# Patient Record
Sex: Male | Born: 1950 | Race: White | Hispanic: No | Smoking: Former smoker
Health system: Southern US, Community
[De-identification: ages and names within clinical notes are randomized; demographics above are authoritative.]

## PROBLEM LIST (undated history)

## (undated) DIAGNOSIS — I10 Essential (primary) hypertension: Secondary | ICD-10-CM

## (undated) DIAGNOSIS — J189 Pneumonia, unspecified organism: Secondary | ICD-10-CM

## (undated) DIAGNOSIS — K7030 Alcoholic cirrhosis of liver without ascites: Secondary | ICD-10-CM

## (undated) DIAGNOSIS — K7290 Hepatic failure, unspecified without coma: Secondary | ICD-10-CM

## (undated) HISTORY — PX: LIVER TRANSPLANT: SHX410

## (undated) HISTORY — PX: UMBILICAL HERNIA REPAIR: SHX196

---

## 2002-01-15 ENCOUNTER — Encounter (INDEPENDENT_AMBULATORY_CARE_PROVIDER_SITE_OTHER): Payer: Self-pay | Admitting: Specialist

## 2002-01-15 ENCOUNTER — Ambulatory Visit (HOSPITAL_COMMUNITY): Admission: RE | Admit: 2002-01-15 | Discharge: 2002-01-15 | Payer: Self-pay | Admitting: Gastroenterology

## 2009-04-23 ENCOUNTER — Encounter (HOSPITAL_COMMUNITY): Admission: RE | Admit: 2009-04-23 | Discharge: 2009-07-22 | Payer: Self-pay | Admitting: Family Medicine

## 2010-12-11 NOTE — Op Note (Signed)
[facility]  Patient:    Logan Patrick, Logan Patrick Visit Number: [id number] MRN: [id number]          Service Type: END Location: ENDO Attending Physician:  Rich Brave Dictated by:   Florencia Reasons, M.D. Proc. Date: 01/15/02 Admit Date:  01/15/2002   CC:         Delorse Lek, M.D.   Operative Report  PROCEDURE:  Colonoscopy with biopsies.  INDICATION:  This is a 60 year old gentleman for colon cancer screening.  FINDINGS:  Rare right-sided diverticulum, diminutive rectosigmoid polyps.  PROCEDURE:  The nature, purpose, risks of the procedure have been discussed with the patient who provided written consent. Digital examination of the prostate was normal. The Olympus adult video colonoscope was easily advanced to the cecum as identified by visualization of the ileocecal valve and appendiceal orifice, and pullback was then performed. The quality of the prep was very good except for a little bit of stool film coating some of the proximal colon which is not felt to likely have obscured any significant lesions.  There were 3 diminutive (2-4 mm) sessile, hyperplastic-appearing polyps in the rectosigmoid, from roughly 5 cm from the external anal opening up to 20 cm from the external anal opening, each removed by several cold biopsies. No large polyps, cancer, colitis, or vascular malformations were observed. There was some minimal right-sided diverticulosis.  Retroflex from the rectum as well as reinspection of the rectosigmoid disclosed no additional findings. The patient tolerated the procedure well, and there were no apparent complications.  IMPRESSION:  Diminutive rectosigmoid polyps. Minimal right-sided diverticulosis.  PLAN:  Await pathology on polyps. Dictated by:   Florencia Reasons, M.D. Attending Physician:  Rich Brave DD:  01/15/02 TD:  01/16/02 Job: [id number]

## 2012-11-30 ENCOUNTER — Emergency Department (HOSPITAL_COMMUNITY): Payer: BC Managed Care – PPO

## 2012-11-30 ENCOUNTER — Encounter (HOSPITAL_COMMUNITY): Payer: Self-pay | Admitting: Emergency Medicine

## 2012-11-30 ENCOUNTER — Inpatient Hospital Stay (HOSPITAL_COMMUNITY)
Admission: EM | Admit: 2012-11-30 | Discharge: 2012-12-13 | DRG: 588 | Disposition: A | Discharge status: Skilled Nursing Facility | Payer: BC Managed Care – PPO | Attending: Internal Medicine | Admitting: Internal Medicine

## 2012-11-30 DIAGNOSIS — D696 Thrombocytopenia, unspecified: Secondary | ICD-10-CM | POA: Diagnosis present

## 2012-11-30 DIAGNOSIS — N179 Acute kidney failure, unspecified: Secondary | ICD-10-CM | POA: Diagnosis present

## 2012-11-30 DIAGNOSIS — J209 Acute bronchitis, unspecified: Principal | ICD-10-CM | POA: Diagnosis present

## 2012-11-30 DIAGNOSIS — R188 Other ascites: Secondary | ICD-10-CM | POA: Diagnosis present

## 2012-11-30 DIAGNOSIS — Z87891 Personal history of nicotine dependence: Secondary | ICD-10-CM

## 2012-11-30 DIAGNOSIS — N289 Disorder of kidney and ureter, unspecified: Secondary | ICD-10-CM | POA: Diagnosis not present

## 2012-11-30 DIAGNOSIS — IMO0002 Reserved for concepts with insufficient information to code with codable children: Secondary | ICD-10-CM | POA: Diagnosis present

## 2012-11-30 DIAGNOSIS — J40 Bronchitis, not specified as acute or chronic: Secondary | ICD-10-CM | POA: Diagnosis present

## 2012-11-30 DIAGNOSIS — K7290 Hepatic failure, unspecified without coma: Secondary | ICD-10-CM | POA: Diagnosis present

## 2012-11-30 DIAGNOSIS — K7460 Unspecified cirrhosis of liver: Secondary | ICD-10-CM

## 2012-11-30 DIAGNOSIS — I10 Essential (primary) hypertension: Secondary | ICD-10-CM | POA: Diagnosis present

## 2012-11-30 DIAGNOSIS — D684 Acquired coagulation factor deficiency: Secondary | ICD-10-CM | POA: Diagnosis present

## 2012-11-30 DIAGNOSIS — G9340 Encephalopathy, unspecified: Secondary | ICD-10-CM | POA: Diagnosis present

## 2012-11-30 DIAGNOSIS — F1020 Alcohol dependence, uncomplicated: Secondary | ICD-10-CM | POA: Diagnosis present

## 2012-11-30 DIAGNOSIS — Z79899 Other long term (current) drug therapy: Secondary | ICD-10-CM

## 2012-11-30 DIAGNOSIS — K769 Liver disease, unspecified: Secondary | ICD-10-CM

## 2012-11-30 DIAGNOSIS — R319 Hematuria, unspecified: Secondary | ICD-10-CM | POA: Diagnosis present

## 2012-11-30 DIAGNOSIS — K7030 Alcoholic cirrhosis of liver without ascites: Secondary | ICD-10-CM | POA: Diagnosis present

## 2012-11-30 DIAGNOSIS — R627 Adult failure to thrive: Secondary | ICD-10-CM | POA: Diagnosis present

## 2012-11-30 DIAGNOSIS — E876 Hypokalemia: Secondary | ICD-10-CM | POA: Diagnosis present

## 2012-11-30 DIAGNOSIS — R531 Weakness: Secondary | ICD-10-CM

## 2012-11-30 DIAGNOSIS — M79641 Pain in right hand: Secondary | ICD-10-CM

## 2012-11-30 DIAGNOSIS — D649 Anemia, unspecified: Secondary | ICD-10-CM | POA: Diagnosis present

## 2012-11-30 HISTORY — DX: Essential (primary) hypertension: I10

## 2012-11-30 HISTORY — DX: Hepatic failure, unspecified without coma: K72.90

## 2012-11-30 HISTORY — DX: Bronchitis, not specified as acute or chronic: J40

## 2012-11-30 HISTORY — DX: Unspecified cirrhosis of liver: K74.60

## 2012-11-30 HISTORY — DX: Pneumonia, unspecified organism: J18.9

## 2012-11-30 LAB — CBC WITH DIFFERENTIAL/PLATELET
Basophils Relative: 0 % (ref 0–1)
Eosinophils Absolute: 0.2 10*3/uL (ref 0.0–0.7)
Eosinophils Relative: 2 % (ref 0–5)
HCT: 27.6 % — ABNORMAL LOW (ref 39.0–52.0)
Hemoglobin: 9.4 g/dL — ABNORMAL LOW (ref 13.0–17.0)
Lymphocytes Relative: 19 % (ref 12–46)
MCHC: 34.1 g/dL (ref 30.0–36.0)
Monocytes Relative: 10 % (ref 3–12)
Neutro Abs: 5.6 10*3/uL (ref 1.7–7.7)
Neutrophils Relative %: 69 % (ref 43–77)
RBC: 2.72 MIL/uL — ABNORMAL LOW (ref 4.22–5.81)
WBC: 8.1 10*3/uL (ref 4.0–10.5)

## 2012-11-30 LAB — PROTIME-INR
INR: 1.56 — ABNORMAL HIGH (ref 0.00–1.49)
Prothrombin Time: 18.2 seconds — ABNORMAL HIGH (ref 11.6–15.2)

## 2012-11-30 LAB — URINALYSIS, ROUTINE W REFLEX MICROSCOPIC
Bilirubin Urine: NEGATIVE
Glucose, UA: NEGATIVE mg/dL
Specific Gravity, Urine: 1.013 (ref 1.005–1.030)
pH: 6 (ref 5.0–8.0)

## 2012-11-30 LAB — COMPREHENSIVE METABOLIC PANEL
ALT: 30 U/L (ref 0–53)
AST: 101 U/L — ABNORMAL HIGH (ref 0–37)
Albumin: 2.1 g/dL — ABNORMAL LOW (ref 3.5–5.2)
CO2: 25 mEq/L (ref 19–32)
Calcium: 9.6 mg/dL (ref 8.4–10.5)
GFR calc non Af Amer: 89 mL/min — ABNORMAL LOW (ref >90)
Sodium: 140 mEq/L (ref 135–145)
Total Protein: 6.7 g/dL (ref 6.0–8.3)

## 2012-11-30 LAB — APTT: aPTT: 35 seconds (ref 24–37)

## 2012-11-30 LAB — CG4 I-STAT (LACTIC ACID): Lactic Acid, Venous: 1.53 mmol/L (ref 0.5–2.2)

## 2012-11-30 LAB — POCT I-STAT TROPONIN I: Troponin i, poc: 0 ng/mL (ref 0.00–0.08)

## 2012-11-30 MED ORDER — LEVOFLOXACIN IN D5W 750 MG/150ML IV SOLN
750.0000 mg | INTRAVENOUS | Status: DC
  Administered 2012-12-01 – 2012-12-03 (×3): 750 mg via INTRAVENOUS
  Filled 2012-11-30 (×3): qty 150

## 2012-11-30 MED ORDER — POTASSIUM CHLORIDE CRYS ER 20 MEQ PO TBCR
20.0000 meq | EXTENDED_RELEASE_TABLET | Freq: Once | ORAL | Status: AC
  Administered 2012-11-30: 20 meq via ORAL
  Filled 2012-11-30: qty 1

## 2012-11-30 MED ORDER — ALBUTEROL SULFATE (5 MG/ML) 0.5% IN NEBU
5.0000 mg | INHALATION_SOLUTION | Freq: Once | RESPIRATORY_TRACT | Status: AC
  Administered 2012-11-30: 5 mg via RESPIRATORY_TRACT
  Filled 2012-11-30: qty 1

## 2012-11-30 MED ORDER — IPRATROPIUM BROMIDE 0.02 % IN SOLN
0.5000 mg | Freq: Once | RESPIRATORY_TRACT | Status: AC
  Administered 2012-11-30: 0.5 mg via RESPIRATORY_TRACT
  Filled 2012-11-30: qty 2.5

## 2012-11-30 NOTE — ED Notes (Signed)
Pt cannot void at this time.  Has been made aware of need for sample.

## 2012-11-30 NOTE — ED Notes (Signed)
Patient said, "my daughter decided she couldn't keep me."  The patient says he feels fine and denies any pain.  Patient does have severe bruising to his torso and it is documented in the physical diagram.  The patient does say he thinks it is from his liver failure.

## 2012-11-30 NOTE — ED Notes (Signed)
According to EMS, the patient was treated at [facility] for pneumonia and sent home.  The daughter advised EMS that he was weaker now and she wanted him to come here and be evaluated by a different facility.  The patient does have liver failure and has pitting edema to lower extremities.  Patient is jaundice and has bruising all over.  Patient denies SOB, but is coughing.  EMS auscultated lungs and did hear some congestion.

## 2012-11-30 NOTE — H&P (Signed)
[facility] History and Physical  Logan Patrick WUJ:[id number] DOB: 10-05-1950 DOA: 11/30/2012  Referring physician: Dr. Anitra Lauth. PCP: Default, Provider, MD   Chief Complaint: Was brought to the ER by patient's daughter as patient was found to be increasingly difficult to be managed in house.  HPI: Logan Patrick is a 62 y.o. male was brought in by daughter today because daughter found increasingly difficult to manage patient at home. Patient was recently admitted last month to [place] county and after discharge patient was brought by patient's daughter to her house. But since patient has been feeling weak and patient's daughter found it increasingly difficult to to manage him. Patient on exam was complaining of increasing productive cough over the last one week. Denies any fever chills nausea vomiting chest pain abdominal pain or diarrhea. Denies any blood in the stools. Has noticed some dark urine. 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 Patient states that since his last admission last month he has stopped taking alcohol. Patient will be admitted for further observation.  Review of Systems: As presented in the history of presenting illness, rest negative.  Past Medical History  Diagnosis Date  . Liver failure   . Hypertension   . Pneumonia    History reviewed. No pertinent past surgical history. Social History:  reports that he quit smoking about 36 years ago. He has never used smokeless tobacco. He reports that he does not drink alcohol or use illicit drugs. Lives at home. where does patient live-- Not sure. Can patient participate in ADLs?  No Known Allergies  Family History  Problem Relation Age of Onset  . Diabetes type II Mother       Prior to Admission medications   Medication Sig Start Date End Date Taking? Authorizing Provider  albuterol-ipratropium (COMBIVENT) 18-103 MCG/ACT inhaler Inhale 2 puffs into the lungs  every 6 (six) hours as needed for wheezing. For wheezing   Yes Historical Provider, MD  B Complex Vitamins (B COMPLEX-B12 PO) Take 1 tablet by mouth daily.   Yes Historical Provider, MD  clotrimazole (LOTRIMIN) 1 % cream Apply 1 application topically 2 (two) times daily. Apply to facial rash-avoid eyes   Yes Historical Provider, MD  Fluocinolone Acetonide 0.01 % OIL Place 5 drops into the left ear 2 (two) times daily.   Yes Historical Provider, MD  lactulose (CHRONULAC) 10 GM/15ML solution Take 30 g by mouth 3 (three) times daily.   Yes Historical Provider, MD  magnesium oxide (MAG-OX) 400 MG tablet Take 400 mg by mouth See admin instructions. Take 1 tab 2 times daily x5 days then 1 tab daily thereafter   Yes Historical Provider, MD  pantoprazole (PROTONIX) 40 MG tablet Take 40 mg by mouth 2 (two) times daily.   Yes Historical Provider, MD  potassium chloride (K-DUR) 10 MEQ tablet Take 10 mEq by mouth daily.   Yes Historical Provider, MD  rifaximin (XIFAXAN) 550 MG TABS Take 550 mg by mouth 2 (two) times daily.   Yes Historical Provider, MD   Physical Exam: Filed Vitals:   11/30/12 1925 11/30/12 2000 11/30/12 2008 11/30/12 2218  BP: 117/79 121/81  128/80  Pulse:  92    Temp: 98.4 F (36.9 C)     TempSrc: Oral     Resp: 17 21  16   SpO2: 97% 96% 100% 96%     General:  Well-developed and nourished.  Eyes: Mild icterus no pallor.  ENT: No discharge from ears eyes  nose or mouth.  Neck: No mass felt.  Cardiovascular: S1-S2 heard.  Respiratory: No rhonchi or crepitations.  Abdomen: Mildly distended. Soft. Bowel sounds present.  Skin: Ecchymotic areas on the chest.  Musculoskeletal: Bilateral lower extremity edema.  Psychiatric: Appears normal.  Neurologic: Alert awake oriented to time place and person. Moves all extremities.  Labs on Admission:  Basic Metabolic Panel:  Recent Labs Lab 11/30/12 1951  NA 140  K 3.3*  CL 107  CO2 25  GLUCOSE 114*  BUN 16  CREATININE  0.93  CALCIUM 9.6   Liver Function Tests:  Recent Labs Lab 11/30/12 1951  AST 101*  ALT 30  ALKPHOS 95  BILITOT 5.0*  PROT 6.7  ALBUMIN 2.1*   No results found for this basename: LIPASE, AMYLASE,  in the last 168 hours  Recent Labs Lab 11/30/12 1952  AMMONIA 40   CBC:  Recent Labs Lab 11/30/12 1951  WBC 8.1  NEUTROABS 5.6  HGB 9.4*  HCT 27.6*  MCV 101.5*  PLT 124*   Cardiac Enzymes: No results found for this basename: CKTOTAL, CKMB, CKMBINDEX, TROPONINI,  in the last 168 hours  BNP (last 3 results) No results found for this basename: PROBNP,  in the last 8760 hours CBG: No results found for this basename: GLUCAP,  in the last 168 hours  Radiological Exams on Admission: Dg Chest 2 View  11/30/2012  *RADIOLOGY REPORT*  Clinical Data:  62 year old male with cough, shortness of breath, jaundice.  CHEST - 2 VIEW  Comparison: 04/15/2009 and earlier.  Findings:  Lower lung volumes.  Mild streaky opacity at the lung bases most resembles atelectasis.  Cardiac size and mediastinal contours are within normal limits.  Visualized tracheal air column is within normal limits.  No pneumothorax, pulmonary edema or pleural effusion. No acute osseous abnormality identified.  IMPRESSION: Atelectasis, otherwise no acute cardiopulmonary abnormality.     Original Report Authenticated By: Erskine Speed, M.D.     Assessment/Plan Principal Problem:   Bronchitis Active Problems:   Cirrhosis of liver   Hematuria   Anemia   1. Bronchitis and hematuria - patient has been placed on empiric antibiotics. Check urine cultures. Patient may need further workup for his hematuria as outpatient. 2. Difficult home situation - social work consult has been requested. 3. Cirrhosis of liver with most likely secondary to alcoholism - patient does have bilateral lower extremity edema and jaundice all of which could be from cirrhosis. At this time I have requested Dopplers of the lower extremity to rule out  DVT. Patient may need Lasix and spironolactone. Closely follow metabolic panel. 4. Anemia - closely follow CBC. Except for mild hematuria patient does not complain of any obvious bleed.    Code Status: Full code.  Family Communication: None.  Disposition Plan: Admit to inpatient.    Johncharles Fusselman N. [facility] Pager [phone].  If 7PM-7AM, please contact night-coverage [email or web address] Password [facility] 11/30/2012, 11:39 PM

## 2012-11-30 NOTE — ED Provider Notes (Signed)
History     CSN: [id number]  Arrival date & time 11/30/12  1908   First MD Initiated Contact with Patient 11/30/12 1912      Chief Complaint  Patient presents with  . Pneumonia    (Consider location/radiation/quality/duration/timing/severity/associated sxs/prior treatment) HPI Comments: Patient states he is a chronic alcoholic last drink was about one month ago. He also notes that he has liver failure and states that he was recently hospitalized and another town in [place] and because he could not take care of himself was discharged to live with his daughter. He states his daughter cannot take care of him. Also he states he's developed a worsening cough and someone told him he had pneumonia.  Patient is a 62 y.o. male presenting with cough. The history is provided by the patient.  Cough Cough characteristics:  Non-productive Severity:  Moderate Onset quality:  Gradual Duration:  3 days Timing:  Constant Progression:  Worsening Chronicity:  New Smoker: no   Context comment:  States recently in the hospital somewhere else and d/ced home yesterday with daughter who possible took him to urgent care today and they ddx him with pna.  pt currently on no abx. Relieved by:  Nothing Worsened by:  Activity Ineffective treatments:  None tried Associated symptoms: shortness of breath   Associated symptoms: no chest pain, no chills, no fever and no wheezing   Associated symptoms comment:  Leg swelling   Past Medical History  Diagnosis Date  . Liver failure   . Hypertension   . Pneumonia     History reviewed. No pertinent past surgical history.  History reviewed. No pertinent family history.  History  Substance Use Topics  . Smoking status: Former Smoker    Quit date: 11/30/1976  . Smokeless tobacco: Never Used  . Alcohol Use: No      Review of Systems  Constitutional: Negative for fever and chills.  Respiratory: Positive for cough and shortness of breath. Negative  for wheezing.   Cardiovascular: Negative for chest pain.  All other systems reviewed and are negative.    Allergies  Review of patient's allergies indicates no known allergies.  Home Medications  No current outpatient prescriptions on file.  BP 117/79  Temp(Src) 98.4 F (36.9 C) (Oral)  Resp 17  SpO2 97%  Physical Exam  Nursing note and vitals reviewed. Constitutional: He is oriented to person, place, and time. He appears well-developed and well-nourished. No distress.  HENT:  Head: Normocephalic and atraumatic.  Mouth/Throat: Oropharynx is clear and moist.  Eyes: Conjunctivae and EOM are normal. Pupils are equal, round, and reactive to light. Scleral icterus is present.  Neck: Normal range of motion. Neck supple.  Cardiovascular: Normal rate, regular rhythm and intact distal pulses.   No murmur heard. Pulmonary/Chest: Effort normal. No respiratory distress. He has no decreased breath sounds. He has wheezes. He has rhonchi. He has no rales. He exhibits tenderness. He exhibits no crepitus.    A large ecchymosis in various stages covering the entire chest and upper abdomen  Abdominal: Soft. He exhibits no distension. There is no tenderness. There is no rebound and no guarding.  No significant ascites  Musculoskeletal: Normal range of motion. He exhibits edema. He exhibits no tenderness.  3+ pitting edema up to the midshin  Neurological: He is alert and oriented to person, place, and time.  Skin: Skin is warm and dry. No rash noted. No erythema.  Petechiae scattered over the lower and upper extremities. Jaundice of the  skin  Psychiatric: He has a normal mood and affect. His behavior is normal.    ED Course  Procedures (including critical care time)  Labs Reviewed  CBC WITH DIFFERENTIAL - Abnormal; Notable for the following:    RBC 2.72 (*)    Hemoglobin 9.4 (*)    HCT 27.6 (*)    MCV 101.5 (*)    MCH 34.6 (*)    RDW 21.5 (*)    Platelets 124 (*)    All other  components within normal limits  COMPREHENSIVE METABOLIC PANEL - Abnormal; Notable for the following:    Potassium 3.3 (*)    Glucose, Bld 114 (*)    Albumin 2.1 (*)    Total Bilirubin 5.0 (*)    GFR calc non Af Amer 89 (*)    All other components within normal limits  URINALYSIS, ROUTINE W REFLEX MICROSCOPIC - Abnormal; Notable for the following:    APPearance CLOUDY (*)    Hgb urine dipstick LARGE (*)    All other components within normal limits  PROTIME-INR - Abnormal; Notable for the following:    Prothrombin Time 18.2 (*)    INR 1.56 (*)    All other components within normal limits  APTT  AMMONIA  URINE MICROSCOPIC-ADD ON  CG4 I-STAT (LACTIC ACID)  POCT I-STAT TROPONIN I   Dg Chest 2 View  11/30/2012  *RADIOLOGY REPORT*  Clinical Data:  62 year old male with cough, shortness of breath, jaundice.  CHEST - 2 VIEW  Comparison: 04/15/2009 and earlier.  Findings:  Lower lung volumes.  Mild streaky opacity at the lung bases most resembles atelectasis.  Cardiac size and mediastinal contours are within normal limits.  Visualized tracheal air column is within normal limits.  No pneumothorax, pulmonary edema or pleural effusion. No acute osseous abnormality identified.  IMPRESSION: Atelectasis, otherwise no acute cardiopulmonary abnormality.     Original Report Authenticated By: Erskine Speed, M.D.      Date: 11/30/2012  Rate: 93  Rhythm: normal sinus rhythm  QRS Axis: normal  Intervals: normal  ST/T Wave abnormalities: normal  Conduction Disutrbances: none  Narrative Interpretation: unremarkable      No diagnosis found.    MDM   Patient is chronically ill appearing with a history of alcoholic liver failure who apparently was hospitalized somewhere else in [place] and was discharged to live with his daughter 12 days ago. He states he started to have shortness of breath and cough and was told he had pneumonia. He does not know he's on any antibiotics. However he states it  starts cannot take care of him and sent him here for further evaluation. Patient has evidence of chronic cirrhosis with jaundice, pitting edema, spider angiomata and lower limb edema. He is awake and alert and has no signs of asterixis. He has coarse breath sounds throughout but worse on the right.  Concern for new infection versus fluid overload from his underlying illnesses. CBC, CMP, UA, PT/INR, ammonia, troponin, chest x-ray pending. EKG within normal limits   9:49 PM No acute findings on lab tests today except for elevated bilirubin and hemoglobin of 9 which I feel is most likely anemia of chronic disease and not acute. X-ray without signs of pneumonia but more atelectasis. After albuterol and Atrovent patient's symptoms are improved. However patient states he cannot take care of himself at home and needs rehabilitation.     Gwyneth Sprout, MD 11/30/12 2150

## 2012-12-01 ENCOUNTER — Inpatient Hospital Stay (HOSPITAL_COMMUNITY): Payer: BC Managed Care – PPO

## 2012-12-01 DIAGNOSIS — R627 Adult failure to thrive: Secondary | ICD-10-CM

## 2012-12-01 DIAGNOSIS — R5381 Other malaise: Secondary | ICD-10-CM

## 2012-12-01 DIAGNOSIS — K7460 Unspecified cirrhosis of liver: Secondary | ICD-10-CM

## 2012-12-01 DIAGNOSIS — J40 Bronchitis, not specified as acute or chronic: Secondary | ICD-10-CM

## 2012-12-01 DIAGNOSIS — R609 Edema, unspecified: Secondary | ICD-10-CM

## 2012-12-01 LAB — CBC
HCT: 28.3 % — ABNORMAL LOW (ref 39.0–52.0)
MCV: 102.5 fL — ABNORMAL HIGH (ref 78.0–100.0)
Platelets: 120 10*3/uL — ABNORMAL LOW (ref 150–400)
RBC: 2.76 MIL/uL — ABNORMAL LOW (ref 4.22–5.81)
RDW: 21.1 % — ABNORMAL HIGH (ref 11.5–15.5)
WBC: 6.8 10*3/uL (ref 4.0–10.5)

## 2012-12-01 LAB — HEPATIC FUNCTION PANEL
Bilirubin, Direct: 2.4 mg/dL — ABNORMAL HIGH (ref 0.0–0.3)
Indirect Bilirubin: 3 mg/dL — ABNORMAL HIGH (ref 0.3–0.9)
Total Protein: 6.7 g/dL (ref 6.0–8.3)

## 2012-12-01 LAB — BASIC METABOLIC PANEL
CO2: 26 mEq/L (ref 19–32)
Chloride: 106 mEq/L (ref 96–112)
GFR calc Af Amer: >90 mL/min (ref >90)
Sodium: 141 mEq/L (ref 135–145)

## 2012-12-01 MED ORDER — CLOTRIMAZOLE 1 % EX CREA
1.0000 "application " | TOPICAL_CREAM | Freq: Two times a day (BID) | CUTANEOUS | Status: DC
  Administered 2012-12-01 – 2012-12-05 (×5): 1 via TOPICAL
  Filled 2012-12-01 (×2): qty 15

## 2012-12-01 MED ORDER — PANTOPRAZOLE SODIUM 40 MG PO TBEC
40.0000 mg | DELAYED_RELEASE_TABLET | Freq: Two times a day (BID) | ORAL | Status: DC
  Administered 2012-12-01 – 2012-12-13 (×26): 40 mg via ORAL
  Filled 2012-12-01 (×25): qty 1

## 2012-12-01 MED ORDER — ACETAMINOPHEN 325 MG PO TABS
650.0000 mg | ORAL_TABLET | Freq: Four times a day (QID) | ORAL | Status: DC | PRN
  Administered 2012-12-09: 650 mg via ORAL
  Filled 2012-12-01: qty 1
  Filled 2012-12-01 (×2): qty 2

## 2012-12-01 MED ORDER — MAGNESIUM OXIDE 400 (241.3 MG) MG PO TABS
400.0000 mg | ORAL_TABLET | Freq: Every day | ORAL | Status: DC
  Administered 2012-12-01 – 2012-12-13 (×13): 400 mg via ORAL
  Filled 2012-12-01 (×13): qty 1

## 2012-12-01 MED ORDER — ONDANSETRON HCL 4 MG PO TABS: 4.0000 mg | ORAL_TABLET | Freq: Four times a day (QID) | ORAL | Status: DC | PRN

## 2012-12-01 MED ORDER — ONDANSETRON HCL 4 MG/2ML IJ SOLN: 4.0000 mg | Freq: Four times a day (QID) | INTRAMUSCULAR | Status: DC | PRN

## 2012-12-01 MED ORDER — LACTULOSE 10 GM/15ML PO SOLN
30.0000 g | Freq: Three times a day (TID) | ORAL | Status: DC
  Administered 2012-12-01 – 2012-12-02 (×4): 30 g via ORAL
  Filled 2012-12-01 (×6): qty 45

## 2012-12-01 MED ORDER — IPRATROPIUM-ALBUTEROL 18-103 MCG/ACT IN AERO
2.0000 | INHALATION_SPRAY | Freq: Four times a day (QID) | RESPIRATORY_TRACT | Status: DC | PRN
  Administered 2012-12-02: 2 via RESPIRATORY_TRACT
  Filled 2012-12-01: qty 14.7

## 2012-12-01 MED ORDER — VITAMIN B-1 100 MG PO TABS
100.0000 mg | ORAL_TABLET | Freq: Every day | ORAL | Status: DC
  Administered 2012-12-01 – 2012-12-13 (×13): 100 mg via ORAL
  Filled 2012-12-01 (×13): qty 1

## 2012-12-01 MED ORDER — FLUOCINOLONE ACETONIDE 0.01 % OT OIL
5.0000 [drp] | TOPICAL_OIL | Freq: Two times a day (BID) | OTIC | Status: DC
  Administered 2012-12-01: 21:00:00 via OTIC

## 2012-12-01 MED ORDER — FUROSEMIDE 10 MG/ML IJ SOLN
40.0000 mg | Freq: Every day | INTRAMUSCULAR | Status: DC
  Administered 2012-12-01 – 2012-12-05 (×5): 40 mg via INTRAVENOUS
  Filled 2012-12-01 (×6): qty 4

## 2012-12-01 MED ORDER — SPIRONOLACTONE 100 MG PO TABS
100.0000 mg | ORAL_TABLET | Freq: Every day | ORAL | Status: DC
  Administered 2012-12-01 – 2012-12-07 (×7): 100 mg via ORAL
  Filled 2012-12-01 (×8): qty 1

## 2012-12-01 MED ORDER — POTASSIUM CHLORIDE ER 10 MEQ PO TBCR
10.0000 meq | EXTENDED_RELEASE_TABLET | Freq: Every day | ORAL | Status: DC
  Administered 2012-12-01: 10 meq via ORAL
  Filled 2012-12-01 (×2): qty 1

## 2012-12-01 MED ORDER — SODIUM CHLORIDE 0.9 % IJ SOLN
3.0000 mL | Freq: Two times a day (BID) | INTRAMUSCULAR | Status: DC
Start: 2012-12-01 — End: 2012-12-13
  Administered 2012-12-01 – 2012-12-05 (×8): 3 mL via INTRAVENOUS

## 2012-12-01 MED ORDER — ACETAMINOPHEN 650 MG RE SUPP: 650.0000 mg | Freq: Four times a day (QID) | RECTAL | Status: DC | PRN

## 2012-12-01 MED ORDER — RIFAXIMIN 550 MG PO TABS
550.0000 mg | ORAL_TABLET | Freq: Two times a day (BID) | ORAL | Status: DC
  Administered 2012-12-01 – 2012-12-13 (×26): 550 mg via ORAL
  Filled 2012-12-01 (×27): qty 1

## 2012-12-01 MED ORDER — OXYCODONE HCL 5 MG PO TABS
5.0000 mg | ORAL_TABLET | Freq: Once | ORAL | Status: AC
  Administered 2012-12-01: 5 mg via ORAL
  Filled 2012-12-01: qty 1

## 2012-12-01 MED ORDER — PHYTONADIONE 5 MG PO TABS
5.0000 mg | ORAL_TABLET | Freq: Once | ORAL | Status: AC
  Administered 2012-12-01: 5 mg via ORAL
  Filled 2012-12-01: qty 1

## 2012-12-01 NOTE — Progress Notes (Signed)
*  PRELIMINARY RESULTS* Vascular Ultrasound Lower extremity venous duplex has been completed.  Preliminary findings: Bilateral:  No evidence of DVT.  Bilateral baker's cyst noted.    Farrel Demark, RDMS, RVT  12/01/2012, 9:01 AM

## 2012-12-01 NOTE — Care Management Note (Signed)
   CARE MANAGEMENT NOTE 12/01/2012  Patient:  Logan Patrick, Logan Patrick   Account Number:  [id number]  Date Initiated:  12/01/2012  Documentation initiated by:  Reine Bristow  Subjective/Objective Assessment:   Order for possible placement.     Action/Plan:   Met with pt who is agreeable to short term rehab, await PT eval for possible rehab.   Anticipated DC Date:  12/03/2012   Anticipated DC Plan:  SKILLED NURSING FACILITY         Choice offered to / List presented to:             Status of service:  In process, will continue to follow Medicare Important Message given?   (If response is "NO", the following Medicare IM given date fields will be blank) Date Medicare IM given:   Date Additional Medicare IM given:    Discharge Disposition:    Per UR Regulation:    If discussed at Long Length of Stay Meetings, dates discussed:    Comments:

## 2012-12-01 NOTE — Evaluation (Signed)
Physical Therapy Evaluation Patient Details Name: KALIB BHAGAT MRN: [id number] DOB: [birth year] Today's Date: 12/01/2012 Time: 4098-1191 PT Time Calculation (min): 27 min  PT Assessment / Plan / Recommendation Clinical Impression  Patient is a 62 yo male admitted with bronchitis.  Patient presents with general weakness, decreased safety awareness impacting functional mobility.  Will benefit from acute PT to maximize independence prior to discharge.  Recommend SNF for continued therapy at discharge.    PT Assessment  Patient needs continued PT services    Follow Up Recommendations  SNF    Does the patient have the potential to tolerate intense rehabilitation      Barriers to Discharge Decreased caregiver support      Equipment Recommendations  None recommended by PT    Recommendations for Other Services     Frequency Min 3X/week    Precautions / Restrictions Precautions Precautions: Fall Restrictions Weight Bearing Restrictions: No   Pertinent Vitals/Pain       Mobility  Bed Mobility Bed Mobility: Supine to Sit;Sitting - Scoot to Edge of Bed;Sit to Supine Supine to Sit: 3: Mod assist;With rails;HOB flat Sitting - Scoot to Edge of Bed: 4: Min guard Sit to Supine: 4: Min guard;HOB flat Details for Bed Mobility Assistance: Verbal cues for technique.  Assist to raise trunk from bed. Transfers Transfers: Sit to Stand;Stand to Sit Sit to Stand: 4: Min assist;With upper extremity assist;From bed Stand to Sit: 4: Min guard;With upper extremity assist;To bed Details for Transfer Assistance: Verbal cues for hand placement and safety.  Assist for balance. Ambulation/Gait Ambulation/Gait Assistance: 4: Min guard Ambulation Distance (Feet): 200 Feet Assistive device: Rolling walker Ambulation/Gait Assistance Details: Verbal cues to stay close to RW.  Cues to move at safe speed and for safe use of RW. Gait Pattern: Step-through pattern;Trunk rotated posteriorly on left Gait  velocity: Fast paced - Verbal cues to slow to safe speed    Exercises     PT Diagnosis: Abnormality of gait;Difficulty walking;Generalized weakness;Altered mental status  PT Problem List: Decreased strength;Decreased balance;Decreased mobility;Decreased cognition;Decreased safety awareness;Decreased knowledge of use of DME;Decreased knowledge of precautions PT Treatment Interventions: DME instruction;Gait training;Functional mobility training;Patient/family education;Cognitive remediation   PT Goals Acute Rehab PT Goals PT Goal Formulation: With patient Time For Goal Achievement: 12/08/12 Potential to Achieve Goals: Good Pt will go Supine/Side to Sit: with supervision;with HOB 0 degrees PT Goal: Supine/Side to Sit - Progress: Goal set today Pt will go Sit to Stand: with supervision;with upper extremity assist PT Goal: Sit to Stand - Progress: Goal set today Pt will Ambulate: >150 feet;with supervision;with rolling walker PT Goal: Ambulate - Progress: Goal set today  Visit Information  Last PT Received On: 12/01/12 Assistance Needed: +1    Subjective Data  Subjective: "I don't work, so I don't know what day it is" Patient Stated Goal: To get stronger   Prior Functioning  Home Living Lives With: Alone Available Help at Discharge: Skilled Nursing Facility Home Adaptive Equipment: Walker - rolling Prior Function Level of Independence: Independent;Needs assistance Needs Assistance: Meal Prep;Light Housekeeping Meal Prep: Moderate Light Housekeeping: Moderate Able to Take Stairs?: Yes Communication Communication: No difficulties    Cognition  Cognition Arousal/Alertness: Awake/alert Behavior During Therapy: Restless Overall Cognitive Status: History of cognitive impairments - at baseline (Decreased safety awareness)    Extremity/Trunk Assessment Right Upper Extremity Assessment RUE ROM/Strength/Tone: [facility] for tasks assessed Left Upper Extremity Assessment LUE  ROM/Strength/Tone: [facility] for tasks assessed Right Lower Extremity Assessment RLE ROM/Strength/Tone: [facility] for tasks  assessed Left Lower Extremity Assessment LLE ROM/Strength/Tone: [facility] for tasks assessed Trunk Assessment Trunk Assessment: Other exceptions Trunk Exceptions: General weakness; Bruising noted   Balance    End of Session PT - End of Session Equipment Utilized During Treatment: Gait belt Activity Tolerance: Patient tolerated treatment well Patient left: in bed;with call bell/phone within reach;with bed alarm set Nurse Communication: Mobility status (Decreased safety awareness)  GP     Vena Austria 12/01/2012, 7:13 PM Durenda Hurt. Renaldo Fiddler, [facility] Acute Rehab Services Pager [phone]

## 2012-12-01 NOTE — Progress Notes (Signed)
[facility] PROGRESS NOTE  Logan Patrick JXB:[id number] DOB: [birth year] DOA: 11/30/2012 PCP: Default, Provider, MD  Assessment/Plan: Decompensated cirrhosis of liver Associated ascites, jaundice, mild thrombocytopenia and coagulopathy. Secondary to etoh use  salt restricted diet Check US liver Check hepatitis panel Started on lasix and aldactone. Vitamin k for coagulopathy.  rifaximin and lactulose ordered on admission.  Patient abstinent from etoh only for past 6 wks GI Dr Elnoria Howard consulted.  Bronchitis On levaquin since admission  Social issue  daughter unable to take care of him at home. SW and PT consult placed  Code Status: full Family Communication:none at bedside Disposition Plan:PT and SW consult   Consultants:  Dr Elnoria Howard consulted  Procedures:  none  Antibiotics:  levaqin ( day 1)  HPI/Subjective: C/O fatigue and cough  Objective: Filed Vitals:   11/30/12 2300 12/01/12 0104 12/01/12 0500 12/01/12 0953  BP: 131/91 134/70 129/90 115/74  Pulse: 89 85 98 106  Temp:  98 F (36.7 C) 97.5 F (36.4 C) 98.3 F (36.8 C)  TempSrc:  Oral Oral   Resp: 21 20 20 19   SpO2: 97% 99% 99% 99%    Intake/Output Summary (Last 24 hours) at 12/01/12 1214 Last data filed at 12/01/12 0954  Gross per 24 hour  Intake    360 ml  Output    250 ml  Net    110 ml   There were no vitals filed for this visit.  Exam:   General:  62 year old male appears fatigued  HEENT: no pallor, moist mucosa, icteric  Chest: clear b/l, no added sounds, areas of ecchymosis  Cardiovascular: NS1&S2, no murmurs  Abdomen: mild distention, non tender, no hepatomegaly, areas of acchymoses  Musculoskeletal: warm pitting edema b/l   CNS: AAOX3, no tremors   Data Reviewed: Basic Metabolic Panel:  Recent Labs Lab 11/30/12 1951 12/01/12 0640  NA 140 141  K 3.3* 3.2*  CL 107 106  CO2 25 26  GLUCOSE 114* 84  BUN 16 14  CREATININE 0.93 0.89  CALCIUM 9.6 9.5   Liver Function  Tests:  Recent Labs Lab 11/30/12 1951 12/01/12 0640  AST 101* 83*  ALT 30 30  ALKPHOS 95 72  BILITOT 5.0* 5.4*  PROT 6.7 6.7  ALBUMIN 2.1* 2.1*   No results found for this basename: LIPASE, AMYLASE,  in the last 168 hours  Recent Labs Lab 11/30/12 1952  AMMONIA 40   CBC:  Recent Labs Lab 11/30/12 1951 12/01/12 0640  WBC 8.1 6.8  NEUTROABS 5.6  --   HGB 9.4* 9.5*  HCT 27.6* 28.3*  MCV 101.5* 102.5*  PLT 124* 120*   Cardiac Enzymes: No results found for this basename: CKTOTAL, CKMB, CKMBINDEX, TROPONINI,  in the last 168 hours BNP (last 3 results) No results found for this basename: PROBNP,  in the last 8760 hours CBG: No results found for this basename: GLUCAP,  in the last 168 hours  No results found for this or any previous visit (from the past 240 hour(s)).   Studies: Dg Chest 2 View  11/30/2012  *RADIOLOGY REPORT*  Clinical Data:  62 year old male with cough, shortness of breath, jaundice.  CHEST - 2 VIEW  Comparison: 04/15/2009 and earlier.  Findings:  Lower lung volumes.  Mild streaky opacity at the lung bases most resembles atelectasis.  Cardiac size and mediastinal contours are within normal limits.  Visualized tracheal air column is within normal limits.  No pneumothorax, pulmonary edema or pleural effusion. No acute osseous abnormality identified.  IMPRESSION: Atelectasis, otherwise no acute cardiopulmonary abnormality.     Original Report Authenticated By: Erskine Speed, M.D.     Scheduled Meds: . clotrimazole  1 application Topical BID  . Fluocinolone Acetonide  5 drop Left Ear BID  . lactulose  30 g Oral TID  . levofloxacin (LEVAQUIN) IV  750 mg Intravenous Q24H  . magnesium oxide  400 mg Oral Daily  . pantoprazole  40 mg Oral BID  . phytonadione  5 mg Oral Once  . potassium chloride  10 mEq Oral Daily  . rifaximin  550 mg Oral BID  . sodium chloride  3 mL Intravenous Q12H  . thiamine  100 mg Oral Daily   Continuous Infusions:     Time spent:  25 minutes    Brittlyn Cloe  [facility] Pager [phone]. If 7PM-7AM, please contact night-coverage at [email or web address], password [facility] 12/01/2012, 12:14 PM  LOS: 1 day

## 2012-12-01 NOTE — Consult Note (Signed)
Reason for Consult: ETOH cirrhosis Referring Physician: [facility]  Danae Chen HPI: This is a 62 year old male admitted for worsening clinical symptoms.  He was previously hospitalized at an outside hospital and then discharged to his daughter's care, however, she found it increasingly difficult to meet his medical needs.  Upon admission he was noted to have a distended abdomen, thrombocytopenia, and jaundice.  There is a significant ETOH history.  He states that he quit ETOH 8 weeks ago on his own volition.  He has a 40+ year history of drinking 2 glasses of Vodka or Scotch every evening.  Currently he reports that he feels well, but it is his daughter who does not think he can take care of himself.  Past Medical History  Diagnosis Date  . Liver failure   . Hypertension   . Pneumonia     History reviewed. No pertinent past surgical history.  Family History  Problem Relation Age of Onset  . Diabetes type II Mother     Social History:  reports that he quit smoking about 36 years ago. He has never used smokeless tobacco. He reports that he does not drink alcohol or use illicit drugs.  Allergies: No Known Allergies  Medications:  Scheduled: . clotrimazole  1 application Topical BID  . Fluocinolone Acetonide  5 drop Left Ear BID  . furosemide  40 mg Intravenous Daily  . lactulose  30 g Oral TID  . levofloxacin (LEVAQUIN) IV  750 mg Intravenous Q24H  . magnesium oxide  400 mg Oral Daily  . pantoprazole  40 mg Oral BID  . potassium chloride  10 mEq Oral Daily  . rifaximin  550 mg Oral BID  . sodium chloride  3 mL Intravenous Q12H  . spironolactone  100 mg Oral Daily  . thiamine  100 mg Oral Daily   Continuous:   Results for orders placed during the hospital encounter of 11/30/12 (from the past 24 hour(s))  CBC WITH DIFFERENTIAL     Status: Abnormal   Collection Time    11/30/12  7:51 PM      Result Value Range   WBC 8.1  4.0 - 10.5 K/uL   RBC 2.72 (*) 4.22 - 5.81  MIL/uL   Hemoglobin 9.4 (*) 13.0 - 17.0 g/dL   HCT 16.1 (*) 09.6 - 04.5 %   MCV 101.5 (*) 78.0 - 100.0 fL   MCH 34.6 (*) 26.0 - 34.0 pg   MCHC 34.1  30.0 - 36.0 g/dL   RDW 40.9 (*) 81.1 - 91.4 %   Platelets 124 (*) 150 - 400 K/uL   Neutrophils Relative 69  43 - 77 %   Lymphocytes Relative 19  12 - 46 %   Monocytes Relative 10  3 - 12 %   Eosinophils Relative 2  0 - 5 %   Basophils Relative 0  0 - 1 %   Neutro Abs 5.6  1.7 - 7.7 K/uL   Lymphs Abs 1.5  0.7 - 4.0 K/uL   Monocytes Absolute 0.8  0.1 - 1.0 K/uL   Eosinophils Absolute 0.2  0.0 - 0.7 K/uL   Basophils Absolute 0.0  0.0 - 0.1 K/uL   RBC Morphology POLYCHROMASIA PRESENT     WBC Morphology ATYPICAL LYMPHOCYTES     Smear Review LARGE PLATELETS PRESENT    COMPREHENSIVE METABOLIC PANEL     Status: Abnormal   Collection Time    11/30/12  7:51 PM  Result Value Range   Sodium 140  135 - 145 mEq/L   Potassium 3.3 (*) 3.5 - 5.1 mEq/L   Chloride 107  96 - 112 mEq/L   CO2 25  19 - 32 mEq/L   Glucose, Bld 114 (*) 70 - 99 mg/dL   BUN 16  6 - 23 mg/dL   Creatinine, Ser 1.61  0.50 - 1.35 mg/dL   Calcium 9.6  8.4 - 09.6 mg/dL   Total Protein 6.7  6.0 - 8.3 g/dL   Albumin 2.1 (*) 3.5 - 5.2 g/dL   AST 045 (*) 0 - 37 U/L   ALT 30  0 - 53 U/L   Alkaline Phosphatase 95  39 - 117 U/L   Total Bilirubin 5.0 (*) 0.3 - 1.2 mg/dL   GFR calc non Af Amer 89 (*) >90 mL/min   GFR calc Af Amer >90  >90 mL/min  PROTIME-INR     Status: Abnormal   Collection Time    11/30/12  7:51 PM      Result Value Range   Prothrombin Time 18.2 (*) 11.6 - 15.2 seconds   INR 1.56 (*) 0.00 - 1.49  APTT     Status: None   Collection Time    11/30/12  7:51 PM      Result Value Range   aPTT 35  24 - 37 seconds  AMMONIA     Status: None   Collection Time    11/30/12  7:52 PM      Result Value Range   Ammonia 40  11 - 60 umol/L  POCT I-STAT TROPONIN I     Status: None   Collection Time    11/30/12  8:08 PM      Result Value Range   Troponin i, poc  0.00  0.00 - 0.08 ng/mL   Comment 3           CG4 I-STAT (LACTIC ACID)     Status: None   Collection Time    11/30/12  8:11 PM      Result Value Range   Lactic Acid, Venous 1.53  0.5 - 2.2 mmol/L  URINALYSIS, ROUTINE W REFLEX MICROSCOPIC     Status: Abnormal   Collection Time    11/30/12  8:41 PM      Result Value Range   Color, Urine YELLOW  YELLOW   APPearance CLOUDY (*) CLEAR   Specific Gravity, Urine 1.013  1.005 - 1.030   pH 6.0  5.0 - 8.0   Glucose, UA NEGATIVE  NEGATIVE mg/dL   Hgb urine dipstick LARGE (*) NEGATIVE   Bilirubin Urine NEGATIVE  NEGATIVE   Ketones, ur NEGATIVE  NEGATIVE mg/dL   Protein, ur NEGATIVE  NEGATIVE mg/dL   Urobilinogen, UA 1.0  0.0 - 1.0 mg/dL   Nitrite NEGATIVE  NEGATIVE   Leukocytes, UA NEGATIVE  NEGATIVE  URINE MICROSCOPIC-ADD ON     Status: None   Collection Time    11/30/12  8:41 PM      Result Value Range   WBC, UA 0-2  <3 WBC/hpf   RBC / HPF 21-50  <3 RBC/hpf   Bacteria, UA RARE  RARE  HEPATIC FUNCTION PANEL     Status: Abnormal   Collection Time    12/01/12  6:40 AM      Result Value Range   Total Protein 6.7  6.0 - 8.3 g/dL   Albumin 2.1 (*) 3.5 - 5.2 g/dL   AST 83 (*) 0 -  37 U/L   ALT 30  0 - 53 U/L   Alkaline Phosphatase 72  39 - 117 U/L   Total Bilirubin 5.4 (*) 0.3 - 1.2 mg/dL   Bilirubin, Direct 2.4 (*) 0.0 - 0.3 mg/dL   Indirect Bilirubin 3.0 (*) 0.3 - 0.9 mg/dL  BASIC METABOLIC PANEL     Status: Abnormal   Collection Time    12/01/12  6:40 AM      Result Value Range   Sodium 141  135 - 145 mEq/L   Potassium 3.2 (*) 3.5 - 5.1 mEq/L   Chloride 106  96 - 112 mEq/L   CO2 26  19 - 32 mEq/L   Glucose, Bld 84  70 - 99 mg/dL   BUN 14  6 - 23 mg/dL   Creatinine, Ser 1.61  0.50 - 1.35 mg/dL   Calcium 9.5  8.4 - 09.6 mg/dL   GFR calc non Af Amer >90  >90 mL/min   GFR calc Af Amer >90  >90 mL/min  CBC     Status: Abnormal   Collection Time    12/01/12  6:40 AM      Result Value Range   WBC 6.8  4.0 - 10.5 K/uL   RBC 2.76  (*) 4.22 - 5.81 MIL/uL   Hemoglobin 9.5 (*) 13.0 - 17.0 g/dL   HCT 04.5 (*) 40.9 - 81.1 %   MCV 102.5 (*) 78.0 - 100.0 fL   MCH 34.4 (*) 26.0 - 34.0 pg   MCHC 33.6  30.0 - 36.0 g/dL   RDW 91.4 (*) 78.2 - 95.6 %   Platelets 120 (*) 150 - 400 K/uL     Dg Chest 2 View  11/30/2012  *RADIOLOGY REPORT*  Clinical Data:  62 year old male with cough, shortness of breath, jaundice.  CHEST - 2 VIEW  Comparison: 04/15/2009 and earlier.  Findings:  Lower lung volumes.  Mild streaky opacity at the lung bases most resembles atelectasis.  Cardiac size and mediastinal contours are within normal limits.  Visualized tracheal air column is within normal limits.  No pneumothorax, pulmonary edema or pleural effusion. No acute osseous abnormality identified.  IMPRESSION: Atelectasis, otherwise no acute cardiopulmonary abnormality.     Original Report Authenticated By: Erskine Speed, M.D.     ROS:  As stated above in the HPI otherwise negative.  Blood pressure 128/75, pulse 100, temperature 98.6 F (37 C), temperature source Oral, resp. rate 20, SpO2 96.00%.    PE: Gen: NAD, Alert and Oriented HEENT:  [place]/AT, EOMI Neck: Supple, no LAD Lungs: CTA Bilaterally CV: RRR without M/G/R ABM: Soft, NTND, +BS Ext: No C/C/E  Assessment/Plan: 1) ETOH cirrhosis. 2) Thrombocytopenia. 3) ETOH abuse - quit 8 weeks ago.   The patient was hospitalized in [place] as he lives in [place].  From my evaluation he appears to be stable, but there is a degree of decompensation with his cirrhosis.  He initially presented to [facility] for easy bruisability.  I presume he had imaging performed there as was he was told he had cirrhosis.  I cannot find any of there prior records if there are any here at [facility].  Without his daughter being present I cannot discern the degree of his disability or his lack of ability to care for himself.    Plan: 1) Continue with Lasix and Spironolactone. 2) Okay to use Xifaxan for now.  I do not  know if he has any significant issues with hepatic encephalopathy. 3) Social  work evaluation to assess his needs. 4) The patient can follow up as an outpatient in a month if he is still in [place].  Otherwise, he needs to follow up with a GI in [place].  Deejay Koppelman D 12/01/2012, 1:59 PM

## 2012-12-01 NOTE — Progress Notes (Signed)
Utilization review completed.  

## 2012-12-02 DIAGNOSIS — E876 Hypokalemia: Secondary | ICD-10-CM

## 2012-12-02 LAB — COMPREHENSIVE METABOLIC PANEL
AST: 65 U/L — ABNORMAL HIGH (ref 0–37)
Albumin: 2.1 g/dL — ABNORMAL LOW (ref 3.5–5.2)
Alkaline Phosphatase: 59 U/L (ref 39–117)
BUN: 12 mg/dL (ref 6–23)
CO2: 26 mEq/L (ref 19–32)
Chloride: 102 mEq/L (ref 96–112)
Creatinine, Ser: 1.05 mg/dL (ref 0.50–1.35)
GFR calc non Af Amer: 75 mL/min — ABNORMAL LOW (ref >90)
Potassium: 3 mEq/L — ABNORMAL LOW (ref 3.5–5.1)
Total Bilirubin: 5.3 mg/dL — ABNORMAL HIGH (ref 0.3–1.2)

## 2012-12-02 LAB — CBC
HCT: 26.4 % — ABNORMAL LOW (ref 39.0–52.0)
MCV: 101.5 fL — ABNORMAL HIGH (ref 78.0–100.0)
Platelets: 104 10*3/uL — ABNORMAL LOW (ref 150–400)
RBC: 2.6 MIL/uL — ABNORMAL LOW (ref 4.22–5.81)
RDW: 20.6 % — ABNORMAL HIGH (ref 11.5–15.5)
WBC: 7.1 10*3/uL (ref 4.0–10.5)

## 2012-12-02 LAB — PROTIME-INR: INR: 1.68 — ABNORMAL HIGH (ref 0.00–1.49)

## 2012-12-02 MED ORDER — FLUOCINOLONE ACETONIDE 0.01 % OT OIL
5.0000 [drp] | TOPICAL_OIL | Freq: Two times a day (BID) | OTIC | Status: DC
  Administered 2012-12-02 (×2): via OTIC
  Administered 2012-12-03: 5 [drp] via OTIC
  Administered 2012-12-03: 21:00:00 via OTIC
  Administered 2012-12-04 – 2012-12-08 (×9): 5 [drp] via OTIC

## 2012-12-02 MED ORDER — PHYTONADIONE 5 MG PO TABS
10.0000 mg | ORAL_TABLET | Freq: Once | ORAL | Status: AC
  Administered 2012-12-02: 10 mg via ORAL
  Filled 2012-12-02: qty 2

## 2012-12-02 MED ORDER — OXYCODONE HCL 5 MG PO TABS
5.0000 mg | ORAL_TABLET | Freq: Three times a day (TID) | ORAL | Status: DC | PRN
Start: 2012-12-02 — End: 2012-12-07
  Administered 2012-12-02: 5 mg via ORAL
  Filled 2012-12-02 (×2): qty 1

## 2012-12-02 MED ORDER — POTASSIUM CHLORIDE 20 MEQ/15ML (10%) PO LIQD
5.0000 meq | Freq: Every day | ORAL | Status: DC
  Filled 2012-12-02: qty 7.5

## 2012-12-02 MED ORDER — POTASSIUM CHLORIDE CRYS ER 20 MEQ PO TBCR
40.0000 meq | EXTENDED_RELEASE_TABLET | Freq: Every day | ORAL | Status: DC
  Administered 2012-12-02 – 2012-12-07 (×6): 40 meq via ORAL
  Filled 2012-12-02 (×7): qty 2

## 2012-12-02 MED ORDER — POTASSIUM CHLORIDE ER 10 MEQ PO TBCR
5.0000 meq | EXTENDED_RELEASE_TABLET | Freq: Every day | ORAL | Status: DC
  Filled 2012-12-02: qty 1

## 2012-12-02 NOTE — Progress Notes (Signed)
[facility] PROGRESS NOTE  Logan Patrick:[id number] DOB: October 31, 1950 DOA: 11/30/2012 PCP: Default, Provider, MD   Brief narrative: 62 y/o male with etoh cirrhosis brought in by daughter after she felt she was unable to take care of him at home. Patient noted to have mild decompensated cirrhosis and acute bronchitis.  Assessment/Plan:  Decompensated cirrhosis of liver  Associated ascites, jaundice, mild thrombocytopenia and coagulopathy.  Secondary to etoh use  salt restricted diet  -US liver shows cirrhosis -Acute  hepatitis panel  pending Started on lasix and aldactone. Vitamin k for coagulopathy.  rifaximin and lactulose ordered on admission.  Patient abstinent from etoh only for past 2 wks only. No signs fo withdrawal or encephaloapthy GI Dr Elnoria Howard consulted. Appreciate recommendations.  Bronchitis  On levaquin since admission   Hypokalemia  replenish   Social issue  daughter unable to take care of him at home. SW and PT consult placed . Recommend SNF  Code Status: full  Family Communication:daughter  at bedside  Disposition Plan SNF Consultants:  Dr Elnoria Howard consulted Procedures:  none Antibiotics:  levaqin ( day 2)   HPI/Subjective: Feels better today. Cough improved  Objective: Filed Vitals:   12/01/12 1647 12/01/12 2100 12/02/12 0512 12/02/12 0810  BP: 128/92 143/94 112/84 113/90  Pulse: 94 78 83 87  Temp: 97.9 F (36.6 C) 97.9 F (36.6 C) 98.2 F (36.8 C) 98.4 F (36.9 C)  TempSrc:  Oral Oral Oral  Resp: 18 18 18 18   Height:      Weight:  96.208 kg (212 lb 1.6 oz)    SpO2: 100% 100% 98% 96%    Intake/Output Summary (Last 24 hours) at 12/02/12 0918 Last data filed at 12/02/12 0900  Gross per 24 hour  Intake   1080 ml  Output   1725 ml  Net   -645 ml   Filed Weights   12/01/12 1500 12/01/12 2100  Weight: 95.709 kg (211 lb) 96.208 kg (212 lb 1.6 oz)    Exam:  General: Middle aged male appears fatigued  HEENT: no pallor, moist mucosa,  icteric Chest: clear b/l, no added sounds, areas of ecchymosis Cardiovascular: NS1&S2, no murmurs  Abdomen: mild distention, non tender, no hepatomegaly, areas of acchymoses  Musculoskeletal: warm ,trace pitting edema b/l  CNS: AAOX3, no tremors   Data Reviewed: Basic Metabolic Panel:  Recent Labs Lab 11/30/12 1951 12/01/12 0640 12/02/12 0500  NA 140 141 139  K 3.3* 3.2* 3.0*  CL 107 106 102  CO2 25 26 26   GLUCOSE 114* 84 78  BUN 16 14 12   CREATININE 0.93 0.89 1.05  CALCIUM 9.6 9.5 8.9   Liver Function Tests:  Recent Labs Lab 11/30/12 1951 12/01/12 0640 12/02/12 0500  AST 101* 83* 65*  ALT 30 30 27   ALKPHOS 95 72 59  BILITOT 5.0* 5.4* 5.3*  PROT 6.7 6.7 6.1  ALBUMIN 2.1* 2.1* 2.1*   No results found for this basename: LIPASE, AMYLASE,  in the last 168 hours  Recent Labs Lab 11/30/12 1952  AMMONIA 40   CBC:  Recent Labs Lab 11/30/12 1951 12/01/12 0640 12/02/12 0500  WBC 8.1 6.8 7.1  NEUTROABS 5.6  --   --   HGB 9.4* 9.5* 9.0*  HCT 27.6* 28.3* 26.4*  MCV 101.5* 102.5* 101.5*  PLT 124* 120* 104*   Cardiac Enzymes: No results found for this basename: CKTOTAL, CKMB, CKMBINDEX, TROPONINI,  in the last 168 hours BNP (last 3 results) No results found for this basename: PROBNP,  in the last 8760 hours CBG: No results found for this basename: GLUCAP,  in the last 168 hours  Recent Results (from the past 240 hour(s))  URINE CULTURE     Status: None   Collection Time    11/30/12  8:41 PM      Result Value Range Status   Specimen Description URINE, RANDOM   Final   Special Requests NONE   Final   Culture  Setup Time 12/01/2012 01:24   Final   Colony Count 10,000 COLONIES/ML   Final   Culture GRAM NEGATIVE RODS   Final   Report Status PENDING   Incomplete     Studies: Dg Chest 2 View  11/30/2012  *RADIOLOGY REPORT*  Clinical Data:  62 year old male with cough, shortness of breath, jaundice.  CHEST - 2 VIEW  Comparison: 04/15/2009 and earlier.   Findings:  Lower lung volumes.  Mild streaky opacity at the lung bases most resembles atelectasis.  Cardiac size and mediastinal contours are within normal limits.  Visualized tracheal air column is within normal limits.  No pneumothorax, pulmonary edema or pleural effusion. No acute osseous abnormality identified.  IMPRESSION: Atelectasis, otherwise no acute cardiopulmonary abnormality.     Original Report Authenticated By: Erskine Speed, M.D.    US Abdomen Complete  12/01/2012  *RADIOLOGY REPORT*  Clinical Data:  Hepatic cirrhosis.  COMPLETE ABDOMINAL ULTRASOUND  Comparison:  None.  Findings:  Gallbladder:  Cholelithiasis is present.  Largest stone is 7 mm. There is no sonographic Murphy's sign.  No wall thickening or pericholecystic fluid.  Gallbladder wall measures 3 mm.  Biliary sludge is also present.  Common bile duct:  5 mm, normal.  Liver:  Findings compatible with hepatic cirrhosis.  No mass lesion.  The liver is hyperechoic when compared to the adjacent right kidney.  Capsular nodularity is present.  IVC:  Appears normal.  Pancreas:  No mass lesion.  Pancreatic duct not visualized  Spleen:  7.4 cm.Normal echotexture.  Right Kidney:  10.9 cm. Normal echotexture.  Normal central sinus echo complex.  No calculi or hydronephrosis.  Left Kidney:  11.9 cm. Normal echotexture.  Normal central sinus echo complex.  No calculi or hydronephrosis.  Abdominal aorta:  Proximal abdominal aorta measures 2.7 cm.  No aneurysm.  IMPRESSION:  1.  Hepatic cirrhosis.  No mass lesion. 2. Cholelithiasis and biliary sludge without findings of acute cholecystitis.   Original Report Authenticated By: Andreas Newport, M.D.     Scheduled Meds: . clotrimazole  1 application Topical BID  . Fluocinolone Acetonide  5 drop Left Ear BID  . furosemide  40 mg Intravenous Daily  . lactulose  30 g Oral TID  . levofloxacin (LEVAQUIN) IV  750 mg Intravenous Q24H  . magnesium oxide  400 mg Oral Daily  . pantoprazole  40 mg Oral BID  .  phytonadione  10 mg Oral Once  . potassium chloride  40 mEq Oral Daily  . rifaximin  550 mg Oral BID  . sodium chloride  3 mL Intravenous Q12H  . spironolactone  100 mg Oral Daily  . thiamine  100 mg Oral Daily   Continuous Infusions:     Time spent:25 minutes    Jonique Kulig  [facility] Pager [phone]. If 7PM-7AM, please contact night-coverage at [email or web address], password [facility] 12/02/2012, 9:18 AM  LOS: 2 days

## 2012-12-02 NOTE — Progress Notes (Signed)
Physical Therapy Treatment Patient Details Name: Logan Patrick MRN: [id number] DOB: 1950/10/02 Today's Date: 12/02/2012 Time: 4098-1191 PT Time Calculation (min): 15 min  PT Assessment / Plan / Recommendation Comments on Treatment Session  Improvements with gait distance/endurance.  Continues to require cues for safety with gait/RW use.    Follow Up Recommendations  SNF     Does the patient have the potential to tolerate intense rehabilitation     Barriers to Discharge        Equipment Recommendations  None recommended by PT    Recommendations for Other Services    Frequency Min 3X/week   Plan Discharge plan remains appropriate;Frequency remains appropriate    Precautions / Restrictions Precautions Precautions: Fall Restrictions Weight Bearing Restrictions: No   Pertinent Vitals/Pain     Mobility  Bed Mobility Bed Mobility: Supine to Sit;Sit to Supine Supine to Sit: 4: Min guard;With rails;HOB elevated Sit to Supine: 4: Min guard;With rail;HOB elevated Details for Bed Mobility Assistance: Verbal cues for technique. Transfers Transfers: Sit to Stand;Stand to Sit Sit to Stand: 4: Min assist;With upper extremity assist;From bed Stand to Sit: 4: Min guard;With upper extremity assist;To bed Details for Transfer Assistance: Verbal cues for safety.  Cues to stand for several seconds before beginning gait.  Cues for placement when sitting on bed. Ambulation/Gait Ambulation/Gait Assistance: 4: Min guard Ambulation Distance (Feet): 350 Feet Assistive device: Rolling walker Ambulation/Gait Assistance Details: Verbal cues for safe use of RW.  Continues to move RW too far ahead of himself.  Also cues during turns to keep feet inside RW. Gait Pattern: Step-through pattern;Trunk flexed (Hips flexed and knees extended - stiff gait) Gait velocity: Fast paced - Verbal cues to slow to safe speed    Exercises General Exercises - Lower Extremity Ankle Circles/Pumps: AROM;Both;10  reps;Supine   PT Diagnosis:    PT Problem List:   PT Treatment Interventions:     PT Goals Acute Rehab PT Goals Pt will go Supine/Side to Sit: with supervision;with HOB 0 degrees PT Goal: Supine/Side to Sit - Progress: Progressing toward goal Pt will go Sit to Stand: with supervision;with upper extremity assist PT Goal: Sit to Stand - Progress: Progressing toward goal Pt will Ambulate: >150 feet;with supervision;with rolling walker PT Goal: Ambulate - Progress: Progressing toward goal  Visit Information  Last PT Received On: 12/02/12 Assistance Needed: +1    Subjective Data  Subjective: "We can go more today"   Cognition  Cognition Arousal/Alertness: Awake/alert Behavior During Therapy: Restless Overall Cognitive Status: History of cognitive impairments - at baseline (Decreased safety awareness)    Balance     End of Session PT - End of Session Equipment Utilized During Treatment: Gait belt Activity Tolerance: Patient tolerated treatment well Patient left: in bed;with call bell/phone within reach;with bed alarm set Nurse Communication: Mobility status   GP     Vena Austria 12/02/2012, 10:24 AM Durenda Hurt. Renaldo Fiddler, [facility] Acute Rehab Services Pager [phone]

## 2012-12-03 LAB — URINE CULTURE: Colony Count: 10000

## 2012-12-03 LAB — COMPREHENSIVE METABOLIC PANEL
BUN: 16 mg/dL (ref 6–23)
CO2: 24 mEq/L (ref 19–32)
Calcium: 9.1 mg/dL (ref 8.4–10.5)
Chloride: 101 mEq/L (ref 96–112)
Creatinine, Ser: 1.24 mg/dL (ref 0.50–1.35)
GFR calc Af Amer: 71 mL/min — ABNORMAL LOW (ref >90)
GFR calc non Af Amer: 61 mL/min — ABNORMAL LOW (ref >90)
Total Bilirubin: 4.8 mg/dL — ABNORMAL HIGH (ref 0.3–1.2)

## 2012-12-03 LAB — PROTIME-INR
INR: 1.52 — ABNORMAL HIGH (ref 0.00–1.49)
Prothrombin Time: 17.9 seconds — ABNORMAL HIGH (ref 11.6–15.2)

## 2012-12-03 MED ORDER — LEVOFLOXACIN 750 MG PO TABS: 750.0000 mg | ORAL_TABLET | Freq: Every day | ORAL | Status: DC

## 2012-12-03 MED ORDER — LEVOFLOXACIN 750 MG PO TABS
750.0000 mg | ORAL_TABLET | ORAL | Status: DC
  Administered 2012-12-04 – 2012-12-06 (×3): 750 mg via ORAL
  Filled 2012-12-03 (×5): qty 1

## 2012-12-03 MED ORDER — LEVOFLOXACIN 750 MG PO TABS
750.0000 mg | ORAL_TABLET | ORAL | Status: DC
  Filled 2012-12-03: qty 1

## 2012-12-03 NOTE — Progress Notes (Addendum)
ANTIBIOTIC CONSULT NOTE - FOLLOW UP  Pharmacy Consult for Levaquin Indication: bronchitis  No Known Allergies  Patient Measurements: Height: 5\' 11"  (180.3 cm) Weight: 212 lb 1.5 oz (96.205 kg) IBW/kg (Calculated) : 75.3  Vital Signs: Temp: 98.1 F (36.7 C) (05/11 0435) Temp src: Oral (05/11 0435) BP: 107/65 mmHg (05/11 0435) Pulse Rate: 81 (05/11 0435) Intake/Output from previous day: 05/10 0701 - 05/11 0700 In: 840 [P.O.:840] Out: 1200 [Urine:1200] Intake/Output from this shift:    Labs:  Recent Labs  11/30/12 1951 12/01/12 0640 12/02/12 0500  WBC 8.1 6.8 7.1  HGB 9.4* 9.5* 9.0*  PLT 124* 120* 104*  CREATININE 0.93 0.89 1.05   Estimated Creatinine Clearance: 87.5 ml/min (by C-G formula based on Cr of 1.05). No results found for this basename: VANCOTROUGH, Leodis Binet, VANCORANDOM, GENTTROUGH, GENTPEAK, GENTRANDOM, TOBRATROUGH, TOBRAPEAK, TOBRARND, AMIKACINPEAK, AMIKACINTROU, AMIKACIN,  in the last 72 hours   Microbiology: Recent Results (from the past 720 hour(s))  URINE CULTURE     Status: None   Collection Time    11/30/12  8:41 PM      Result Value Range Status   Specimen Description URINE, RANDOM   Final   Special Requests NONE   Final   Culture  Setup Time 12/01/2012 01:24   Final   Colony Count 10,000 COLONIES/ML   Final   Culture GRAM NEGATIVE RODS   Final   Report Status PENDING   Incomplete    Anti-infectives   Start     Dose/Rate Route Frequency Ordered Stop   12/01/12 0101  rifaximin (XIFAXAN) tablet 550 mg     550 mg Oral 2 times daily 12/01/12 0101     11/30/12 2359  levofloxacin (LEVAQUIN) IVPB 750 mg     750 mg 100 mL/hr over 90 Minutes Intravenous Every 24 hours 11/30/12 2340        Assessment: 62 yo male with etoh cirrhosis presenting with a flare of his bronchitis. Renal function has remained good thus far with CrCl ~85 and without signs of overt infection (afeb normal WBC).  Plan:  - Continue Levaquin 750mg  once daily - Change  dosage route to PO due to good absorption and to help with fluid restriction  - Would consider a max of 7 days of treatment unless patient decompensates   Vania Rea. Darin Engels.D. Clinical Pharmacist Pager [phone] Phone [phone] 12/03/2012 7:55 AM

## 2012-12-03 NOTE — Progress Notes (Signed)
[facility] PROGRESS NOTE  MARCIAL PLESS ZOX:[id number] DOB: September 18, 1950 DOA: 11/30/2012 PCP: Default, Provider, MD  Brief narrative:  62 y/o male with etoh cirrhosis brought in by daughter after she felt she was unable to take care of him at home. Patient noted to have mild decompensated cirrhosis and acute bronchitis.   Assessment/Plan:  Decompensated cirrhosis of liver  Associated ascites, jaundice, mild thrombocytopenia and coagulopathy.  Secondary to etoh use  salt restricted diet  -US liver shows cirrhosis  -Acute hepatitis panel pending  Started on lasix and aldactone. Vitamin k for coagulopathy.  Continue rifaximin. D/c lactulose started on admission as patient developing diarrhea and no signs of  encephalopathy Patient abstinent from etoh only for past 2 wks only. No signs fo withdrawal or encephaloapthy  GI Dr Elnoria Howard consulted. Appreciate recommendations.   Bronchitis  On levaquin since admission   Hypokalemia  replenish   Social issue  daughter unable to take care of him at home. Seen by PT. Recommend SNF   Code Status: full  Family Communication:daughter at bedside  Disposition Plan SNF   Consultants:  Dr Elnoria Howard  Procedures:  none Antibiotics:  levaqin ( day 2) HPI/Subjective:  Feels better today. Cough improved   HPI/Subjective: Feels better today  Objective: Filed Vitals:   12/02/12 2016 12/03/12 0435 12/03/12 0800 12/03/12 1420  BP: 123/71 107/65 116/68 122/70  Pulse: 88 81 81 84  Temp: 99 F (37.2 C) 98.1 F (36.7 C) 98.5 F (36.9 C) 97.8 F (36.6 C)  TempSrc: Oral Oral Oral Oral  Resp: 18 18 18 18   Height:      Weight: 96.205 kg (212 lb 1.5 oz)     SpO2: 98% 98% 98% 98%    Intake/Output Summary (Last 24 hours) at 12/03/12 1635 Last data filed at 12/03/12 1528  Gross per 24 hour  Intake    600 ml  Output   1825 ml  Net  -1225 ml   Filed Weights   12/01/12 1500 12/01/12 2100 12/02/12 2016  Weight: 95.709 kg (211 lb) 96.208 kg (212  lb 1.6 oz) 96.205 kg (212 lb 1.5 oz)    Exam:  General: Middle aged male in NAD HEENT: no pallor, moist mucosa, icteric  Chest: clear b/l, no added sounds, areas of ecchymosis  Cardiovascular: NS1&S2, no murmurs  Abdomen: Non distended, non tender, no hepatomegaly, areas of ecchymoses  Musculoskeletal: warm ,trace pitting edema b/l ,improved CNS: AAOX3, no tremors   Data Reviewed: Basic Metabolic Panel:  Recent Labs Lab 11/30/12 1951 12/01/12 0640 12/02/12 0500 12/03/12 0620  NA 140 141 139 137  K 3.3* 3.2* 3.0* 4.1  CL 107 106 102 101  CO2 25 26 26 24   GLUCOSE 114* 84 78 81  BUN 16 14 12 16   CREATININE 0.93 0.89 1.05 1.24  CALCIUM 9.6 9.5 8.9 9.1   Liver Function Tests:  Recent Labs Lab 11/30/12 1951 12/01/12 0640 12/02/12 0500 12/03/12 0620  AST 101* 83* 65* 59*  ALT 30 30 27 23   ALKPHOS 95 72 59 57  BILITOT 5.0* 5.4* 5.3* 4.8*  PROT 6.7 6.7 6.1 6.0  ALBUMIN 2.1* 2.1* 2.1* 2.0*   No results found for this basename: LIPASE, AMYLASE,  in the last 168 hours  Recent Labs Lab 11/30/12 1952  AMMONIA 40   CBC:  Recent Labs Lab 11/30/12 1951 12/01/12 0640 12/02/12 0500  WBC 8.1 6.8 7.1  NEUTROABS 5.6  --   --   HGB 9.4* 9.5* 9.0*  HCT 27.6*  28.3* 26.4*  MCV 101.5* 102.5* 101.5*  PLT 124* 120* 104*   Cardiac Enzymes: No results found for this basename: CKTOTAL, CKMB, CKMBINDEX, TROPONINI,  in the last 168 hours BNP (last 3 results) No results found for this basename: PROBNP,  in the last 8760 hours CBG: No results found for this basename: GLUCAP,  in the last 168 hours  Recent Results (from the past 240 hour(s))  URINE CULTURE     Status: None   Collection Time    11/30/12  8:41 PM      Result Value Range Status   Specimen Description URINE, RANDOM   Final   Special Requests NONE   Final   Culture  Setup Time 12/01/2012 01:24   Final   Colony Count 10,000 COLONIES/ML   Final   Culture ESCHERICHIA COLI   Final   Report Status 12/03/2012  FINAL   Final   Organism ID, Bacteria ESCHERICHIA COLI   Final     Studies: No results found.  Scheduled Meds: . clotrimazole  1 application Topical BID  . Fluocinolone Acetonide  5 drop Left Ear BID  . furosemide  40 mg Intravenous Daily  . [START ON 12/04/2012] levofloxacin  750 mg Oral Q24H  . magnesium oxide  400 mg Oral Daily  . pantoprazole  40 mg Oral BID  . potassium chloride  40 mEq Oral Daily  . rifaximin  550 mg Oral BID  . sodium chloride  3 mL Intravenous Q12H  . spironolactone  100 mg Oral Daily  . thiamine  100 mg Oral Daily   Continuous Infusions:     Time spent: 25 minutes    Chaniece Barbato  [facility] Pager [phone] If 7PM-7AM, please contact night-coverage at [email or web address], password [facility] 12/03/2012, 4:35 PM  LOS: 3 days

## 2012-12-04 LAB — COMPREHENSIVE METABOLIC PANEL
AST: 56 U/L — ABNORMAL HIGH (ref 0–37)
Albumin: 2 g/dL — ABNORMAL LOW (ref 3.5–5.2)
Alkaline Phosphatase: 60 U/L (ref 39–117)
BUN: 18 mg/dL (ref 6–23)
Chloride: 101 mEq/L (ref 96–112)
Potassium: 3.5 mEq/L (ref 3.5–5.1)
Sodium: 136 mEq/L (ref 135–145)
Total Protein: 6.3 g/dL (ref 6.0–8.3)

## 2012-12-04 LAB — HEPATITIS PANEL, ACUTE
HCV Ab: NEGATIVE
Hep A IgM: NEGATIVE
Hep B C IgM: NEGATIVE

## 2012-12-04 LAB — CBC
MCHC: 34.3 g/dL (ref 30.0–36.0)
Platelets: 88 10*3/uL — ABNORMAL LOW (ref 150–400)
RDW: 19.7 % — ABNORMAL HIGH (ref 11.5–15.5)
WBC: 7.6 10*3/uL (ref 4.0–10.5)

## 2012-12-04 LAB — PROTIME-INR: Prothrombin Time: 17.9 seconds — ABNORMAL HIGH (ref 11.6–15.2)

## 2012-12-04 MED ORDER — PHYTONADIONE 5 MG PO TABS
5.0000 mg | ORAL_TABLET | Freq: Once | ORAL | Status: AC
  Administered 2012-12-04: 5 mg via ORAL
  Filled 2012-12-04: qty 1

## 2012-12-04 NOTE — Progress Notes (Signed)
[facility] PROGRESS NOTE  Logan Patrick MWU:[id number] DOB: 07-Dec-1950 DOA: 11/30/2012 PCP: Default, Provider, MD  Brief narrative:  62 y/o male with etoh cirrhosis brought in by daughter after she felt she was unable to take care of him at home. Patient noted to have mild decompensated cirrhosis and acute bronchitis.   Assessment/Plan:  Decompensated cirrhosis of liver  Associated ascites, jaundice, mild thrombocytopenia and coagulopathy.  Secondary to etoh use  salt restricted diet  -US liver shows cirrhosis  -Acute hepatitis panel negative Started on lasix and aldactone. Vitamin k for coagulopathy.  Continue rifaximin. D/c lactulose started on admission as patient developing diarrhea and no signs of encephalopathy  Patient abstinent from etoh only for past 2 wks only. No signs fo withdrawal or encephaloapthy  GI Dr Elnoria Howard consulted. Appreciate recommendations.   Bronchitis  On levaquin since admission , completes tomorrow  Hypokalemia  replenish   Social issue  daughter unable to take care of him at home. Seen by PT. Recommend SNF . SW looking for SNF  Code Status: full  Family Communication:daughter at bedside   Disposition Plan SNF when available  Consultants:  Dr Elnoria Howard  Procedures:  none Antibiotics:  levaqin ( day4)     HPI/Subjective: No overnight issues  Objective: Filed Vitals:   12/03/12 2048 12/04/12 0444 12/04/12 0930 12/04/12 1400  BP: 111/67 115/75 116/66 102/61  Pulse: 86 82 97 111  Temp: 98.9 F (37.2 C) 98.3 F (36.8 C) 97.7 F (36.5 C) 98 F (36.7 C)  TempSrc: Oral Oral Oral Oral  Resp: 18 17 18 18   Height:      Weight: 96.205 kg (212 lb 1.5 oz)     SpO2: 98% 94% 97% 95%    Intake/Output Summary (Last 24 hours) at 12/04/12 1642 Last data filed at 12/04/12 1500  Gross per 24 hour  Intake   1200 ml  Output   2027 ml  Net   -827 ml   Filed Weights   12/01/12 2100 12/02/12 2016 12/03/12 2048  Weight: 96.208 kg (212 lb 1.6 oz)  96.205 kg (212 lb 1.5 oz) 96.205 kg (212 lb 1.5 oz)    Exam:  General: Middle aged male in NAD  HEENT: no pallor, moist mucosa, icteric  Chest: clear b/l, no added sounds, areas of ecchymosis  Cardiovascular: NS1&S2, no murmurs  Abdomen: Non distended, non tender, no hepatomegaly, areas of ecchymoses  Musculoskeletal: warm ,trace pitting edema b/l ,improved  CNS: AAOX3, no tremors   Data Reviewed: Basic Metabolic Panel:  Recent Labs Lab 11/30/12 1951 12/01/12 0640 12/02/12 0500 12/03/12 0620 12/04/12 0610  NA 140 141 139 137 136  K 3.3* 3.2* 3.0* 4.1 3.5  CL 107 106 102 101 101  CO2 25 26 26 24 30   GLUCOSE 114* 84 78 81 87  BUN 16 14 12 16 18   CREATININE 0.93 0.89 1.05 1.24 1.29  CALCIUM 9.6 9.5 8.9 9.1 9.1   Liver Function Tests:  Recent Labs Lab 11/30/12 1951 12/01/12 0640 12/02/12 0500 12/03/12 0620 12/04/12 0610  AST 101* 83* 65* 59* 56*  ALT 30 30 27 23 20   ALKPHOS 95 72 59 57 60  BILITOT 5.0* 5.4* 5.3* 4.8* 4.8*  PROT 6.7 6.7 6.1 6.0 6.3  ALBUMIN 2.1* 2.1* 2.1* 2.0* 2.0*   No results found for this basename: LIPASE, AMYLASE,  in the last 168 hours  Recent Labs Lab 11/30/12 1952  AMMONIA 40   CBC:  Recent Labs Lab 11/30/12 1951 12/01/12 0640 12/02/12  0500 12/04/12 0610  WBC 8.1 6.8 7.1 7.6  NEUTROABS 5.6  --   --   --   HGB 9.4* 9.5* 9.0* 9.3*  HCT 27.6* 28.3* 26.4* 27.1*  MCV 101.5* 102.5* 101.5* 103.0*  PLT 124* 120* 104* 88*   Cardiac Enzymes: No results found for this basename: CKTOTAL, CKMB, CKMBINDEX, TROPONINI,  in the last 168 hours BNP (last 3 results) No results found for this basename: PROBNP,  in the last 8760 hours CBG: No results found for this basename: GLUCAP,  in the last 168 hours  Recent Results (from the past 240 hour(s))  URINE CULTURE     Status: None   Collection Time    11/30/12  8:41 PM      Result Value Range Status   Specimen Description URINE, RANDOM   Final   Special Requests NONE   Final   Culture   Setup Time 12/01/2012 01:24   Final   Colony Count 10,000 COLONIES/ML   Final   Culture ESCHERICHIA COLI   Final   Report Status 12/03/2012 FINAL   Final   Organism ID, Bacteria ESCHERICHIA COLI   Final     Studies: No results found.  Scheduled Meds: . clotrimazole  1 application Topical BID  . Fluocinolone Acetonide  5 drop Left Ear BID  . furosemide  40 mg Intravenous Daily  . levofloxacin  750 mg Oral Q24H  . magnesium oxide  400 mg Oral Daily  . pantoprazole  40 mg Oral BID  . potassium chloride  40 mEq Oral Daily  . rifaximin  550 mg Oral BID  . sodium chloride  3 mL Intravenous Q12H  . spironolactone  100 mg Oral Daily  . thiamine  100 mg Oral Daily   Continuous Infusions:     Time spent: 25 minutes    Logan Patrick  [facility] Pager [phone] If 7PM-7AM, please contact night-coverage at [email or web address], password [facility] 12/04/2012, 4:42 PM  LOS: 4 days

## 2012-12-04 NOTE — Clinical Social Work Note (Signed)
CSW rec'd consult regarding placement. Patient assessed and information sent to SNF's in [facility] (full assessment to follow). Daughter chose [facility] and they were contacted. Patient has a Oceanographer and SNF admissions director has made contact with them and submitted clinicals. CSW will be advised when decision on authorization rec'd from insurance company.  Genelle Bal, MSW, LCSW [phone]

## 2012-12-04 NOTE — Progress Notes (Signed)
Physical Therapy Treatment Patient Details Name: Logan Patrick MRN: [id number] DOB: [birth year] Today's Date: 12/04/2012 Time: 4098-1191 PT Time Calculation (min): 32 min  PT Assessment / Plan / Recommendation Comments on Treatment Session  Improvements with gait distance/endurance.  Continues to require cues for safety with gait/RW use.    Follow Up Recommendations  SNF     Does the patient have the potential to tolerate intense rehabilitation     Barriers to Discharge        Equipment Recommendations  None recommended by PT    Recommendations for Other Services    Frequency Min 3X/week   Plan Discharge plan remains appropriate;Frequency remains appropriate    Precautions / Restrictions Precautions Precautions: Fall   Pertinent Vitals/Pain no apparent distress     Mobility  Bed Mobility Bed Mobility: Supine to Sit Supine to Sit: 5: Supervision;With rails Sitting - Scoot to Edge of Bed: 5: Supervision;With rail Details for Bed Mobility Assistance: Verbal cues for technique. Transfers Transfers: Sit to Stand;Stand to Sit Sit to Stand: 4: Min guard;With upper extremity assist;From bed Stand to Sit: 4: Min guard;With upper extremity assist;To bed Details for Transfer Assistance: Verbal cues for safety.  Cues to stand for several seconds before beginning gait.  Cues for placement when sitting on bed.; Noted tends to brace backs of LEs against bed Ambulation/Gait Ambulation/Gait Assistance: 4: Min guard (with and without physical contact) Ambulation Distance (Feet): 300 Feet (greater than) Assistive device: Rolling walker Ambulation/Gait Assistance Details: Sized RW for optimal fit; mod cues for posture and RW proximity; Improving performance on level surface; More difficulty with incline Gait Pattern: Step-through pattern;Trunk flexed (hips flexed and knees extended -- stiff gait) Gait velocity: Fast paced - Verbal cues to slow to safe speed    Exercises     PT  Diagnosis:    PT Problem List:   PT Treatment Interventions:     PT Goals Acute Rehab PT Goals Time For Goal Achievement: 12/08/12 Potential to Achieve Goals: Good Pt will go Supine/Side to Sit: with supervision;with HOB 0 degrees PT Goal: Supine/Side to Sit - Progress: Partly met Pt will go Sit to Stand: with supervision;with upper extremity assist PT Goal: Sit to Stand - Progress: Partly met Pt will Ambulate: >150 feet;with supervision;with rolling walker PT Goal: Ambulate - Progress: Progressing toward goal  Visit Information  Last PT Received On: 12/04/12 Assistance Needed: +1    Subjective Data  Subjective: Eager to get walking   Cognition  Cognition Arousal/Alertness: Awake/alert Behavior During Therapy: WFL for tasks assessed/performed Overall Cognitive Status: History of cognitive impairments - at baseline (decr safety awareness)    Balance     End of Session PT - End of Session Equipment Utilized During Treatment: Gait belt Activity Tolerance: Patient tolerated treatment well Patient left: in bed;with nursing in room (Nurse tech taking vitals) Nurse Communication: Mobility status   GP     Olen Pel Dunbar, [place] [phone]  12/04/2012, 4:23 PM

## 2012-12-05 DIAGNOSIS — IMO0002 Reserved for concepts with insufficient information to code with codable children: Secondary | ICD-10-CM | POA: Diagnosis present

## 2012-12-05 MED ORDER — SPIRONOLACTONE 100 MG PO TABS: 100.0000 mg | ORAL_TABLET | Freq: Every day | ORAL | Status: DC

## 2012-12-05 MED ORDER — FUROSEMIDE 40 MG PO TABS: 40.0000 mg | ORAL_TABLET | Freq: Every day | ORAL | Status: DC

## 2012-12-05 NOTE — Discharge Summary (Signed)
Physician Discharge Summary  AURTHER HARLIN AVW:[id number] DOB: 11-13-1950 DOA: 11/30/2012  PCP: Default, Provider, MD  Admit date: 11/30/2012 Discharge date: 12/05/2012  Time spent: 40  minutes  Recommendations for Outpatient Follow-up:  1. SNF  Discharge Diagnoses:   Principal Problem:   Acute Bronchitis  Active Problems:   Failure to thrive   Cirrhosis of liver, mildly decompensated   Hematuria   Anemia   Hypokalemia   Discharge Condition: fair  Diet recommendation: regular  Filed Weights   12/02/12 2016 12/03/12 2048 12/04/12 2118  Weight: 96.205 kg (212 lb 1.5 oz) 96.205 kg (212 lb 1.5 oz) 96.205 kg (212 lb 1.5 oz)    History of present illness:  please refer to admission H&P for details, but in brief, 62 y/o male with etoh cirrhosis brought in by daughter after she felt she was unable to take care of him at home. Patient noted to have mild decompensated cirrhosis and acute bronchitis.    Hospital Course:   Decompensated cirrhosis of liver  Associated  jaundice, thrombocytopenia and coagulopathy.  Secondary to etoh use  salt restricted diet  -US liver shows cirrhosis  -Acute hepatitis panel negative  Started on lasix and aldactone. Vitamin k given  X 2 for coagulopathy.  Continue rifaximin. Discontinued lactulose  as patient developing diarrhea and no signs of encephalopathy  Patient abstinent from etoh only for past 2 wks only. No signs fo withdrawal or encephaloapthy  GI Dr Elnoria Howard consulted. Appreciate recommendations. Patient can follow up with hi in 4 weeks Encouraged to quit alcohol  Bronchitis  Completed 5 day course of Levaquin. Symptoms resolved.  Hypokalemia  replenished   Social issue  daughter unable to take care of him at home. Seen by PT. Recommend SNF   Code Status: full  Family Communication:none at bedside . Daughter involved in care Disposition Plan SNF    Consultants:  Dr Elnoria Howard    Procedures:  none Antibiotics:   levaquin   Discharge Exam: Filed Vitals:   12/05/12 0618 12/05/12 0919 12/05/12 1328 12/05/12 1628  BP: 108/64 123/72 113/69 111/73  Pulse: 99 99 105 83  Temp: 98.5 F (36.9 C) 98.6 F (37 C) 98.6 F (37 C) 97.8 F (36.6 C)  TempSrc: Oral     Resp: 18 17 20 18   Height:      Weight:      SpO2: 100% 100% 96% 96%    General: Middle aged male in NAD  HEENT: no pallor, moist mucosa, icteric  Chest: clear b/l, no added sounds, areas of ecchymosis  Cardiovascular: NS1&S2, no murmurs  Abdomen: Non distended, non tender, no hepatomegaly, areas of ecchymoses  Musculoskeletal: warm ,trace pitting edema b/l ,improved  CNS: AAOX3, no tremors   Discharge Instructions     Medication List    STOP taking these medications       lactulose 10 GM/15ML solution  Commonly known as:  CHRONULAC      TAKE these medications       albuterol-ipratropium 18-103 MCG/ACT inhaler  Commonly known as:  COMBIVENT  Inhale 2 puffs into the lungs every 6 (six) hours as needed for wheezing. For wheezing     B COMPLEX-B12 PO  Take 1 tablet by mouth daily.     clotrimazole 1 % cream  Commonly known as:  LOTRIMIN  Apply 1 application topically 2 (two) times daily. Apply to facial rash-avoid eyes     Fluocinolone Acetonide 0.01 % Oil  Place 5 drops into the left ear  2 (two) times daily.     furosemide 40 MG tablet  Commonly known as:  LASIX  Take 1 tablet (40 mg total) by mouth daily.     magnesium oxide 400 MG tablet  Commonly known as:  MAG-OX  Take 400 mg by mouth See admin instructions. Take 1 tab 2 times daily x5 days then 1 tab daily thereafter     pantoprazole 40 MG tablet  Commonly known as:  PROTONIX  Take 40 mg by mouth 2 (two) times daily.     potassium chloride 10 MEQ tablet  Commonly known as:  K-DUR  Take 10 mEq by mouth daily.     rifaximin 550 MG Tabs  Commonly known as:  XIFAXAN  Take 550 mg by mouth 2 (two) times daily.     spironolactone 100 MG tablet  Commonly  known as:  ALDACTONE  Take 1 tablet (100 mg total) by mouth daily.       No Known Allergies     Follow-up Information   Follow up with HUNG,PATRICK D, MD In 4 weeks.   Contact information:   [address] [place] [address] [phone]        The results of significant diagnostics from this hospitalization (including imaging, microbiology, ancillary and laboratory) are listed below for reference.    Significant Diagnostic Studies: Dg Chest 2 View  11/30/2012  *RADIOLOGY REPORT*  Clinical Data:  62 year old male with cough, shortness of breath, jaundice.  CHEST - 2 VIEW  Comparison: 04/15/2009 and earlier.  Findings:  Lower lung volumes.  Mild streaky opacity at the lung bases most resembles atelectasis.  Cardiac size and mediastinal contours are within normal limits.  Visualized tracheal air column is within normal limits.  No pneumothorax, pulmonary edema or pleural effusion. No acute osseous abnormality identified.  IMPRESSION: Atelectasis, otherwise no acute cardiopulmonary abnormality.     Original Report Authenticated By: Erskine Speed, M.D.    US Abdomen Complete  12/01/2012  *RADIOLOGY REPORT*  Clinical Data:  Hepatic cirrhosis.  COMPLETE ABDOMINAL ULTRASOUND  Comparison:  None.  Findings:  Gallbladder:  Cholelithiasis is present.  Largest stone is 7 mm. There is no sonographic Murphy's sign.  No wall thickening or pericholecystic fluid.  Gallbladder wall measures 3 mm.  Biliary sludge is also present.  Common bile duct:  5 mm, normal.  Liver:  Findings compatible with hepatic cirrhosis.  No mass lesion.  The liver is hyperechoic when compared to the adjacent right kidney.  Capsular nodularity is present.  IVC:  Appears normal.  Pancreas:  No mass lesion.  Pancreatic duct not visualized  Spleen:  7.4 cm.Normal echotexture.  Right Kidney:  10.9 cm. Normal echotexture.  Normal central sinus echo complex.  No calculi or hydronephrosis.  Left Kidney:  11.9 cm. Normal  echotexture.  Normal central sinus echo complex.  No calculi or hydronephrosis.  Abdominal aorta:  Proximal abdominal aorta measures 2.7 cm.  No aneurysm.  IMPRESSION:  1.  Hepatic cirrhosis.  No mass lesion. 2. Cholelithiasis and biliary sludge without findings of acute cholecystitis.   Original Report Authenticated By: Andreas Newport, M.D.     Microbiology: Recent Results (from the past 240 hour(s))  URINE CULTURE     Status: None   Collection Time    11/30/12  8:41 PM      Result Value Range Status   Specimen Description URINE, RANDOM   Final   Special Requests NONE   Final   Culture  Setup  Time 12/01/2012 01:24   Final   Colony Count 10,000 COLONIES/ML   Final   Culture ESCHERICHIA COLI   Final   Report Status 12/03/2012 FINAL   Final   Organism ID, Bacteria ESCHERICHIA COLI   Final     Labs: Basic Metabolic Panel:  Recent Labs Lab 11/30/12 1951 12/01/12 0640 12/02/12 0500 12/03/12 0620 12/04/12 0610  NA 140 141 139 137 136  K 3.3* 3.2* 3.0* 4.1 3.5  CL 107 106 102 101 101  CO2 25 26 26 24 30   GLUCOSE 114* 84 78 81 87  BUN 16 14 12 16 18   CREATININE 0.93 0.89 1.05 1.24 1.29  CALCIUM 9.6 9.5 8.9 9.1 9.1   Liver Function Tests:  Recent Labs Lab 11/30/12 1951 12/01/12 0640 12/02/12 0500 12/03/12 0620 12/04/12 0610  AST 101* 83* 65* 59* 56*  ALT 30 30 27 23 20   ALKPHOS 95 72 59 57 60  BILITOT 5.0* 5.4* 5.3* 4.8* 4.8*  PROT 6.7 6.7 6.1 6.0 6.3  ALBUMIN 2.1* 2.1* 2.1* 2.0* 2.0*   No results found for this basename: LIPASE, AMYLASE,  in the last 168 hours  Recent Labs Lab 11/30/12 1952  AMMONIA 40   CBC:  Recent Labs Lab 11/30/12 1951 12/01/12 0640 12/02/12 0500 12/04/12 0610  WBC 8.1 6.8 7.1 7.6  NEUTROABS 5.6  --   --   --   HGB 9.4* 9.5* 9.0* 9.3*  HCT 27.6* 28.3* 26.4* 27.1*  MCV 101.5* 102.5* 101.5* 103.0*  PLT 124* 120* 104* 88*   Cardiac Enzymes: No results found for this basename: CKTOTAL, CKMB, CKMBINDEX, TROPONINI,  in the last 168  hours BNP: BNP (last 3 results) No results found for this basename: PROBNP,  in the last 8760 hours CBG: No results found for this basename: GLUCAP,  in the last 168 hours     Signed:  Eddie North  [facility] 12/05/2012, 4:32 PM

## 2012-12-05 NOTE — Progress Notes (Signed)
Utilization review completed.  

## 2012-12-05 NOTE — Progress Notes (Signed)
Physical Therapy Treatment Patient Details Name: Logan Patrick MRN: [id number] DOB: [birth year] Today's Date: 12/05/2012 Time: 5284-1324 PT Time Calculation (min): 18 min  PT Assessment / Plan / Recommendation Comments on Treatment Session  Pt continiues to make improvements in functional mobility.      Follow Up Recommendations  SNF     Does the patient have the potential to tolerate intense rehabilitation     Barriers to Discharge        Equipment Recommendations  None recommended by PT    Recommendations for Other Services    Frequency Min 3X/week   Plan Discharge plan remains appropriate;Frequency remains appropriate    Precautions / Restrictions Precautions Precautions: Fall Restrictions Weight Bearing Restrictions: No   Pertinent Vitals/Pain No c/o pain.      Mobility  Bed Mobility Bed Mobility: Supine to Sit;Sit to Supine Supine to Sit: 6: Modified independent (Device/Increase time) Sitting - Scoot to Edge of Bed: 6: Modified independent (Device/Increase time) Sit to Supine: 6: Modified independent (Device/Increase time) Transfers Transfers: Sit to Stand;Stand to Sit Sit to Stand: 5: Supervision;From bed Stand to Sit: 5: Supervision;To bed Ambulation/Gait Ambulation/Gait Assistance: 4: Min guard Ambulation Distance (Feet): 300 Feet Assistive device: Rolling walker;None Ambulation/Gait Assistance Details: Pt able to ambulate with no AD.  Decreased step length and gait velocity with no AD.    Gait Pattern: Decreased step length - right;Decreased step length - left Wheelchair Mobility Wheelchair Mobility: No    Exercises     PT Diagnosis:    PT Problem List:   PT Treatment Interventions:     PT Goals Acute Rehab PT Goals PT Goal Formulation: With patient Time For Goal Achievement: 12/08/12 Potential to Achieve Goals: Good Pt will go Supine/Side to Sit: with supervision;with HOB 0 degrees PT Goal: Supine/Side to Sit - Progress: Progressing toward  goal Pt will go Sit to Stand: with supervision;with upper extremity assist PT Goal: Sit to Stand - Progress: Progressing toward goal Pt will Ambulate: >150 feet;with supervision;with rolling walker PT Goal: Ambulate - Progress: Progressing toward goal  Visit Information  Last PT Received On: 12/05/12 Assistance Needed: +1    Subjective Data      Cognition  Cognition Arousal/Alertness: Awake/alert Behavior During Therapy: WFL for tasks assessed/performed Overall Cognitive Status: History of cognitive impairments - at baseline    Balance     End of Session PT - End of Session Equipment Utilized During Treatment: Gait belt Activity Tolerance: Patient tolerated treatment well Patient left: in bed;with nursing in room Nurse Communication: Mobility status   GP     Logan Patrick 12/05/2012, 11:26 AM Logan Patrick Si DPT  pager [phone]    Cell [phone]

## 2012-12-06 MED ORDER — FUROSEMIDE 40 MG PO TABS
40.0000 mg | ORAL_TABLET | Freq: Every day | ORAL | Status: DC
  Administered 2012-12-06 – 2012-12-07 (×2): 40 mg via ORAL
  Filled 2012-12-06 (×3): qty 1

## 2012-12-06 NOTE — Progress Notes (Signed)
Patient seen and examined. Remains medically stable for discharge. At this point solely waiting for insurance approval for skilled nursing facility. Discharge diagnoses and medications remain the same as per discharge summary from 12/05/2012.  Peggye Pitt, MD [facility] Pager: [phone]

## 2012-12-07 NOTE — Progress Notes (Addendum)
Chart reviewed. Patient examined. Awaiting placement. Remains stable. D/C abx.  Completed course  Crista Curb, M.D.

## 2012-12-08 DIAGNOSIS — K769 Liver disease, unspecified: Secondary | ICD-10-CM

## 2012-12-08 DIAGNOSIS — N289 Disorder of kidney and ureter, unspecified: Secondary | ICD-10-CM

## 2012-12-08 HISTORY — DX: Disorder of kidney and ureter, unspecified: N28.9

## 2012-12-08 LAB — BASIC METABOLIC PANEL
BUN: 28 mg/dL — ABNORMAL HIGH (ref 6–23)
CO2: 25 mEq/L (ref 19–32)
Chloride: 98 mEq/L (ref 96–112)
GFR calc Af Amer: 47 mL/min — ABNORMAL LOW (ref >90)
Potassium: 3.9 mEq/L (ref 3.5–5.1)

## 2012-12-08 NOTE — Clinical Social Work Note (Signed)
Daughter selected [facility] for patient's short-term rehab and facility advised same date. Clinicals were sent to [facility] by admissions director 5/12 or 5/13. As of this writing a decision on authorization has not been made by insurance representative. Admissions director Tresa Endo Self has made numerous calls and had numerous conversations with insurance rep. and was told on Thursday and today that patient's information was being forwarded to the medical director. Attending MD advised.  Genelle Bal, MSW, LCSW [phone]

## 2012-12-08 NOTE — Progress Notes (Signed)
Physical Therapy Treatment Patient Details Name: Logan Patrick MRN: [id number] DOB: 01-02-1951 Today's Date: 12/08/2012 Time: 4098-1191 PT Time Calculation (min): 11 min  PT Assessment / Plan / Recommendation Comments on Treatment Session  patient continues to make progress with mobility.  No apparent balance deficits noted, however, patient with unusual gait pattern.  Will decrease frequency to 2x/week and encourage patient to continue ambulating with nursing.  PT will continue to focus on high level balance activities and normalizing  gait pattern.    Follow Up Recommendations  SNF (per pt/family request)     Does the patient have the potential to tolerate intense rehabilitation     Barriers to Discharge        Equipment Recommendations  None recommended by PT    Recommendations for Other Services    Frequency Min 2X/week   Plan Discharge plan remains appropriate;Frequency needs to be updated;Other (comment) (several goals met; will update goals.)    Precautions / Restrictions Restrictions Weight Bearing Restrictions: No   Pertinent Vitals/Pain No pain    Mobility  Transfers Transfers: Sit to Stand;Stand to Sit Sit to Stand: 5: Supervision;From bed Stand to Sit: 5: Supervision;To bed Ambulation/Gait Ambulation/Gait Assistance: 5: Supervision Ambulation Distance (Feet): 300 Feet Assistive device: None (RW for first 10') Ambulation/Gait Assistance Details: Tends to lean slightly forward at hip during gait.  No loss of balance noted even with mild perturbations.  Almosts appears to "waddle" when walking. Gait Pattern: Decreased step length - right;Decreased step length - left;Wide base of support Gait velocity: 32 ft/17 sec = 1.88 ft/se which put patient in Neighborhood Ambulator category and thus at low fall risk    Exercises     PT Diagnosis:    PT Problem List:   PT Treatment Interventions:     PT Goals Acute Rehab PT Goals PT Goal Formulation: With patient Time  For Goal Achievement: 12/22/12 Potential to Achieve Goals: Good Pt will go Supine/Side to Sit: with supervision;with HOB 0 degrees PT Goal: Supine/Side to Sit - Progress: Goal set today Pt will go Sit to Stand: with modified independence PT Goal: Sit to Stand - Progress: Goal set today Pt will Ambulate: >150 feet;with modified independence;with gait velocity >(comment) ft/second (>2.62 ft/sec) PT Goal: Ambulate - Progress: Goal set today Additional Goals Additional Goal #1: patient will pick items off ground with no loss of balance PT Goal: Additional Goal #1 - Progress: Goal set today  Visit Information  Last PT Received On: 12/08/12 Assistance Needed: +1    Subjective Data      Cognition  Cognition Arousal/Alertness: Awake/alert Behavior During Therapy: WFL for tasks assessed/performed Overall Cognitive Status: Within Functional Limits for tasks assessed    Balance     End of Session PT - End of Session Activity Tolerance: Patient tolerated treatment well Patient left: in bed;with bed alarm set;with family/visitor present Nurse Communication: Mobility status   GP     Olivia Canter, [place] [phone] 12/08/2012, 12:08 PM

## 2012-12-08 NOTE — Clinical Social Work Psychosocial (Signed)
Clinical Social Work Department BRIEF PSYCHOSOCIAL ASSESSMENT 12/08/2012  Patient:  Logan Patrick, Logan Patrick     Account Number:  [id number]     Admit date:  11/30/2012  Clinical Social Worker:  Delmer Islam  Date/Time:  12/08/2012 06:13 AM  Referred by:  Physician  Date Referred:  12/01/2012 Referred for  SNF Placement   Other Referral:   Interview type:  Other - See comment Other interview type:   CSW talked with patient and daughter Logan Patrick [phone])    PSYCHOSOCIAL DATA Living Status:  FAMILY Admitted from facility:   Level of care:   Primary support name:  Logan Patrick Primary support relationship to patient:  CHILD, ADULT Degree of support available:   Daughter very supportive    CURRENT CONCERNS Current Concerns  Post-Acute Placement   Other Concerns:    SOCIAL WORK ASSESSMENT / PLAN on 12/04/12 CSW talked with daughter and patient regarding discharge plans and MD's recommendatin of ST rehab. Both patient and daughter in agreement. Daughter had advised early in the admission that she could not care for patient.    Daughter given skilled facility list for [facility] and SNF search process explained. Daughter advised that she could be contacted by phone.   Assessment/plan status:  Psychosocial Support/Ongoing Assessment of Needs Other assessment/ plan:   Information/referral to community resources:   Daughter given SNF list for [facility]    PATIENT'S/FAMILY'S RESPONSE TO PLAN OF CARE: Patient listened as CSW and daughter talked about SNF placement. Both in agreement with SNF placement.

## 2012-12-08 NOTE — Clinical Social Work Note (Addendum)
Clinical Social Work Department CLINICAL SOCIAL WORK PLACEMENT NOTE 12/08/2012  Patient:  Logan Patrick, Logan Patrick  Account Number:  [id number] Admit date:  11/30/2012  Clinical Social Worker:  Genelle Bal, LCSW  Date/time:  12/08/2012 06:20 AM  Clinical Social Work is seeking post-discharge placement for this patient at the following level of care:   SKILLED NURSING   (*CSW will update this form in Epic as items are completed)   12/04/2012  Patient/family provided with [facility] Department of Clinical Social Work's list of facilities offering this level of care within the geographic area requested by the patient (or if unable, by the patient's family).  12/04/2012  Patient/family informed of their freedom to choose among providers that offer the needed level of care, that participate in Medicare, Medicaid or managed care program needed by the patient, have an available bed and are willing to accept the patient.    Patient/family informed of [facility]' ownership interest in [facility], as well as of the fact that they are under no obligation to receive care at this facility.  PASARR submitted to EDS on 11/21/12 PASARR number received from EDS on 11/21/12  FL2 transmitted to all facilities in geographic area requested by pt/family on  12/04/2012 FL2 transmitted to all facilities within larger geographic area on   Patient informed that his/her managed care company has contracts with or will negotiate with  certain facilities, including the following:     Patient/family informed of bed offers received:  12/04/2012 Patient chooses bed at [facility] Physician recommends and patient chooses bed at    Patient to be transferred to [facility] on 12/13/12   Patient to be transferred to facility by ambulance  The following physician request were entered in Epic:   Additional Comments:

## 2012-12-08 NOTE — Progress Notes (Signed)
Still not cleared by insurance for skilled nursing facility.  Subjective: Patient has no complaints. Filed Vitals:   12/08/12 0608 12/08/12 0800 12/08/12 1335 12/08/12 1734  BP: 107/82 123/72 124/80 122/71  Pulse: 81 82 91 84  Temp: 98.2 F (36.8 C) 97.9 F (36.6 C) 98.2 F (36.8 C) 97.9 F (36.6 C)  TempSrc: Oral Oral Oral Oral  Resp: 18 18 18 18   Height:      Weight:      SpO2: 99% 99% 98% 99%   Gen: Jaundiced. Comfortable Lungs CTA without WRR CV RRR without MGR Abd: S,NT,ND Ext no c/c/e  BMET    Component Value Date/Time   NA 133* 12/08/2012 0500   K 3.9 12/08/2012 0500   CL 98 12/08/2012 0500   CO2 25 12/08/2012 0500   GLUCOSE 94 12/08/2012 0500   BUN 28* 12/08/2012 0500   CREATININE 1.75* 12/08/2012 0500   CALCIUM 9.6 12/08/2012 0500   GFRNONAA 40* 12/08/2012 0500   GFRAA 47* 12/08/2012 0500   A/p Remains stable for transfer.  Hold lasix and spironolactone due to increased creatinine. Repeat labs in am  Crista Curb, M.D.

## 2012-12-09 LAB — CBC
HCT: 30.7 % — ABNORMAL LOW (ref 39.0–52.0)
Hemoglobin: 10.7 g/dL — ABNORMAL LOW (ref 13.0–17.0)
MCHC: 34.9 g/dL (ref 30.0–36.0)
MCV: 102.3 fL — ABNORMAL HIGH (ref 78.0–100.0)
WBC: 9 10*3/uL (ref 4.0–10.5)

## 2012-12-09 LAB — COMPREHENSIVE METABOLIC PANEL
Alkaline Phosphatase: 73 U/L (ref 39–117)
BUN: 30 mg/dL — ABNORMAL HIGH (ref 6–23)
Chloride: 98 mEq/L (ref 96–112)
GFR calc Af Amer: 53 mL/min — ABNORMAL LOW (ref >90)
Glucose, Bld: 97 mg/dL (ref 70–99)
Potassium: 4 mEq/L (ref 3.5–5.1)
Total Bilirubin: 4 mg/dL — ABNORMAL HIGH (ref 0.3–1.2)

## 2012-12-09 MED ORDER — OXYCODONE HCL 5 MG PO TABS
5.0000 mg | ORAL_TABLET | ORAL | Status: DC | PRN
  Administered 2012-12-09 – 2012-12-10 (×2): 5 mg via ORAL
  Filled 2012-12-09 (×2): qty 1

## 2012-12-09 NOTE — Progress Notes (Signed)
Still not cleared by insurance for skilled nursing facility.  Subjective: Patient has no complaints. Filed Vitals:   12/09/12 0518 12/09/12 0926 12/09/12 1427 12/09/12 1739  BP: 117/71 135/88 120/79 124/79  Pulse: 95 96 86 91  Temp: 98.6 F (37 C) 97.9 F (36.6 C) 97.7 F (36.5 C) 98 F (36.7 C)  TempSrc: Oral Oral Oral Oral  Resp: 18 17 18 17   Height:      Weight:      SpO2: 96% 100% 100% 100%   Gen: Jaundiced. Comfortable Lungs CTA without WRR CV RRR without MGR Abd: S,NT,ND Ext no c/c/e  BMET    Component Value Date/Time   NA 133* 12/09/2012 0600   K 4.0 12/09/2012 0600   CL 98 12/09/2012 0600   CO2 22 12/09/2012 0600   GLUCOSE 97 12/09/2012 0600   BUN 30* 12/09/2012 0600   CREATININE 1.57* 12/09/2012 0600   CALCIUM 9.7 12/09/2012 0600   GFRNONAA 46* 12/09/2012 0600   GFRAA 53* 12/09/2012 0600   A/p Remains stable for transfer.  Creatinine improved.  LFTs about the same  Crista Curb, M.D.

## 2012-12-10 DIAGNOSIS — K7290 Hepatic failure, unspecified without coma: Secondary | ICD-10-CM | POA: Diagnosis present

## 2012-12-10 DIAGNOSIS — K7682 Hepatic encephalopathy: Secondary | ICD-10-CM

## 2012-12-10 HISTORY — DX: Hepatic encephalopathy: K76.82

## 2012-12-10 MED ORDER — LACTULOSE 10 GM/15ML PO SOLN
30.0000 g | Freq: Two times a day (BID) | ORAL | Status: DC
  Administered 2012-12-10 – 2012-12-12 (×6): 30 g via ORAL
  Filled 2012-12-10 (×6): qty 45

## 2012-12-10 MED ORDER — SPIRONOLACTONE 25 MG PO TABS: 25.0000 mg | ORAL_TABLET | Freq: Every day | ORAL | Status: DC

## 2012-12-10 MED ORDER — LACTULOSE 10 GM/15ML PO SOLN: 30.0000 g | Freq: Two times a day (BID) | ORAL | Status: DC

## 2012-12-10 MED ORDER — FUROSEMIDE 40 MG PO TABS: 40.0000 mg | ORAL_TABLET | Freq: Every day | ORAL | Status: DC | PRN

## 2012-12-10 NOTE — Progress Notes (Addendum)
Still not cleared by insurance for skilled nursing facility.  Subjective: Wants to get out of her Filed Vitals:   12/09/12 1739 12/09/12 2100 12/10/12 0500 12/10/12 0923  BP: 124/79 117/76 111/81 103/63  Pulse: 91 97 97 73  Temp: 98 F (36.7 C) 98.4 F (36.9 C) 98.1 F (36.7 C) 98.4 F (36.9 C)  TempSrc: Oral   Oral  Resp: 17 16 18 17   Height:      Weight:   85.322 kg (188 lb 1.6 oz)   SpO2: 100% 99% 98% 99%   Gen: Jaundiced. Talking on the phone.  Seems more anxious and confused Lungs CTA without WRR CV RRR without MGR Abd: S,NT,ND Ext no c/c/e. Asterixis present  BMET    Component Value Date/Time   NA 133* 12/09/2012 0600   K 4.0 12/09/2012 0600   CL 98 12/09/2012 0600   CO2 22 12/09/2012 0600   GLUCOSE 97 12/09/2012 0600   BUN 30* 12/09/2012 0600   CREATININE 1.57* 12/09/2012 0600   CALCIUM 9.7 12/09/2012 0600   GFRNONAA 46* 12/09/2012 0600   GFRAA 53* 12/09/2012 0600   A/p Seems more encephalopathic today. Will add lactulose to Xifaxan. Check CBC, CMET, ammonia level in the morning. Still awaiting placement, a week after placement initiated. will also check B12 and folate levels. The best option remains skilled nursing facility.  Crista Curb, M.D.

## 2012-12-11 ENCOUNTER — Encounter (HOSPITAL_COMMUNITY): Payer: Self-pay | Admitting: Radiology

## 2012-12-11 ENCOUNTER — Inpatient Hospital Stay (HOSPITAL_COMMUNITY): Payer: BC Managed Care – PPO

## 2012-12-11 LAB — COMPREHENSIVE METABOLIC PANEL
ALT: 19 U/L (ref 0–53)
AST: 56 U/L — ABNORMAL HIGH (ref 0–37)
Alkaline Phosphatase: 102 U/L (ref 39–117)
CO2: 19 mEq/L (ref 19–32)
Chloride: 97 mEq/L (ref 96–112)
GFR calc Af Amer: 59 mL/min — ABNORMAL LOW (ref >90)
GFR calc non Af Amer: 51 mL/min — ABNORMAL LOW (ref >90)
Glucose, Bld: 106 mg/dL — ABNORMAL HIGH (ref 70–99)
Sodium: 131 mEq/L — ABNORMAL LOW (ref 135–145)
Total Bilirubin: 3.8 mg/dL — ABNORMAL HIGH (ref 0.3–1.2)

## 2012-12-11 MED ORDER — TRAMADOL HCL 50 MG PO TABS
25.0000 mg | ORAL_TABLET | Freq: Four times a day (QID) | ORAL | Status: DC | PRN
  Administered 2012-12-11 – 2012-12-12 (×2): 25 mg via ORAL
  Filled 2012-12-11 (×2): qty 1

## 2012-12-11 NOTE — Progress Notes (Signed)
Utilization review completed.  

## 2012-12-11 NOTE — Progress Notes (Signed)
Overnight, patient fell.  No injuries noted  Subjective: Wants to get out of here.  Chronic shoulder pain  Filed Vitals:   12/11/12 1300 12/11/12 1500 12/11/12 1700 12/11/12 2109  BP: 116/70 120/68 117/66 121/73  Pulse: 92 96 90 88  Temp: 98.4 F (36.9 C) 98 F (36.7 C) 98.6 F (37 C) 98.5 F (36.9 C)  TempSrc: Oral Oral Oral Oral  Resp: 18 20 18 18   Height:      Weight:    86.3 kg (190 lb 4.1 oz)  SpO2: 98% 98% 98% 98%   Gen: Jaundiced. Comfortable HEENT: normocephalic atraumatic Lungs CTA without WRR CV RRR without MGR Abd: S,NT,ND Ext no c/c/e. Asterixis present  BMET    Component Value Date/Time   NA 131* 12/11/2012 0535   K 3.9 12/11/2012 0535   CL 97 12/11/2012 0535   CO2 19 12/11/2012 0535   GLUCOSE 106* 12/11/2012 0535   BUN 31* 12/11/2012 0535   CREATININE 1.45* 12/11/2012 0535   CALCIUM 9.3 12/11/2012 0535   GFRNONAA 51* 12/11/2012 0535   GFRAA 59* 12/11/2012 0535   A/p SNF v. Home  Crista Curb, M.D.  Patient has been denied?  I got word at 6 pm.  We have been waiting for a decision from [facility] since 5/13.  Discharge in am.  SW to clarify disposition.

## 2012-12-11 NOTE — Progress Notes (Signed)
Patient found on floor face down with upper body in shower. Patient stated he knew he was not supposed to get up without calling and stated he was going to urinate but was unable to before he fell.  It was noted that urine was found in the toilet and on floor.  Patient stated he blacked out. He complains of pain on right side of head "like a thump", pain < 1 on scale 1-10. As well as pain in left shoulder which is not new. Denies back or leg/hip pain. Patient returned to bed via [facility] lift.  Palpitations for tenderness to skull, spine, and hips negative.  Vitals: T 98.4, BP 106/52, HR 98, O2 99% on ra, R 20. Neuro checks WNL. Lenny Pastel, NP notified. CT head ordered. Will continue to monitor patient. Steele Berg RN

## 2012-12-12 ENCOUNTER — Inpatient Hospital Stay (HOSPITAL_COMMUNITY): Payer: BC Managed Care – PPO

## 2012-12-12 DIAGNOSIS — M79609 Pain in unspecified limb: Secondary | ICD-10-CM

## 2012-12-12 DIAGNOSIS — M79641 Pain in right hand: Secondary | ICD-10-CM | POA: Diagnosis not present

## 2012-12-12 MED ORDER — OXYCODONE-ACETAMINOPHEN 5-325 MG PO TABS
1.0000 | ORAL_TABLET | Freq: Once | ORAL | Status: AC
  Administered 2012-12-12: 1 via ORAL
  Filled 2012-12-12: qty 1

## 2012-12-12 MED ORDER — LACTULOSE 10 GM/15ML PO SOLN
30.0000 g | Freq: Every day | ORAL | Status: DC
  Administered 2012-12-13: 30 g via ORAL
  Filled 2012-12-12: qty 45

## 2012-12-12 NOTE — Progress Notes (Signed)
[facility] PROGRESS NOTE  Logan Patrick:[id number] DOB: [birth year] DOA: 11/30/2012 PCP: Default, Provider, MD  Assessment/Plan:  Decompensated cirrhosis of liver  Associated ascites, jaundice, mild thrombocytopenia and coagulopathy.  Secondary to etoh use  salt restricted diet  -US liver shows cirrhosis  -Acute hepatitis panel negative Lasix and aldactone stopped due to AKI Continue rifaximin and lactulo Patient abstinent from etoh only for past 3 wks only. No signs fo withdrawal or encephaloapthy   Bronchitis  Treated  Hypokalemia  resolved  Fall and right hand tenderness:  Check xray  Dispo: after I made multiple calls yesterday and today, SNF denial reversed.  Await placement.  D/c Summary done last week. Will need addendum    Code Status: full  Family Communication:daughter at bedside   Disposition Plan SNF when available  Consultants:  Dr Elnoria Howard  Procedures:  none Antibiotics:  completed  HPI/Subjective: Denies hand pain, but PT noted right wrist and hand tenderness and difficulty ambulating with walker  Objective: Filed Vitals:   12/11/12 2109 12/12/12 0500 12/12/12 0900 12/12/12 1335  BP: 121/73 110/80 112/69 120/75  Pulse: 88 94 89 92  Temp: 98.5 F (36.9 C) 98.5 F (36.9 C) 98.7 F (37.1 C) 98.2 F (36.8 C)  TempSrc: Oral Oral Oral Oral  Resp: 18 18 18 18   Height:      Weight: 86.3 kg (190 lb 4.1 oz)     SpO2: 98% 97% 98% 98%    Intake/Output Summary (Last 24 hours) at 12/12/12 1549 Last data filed at 12/12/12 0531  Gross per 24 hour  Intake    600 ml  Output      0 ml  Net    600 ml   Filed Weights   12/10/12 0500 12/10/12 2130 12/11/12 2109  Weight: 85.322 kg (188 lb 1.6 oz) 86.3 kg (190 lb 4.1 oz) 86.3 kg (190 lb 4.1 oz)    Exam:  General: comfortable. Slightly confused  Chest: CTA without WRR Cardiovascular: RRR without MGR Abdomen: Non distended, non tender, no hepatomegaly, areas of ecchymoses  Musculoskeletal: no  edema.  Right wrist tender and decreased rom Mild asterixis  Data Reviewed: Basic Metabolic Panel:  Recent Labs Lab 12/08/12 0500 12/09/12 0600 12/11/12 0535  NA 133* 133* 131*  K 3.9 4.0 3.9  CL 98 98 97  CO2 25 22 19   GLUCOSE 94 97 106*  BUN 28* 30* 31*  CREATININE 1.75* 1.57* 1.45*  CALCIUM 9.6 9.7 9.3   Liver Function Tests:  Recent Labs Lab 12/09/12 0600 12/11/12 0535  AST 61* 56*  ALT 19 19  ALKPHOS 73 102  BILITOT 4.0* 3.8*  PROT 7.4 7.5  ALBUMIN 2.4* 2.4*   No results found for this basename: LIPASE, AMYLASE,  in the last 168 hours  Recent Labs Lab 12/11/12 0723  AMMONIA 32   CBC:  Recent Labs Lab 12/09/12 0600  WBC 9.0  HGB 10.7*  HCT 30.7*  MCV 102.3*  PLT 94*   Cardiac Enzymes: No results found for this basename: CKTOTAL, CKMB, CKMBINDEX, TROPONINI,  in the last 168 hours BNP (last 3 results) No results found for this basename: PROBNP,  in the last 8760 hours CBG: No results found for this basename: GLUCAP,  in the last 168 hours  No results found for this or any previous visit (from the past 240 hour(s)).   Studies: Dg Elbow Complete Right  12/11/2012   *RADIOLOGY REPORT*  Clinical Data: Recent fall with pain  RIGHT ELBOW - COMPLETE  3+ VIEW  Comparison: None.  Findings: There is elevation of the anterior fat pad consistent with a mild joint effusion.  No evidence of acute fracture is noted.  Posterior fat pad is within normal limits.  Mild degenerative changes are seen.  IMPRESSION: Changes consistent with the joint effusion although no definitive fracture is noted.   Original Report Authenticated By: Alcide Clever, M.D.   Ct Head Wo Contrast  12/11/2012   *RADIOLOGY REPORT*  Clinical Data: Larey Seat.  Hit head.  CT HEAD WITHOUT CONTRAST  Technique:  Contiguous axial images were obtained from the base of the skull through the vertex without contrast.  Comparison: None  Findings: There is advanced cerebral atrophy with ventriculomegaly and  periventricular white matter disease.  No extra-axial fluid collections are identified.  No CT findings for acute hemispheric infarction and/or intracranial hemorrhage.  The brainstem and cerebellum grossly normal.  A giant cisterna magna is noted.  No acute bony findings.  The paranasal sinuses and mastoid air cells are clear.  IMPRESSION:  1.  Age advanced cerebral atrophy, ventriculomegaly and periventricular white matter disease. 2.  No acute intracranial findings or mass lesions.   Original Report Authenticated By: Rudie Meyer, M.D.    Scheduled Meds: . lactulose  30 g Oral BID  . magnesium oxide  400 mg Oral Daily  . pantoprazole  40 mg Oral BID  . rifaximin  550 mg Oral BID  . sodium chloride  3 mL Intravenous Q12H  . thiamine  100 mg Oral Daily   Continuous Infusions:   Time spent: 15 minutes 45 minutes coordinating care with Weston Brass  [facility] Pager [phone] If 7PM-7AM, please contact night-coverage at [email or web address], password [facility] 12/12/2012, 3:49 PM  LOS: 12 days

## 2012-12-12 NOTE — Progress Notes (Signed)
Physical Therapy Treatment Patient Details Name: Logan Patrick MRN: [id number] DOB: 1951/01/11 Today's Date: 12/12/2012 Time: 8469-6295 PT Time Calculation (min): 41 min  PT Assessment / Plan / Recommendation Comments on Treatment Session  Pt presents with significant balance deficits and wiil benefit from continued PT.  Pt present as a significant falls risk and live alone. Pt had significant difficulty negotiating stair and has 2 flights of stairs at home.  Short term SNF would be most appropriate setting for continued rehab.  Pt had a fall episode in the hospital yesterday injuring his right wrist.      Follow Up Recommendations  SNF;Supervision/Assistance - 24 hour     Does the patient have the potential to tolerate intense rehabilitation   yes.   Barriers to Discharge  Lives alone and is a significant falls risk. 2 flights of stairs at home.       Equipment Recommendations  Other (comment) (Pt may benefit from use of single point cane.  )    Recommendations for Other Services    Frequency Min 3X/week   Plan Discharge plan remains appropriate;Frequency needs to be updated    Precautions / Restrictions Precautions Precautions: Fall Precaution Comments: Pt fell in hospital yesterday. Washing face and walking at same time lost my balance.  Pt c/o Right wrist pain.   Restrictions Weight Bearing Restrictions: No   Pertinent Vitals/Pain Pt c/o pain in right wrist and demonstrates difficulty lifting R UE (at the shoulder) but denies pain in shoulder. MMT of R shoulder within normal limits, R hand grip significantly weaker than left.       Mobility  Bed Mobility Bed Mobility: Rolling Right;Rolling Left;Supine to Sit;Sit to Supine Rolling Right: 7: Independent Rolling Left: 7: Independent Supine to Sit: 7: Independent Sitting - Scoot to Edge of Bed: 7: Independent Sit to Supine: 7: Independent Details for Bed Mobility Assistance: No assistance or cueing required.  Pt required  extra effort to rolll to side and transition to sitting without rails.   Transfers Transfers: Sit to Stand;Stand to Sit Sit to Stand: 5: Supervision;From bed;With upper extremity assist Stand to Sit: 7: Independent;To bed;Without upper extremity assist Details for Transfer Assistance: Supervison secondary to pt being a little unsteady initially in standing.   Ambulation/Gait Ambulation/Gait Assistance: 4: Min guard Ambulation Distance (Feet): 200 Feet Assistive device: None Ambulation/Gait Assistance Details: Pt continues to have difficulty avoiding obstacles on his right side.  Pt gait was slow initially.  When cued to increase speed gait was considerable less stable (increased lateral sway and forward lean).   Gait Pattern: Decreased stride length;Trunk rotated posteriorly on left;Wide base of support (Bilateral Hip [place] rotation.  ) Stairs: Yes Stairs Assistance: 4: Min assist Stairs Assistance Details (indicate cue type and reason): VCs for controlled descent to step, pt drops uncontrolled from step to step.  Pt required manual facilitation to avoid falling forward when ascending steps. Pt stated that Right wrist pain prohibited him from using Right rail, but pt did not have the safety awareness to use the Left side rail until instructed to by therapist.   Stair Management Technique: One rail Right;One rail Left;Forwards;Alternating pattern Number of Stairs: 12 Wheelchair Mobility Wheelchair Mobility: No    Exercises     PT Diagnosis:   Abnormal gait, decreased safety awareness and generalized weakness.   PT Problem List:  Pain in right wrist, impaired balance. PT Treatment Interventions:   Neuromuscular re-education, balance training, therapeutic exercise, gait training, therapeutic activity.  PT Goals Acute Rehab PT Goals PT Goal Formulation: With patient Time For Goal Achievement: 12/22/12 Potential to Achieve Goals: Good Pt will go Supine/Side to Sit: Independently;with  HOB 0 degrees PT Goal: Supine/Side to Sit - Progress: Met Pt will go Sit to Stand: Independently;with upper extremity assist;without upper extremity assist PT Goal: Sit to Stand - Progress: Progressing toward goal Pt will Ambulate: >150 feet;Independently;with least restrictive assistive device PT Goal: Ambulate - Progress: Progressing toward goal Additional Goals Additional Goal #1: patient will pick items off ground with no loss of balance PT Goal: Additional Goal #1 - Progress: Met Additional Goal #2: Pt will score within safe range on standardized balance test ( BERG, Dynamic gait).   PT Goal: Additional Goal #2 - Progress: Goal set today  Visit Information  Last PT Received On: 12/12/12 Assistance Needed: +1    Subjective Data  Subjective: I fell yesterday.   Patient Stated Goal: To get stronger   Cognition  Cognition Arousal/Alertness: Awake/alert Behavior During Therapy: WFL for tasks assessed/performed Overall Cognitive Status: History of cognitive impairments - at baseline    Balance  Balance Balance Assessed: Yes Static Standing Balance Static Standing - Balance Support: No upper extremity supported Static Standing - Level of Assistance: 5: Stand by assistance Static Standing - Comment/# of Minutes: Pt stood 30 seconds with marked increase in A-P sway but able to maintain position without assistance.   Single Leg Stance - Right Leg: 10 Single Leg Stance - Left Leg: 10 Tandem Stance - Right Leg: 15 (15 seconds with eyes open.  <10 seconds with eyes closed.  ) Tandem Stance - Left Leg: 15 Rhomberg - Eyes Opened: 15 [place]berg - Eyes Closed: 10 (<10 seconds) Dynamic Standing Balance Dynamic Standing - Balance Support: No upper extremity supported Dynamic Standing - Level of Assistance: 5: Stand by assistance Dynamic Standing - Balance Activities: Forward lean/weight shifting;Lateral lean/weight shifting;Reaching for objects;Reaching across midline Dynamic Standing -  Comments: No LOB noted.    Standardized Balance Assessment Standardized Balance Assessment: Berg Balance Test;Timed Up and Go Test Berg Balance Test Sit to Stand: Able to stand without using hands and stabilize independently Standing Unsupported: Able to stand 2 minutes with supervision Sitting with Back Unsupported but Feet Supported on Floor or Stool: Able to sit safely and securely 2 minutes Stand to Sit: Sits safely with minimal use of hands Transfers: Able to transfer safely, minor use of hands Standing Unsupported with Eyes Closed: Able to stand 3 seconds Standing Ubsupported with Feet Together: Able to place feet together independently but unable to hold for 30 seconds From Standing, Reach Forward with Outstretched Arm: Can reach forward >12 cm safely (5") From Standing Position, Pick up Object from Floor: Able to pick up shoe safely and easily From Standing Position, Turn to Look Behind Over each Shoulder: Looks behind one side only/other side shows less weight shift Turn 360 Degrees: Able to turn 360 degrees safely in 4 seconds or less Standing Unsupported, Alternately Place Feet on Step/Stool: Able to complete 4 steps without aid or supervision Standing Unsupported, One Foot in Front: Loses balance while stepping or standing Standing on One Leg: Able to lift leg independently and hold equal to or more than 3 seconds Total Score: 41 Dynamic Gait Index Level Surface: Mild Impairment Change in Gait Speed: Mild Impairment Gait with Horizontal Head Turns: Moderate Impairment Gait with Vertical Head Turns: Moderate Impairment Gait and Pivot Turn: Mild Impairment Step Over Obstacle: Moderate Impairment Step Around Obstacles: Moderate Impairment  Steps: Severe Impairment Total Score: 10 High Level Balance High Level Balance Activites: Direction changes;Sudden stops High Level Balance Comments: Pt had loss of balance forward with sudden stop  required excessive effort and 2 extra steps  to stop.   End of Session PT - End of Session Equipment Utilized During Treatment: Gait belt Activity Tolerance: Patient tolerated treatment well Patient left: in bed;with bed alarm set;with family/visitor present Nurse Communication: Mobility status   GP     Jeriel Vivanco 12/12/2012, 9:45 AM Theron Arista L. Chilton Si DPT   pager [phone]    Cell [phone]

## 2012-12-12 NOTE — Clinical Social Work Note (Addendum)
Dr. Lendell Caprice made contact today with medical director of [employer] regarding patient and denial decision. CSW later advised by MD that decision reversed and patient has been approved for short-term SNF stay. Call made to [facility] in admissions with [facility] and advised of decision reversal. Tresa Endo informed and advised CSW that she will contact [employer] on tomorrow - 5/21 to negotiate daily rate.    Call made to patient's wife to inform her (she will let daughter know) and patient also advised.  Genelle Bal, MSW, LCSW [phone]

## 2012-12-12 NOTE — Progress Notes (Signed)
12/12/2012 11:21 AM  Phone call made to [employer] around 1022 this am, using the contact info and referral number provided to [place] by the patient's ex wife.  I was on hold for approximately forty minutes.  I hung up and tried again, and remained on hold for an additional 25 minutes with NO ANSWER either call.  This information was relayed to Dr. Lendell Caprice.  DC disposition per Dr. Lendell Caprice at this point as I cannot get ahold of anyone via telephone to answer questions regarding what information they may need or how to file an appeals to a denial.  Will follow. Eunice Blase

## 2012-12-13 MED ORDER — TRAMADOL HCL 50 MG PO TABS: 25.0000 mg | ORAL_TABLET | Freq: Four times a day (QID) | ORAL | Status: DC | PRN

## 2012-12-13 NOTE — Progress Notes (Signed)
Report was call to [facility] facility to [facility].Pt. waiting to be transporter.

## 2012-12-13 NOTE — Discharge Summary (Signed)
Physician Discharge Summary  Logan Patrick ZOX:[id number] DOB: [birth year] DOA: 11/30/2012   Admit date: 11/30/2012 Discharge date: 12/13/2012  Time spent: 40  minutes  Recommendations for Outpatient Follow-up:  1. 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 Patient noted to have mild decompensated cirrhosis and acute bronchitis.    Hospital Course:   Decompensated cirrhosis of liver  - Associated  jaundice, thrombocytopenia and coagulopathy.  - Secondary to etoh use.  Patient was placed on a salt restricted diet.  -US liver shows cirrhosis.   -Acute hepatitis panel negative  Started on lasix and aldactone. Vitamin k given  X 2 for coagulopathy.  Patient was placed  on rifaximin.  Patient abstinent from etoh only for past 2 wks only. No signs fo withdrawal or encephaloapthy  GI Dr Elnoria Howard consulted. Appreciate recommendations. Patient can follow up with him  in 4 weeks Encouraged to quit alcohol  Bronchitis  Completed 5 day course of Levaquin. Symptoms resolved.  Hypokalemia  replenished   Social issue  daughter unable to take care of him at home. Seen by PT. Recommend SNF     Consultants:  Dr Elnoria Howard    Procedures:  none Antibiotics:  levaquin   Discharge Exam: Filed Vitals:   12/12/12 1737 12/12/12 2103 12/13/12 0635 12/13/12 0924  BP: 126/72 120/72 105/65 105/62  Pulse:  88 92 95 89  Temp: 98.3 F (36.8 C) 98.4 F (36.9 C) 98.5 F (36.9 C) 97.8 F (36.6 C)  TempSrc: Oral Oral Oral Oral  Resp: 18 18 18 18   Height:      Weight:  86.3 kg (190 lb 4.1 oz)    SpO2: 99% 97% 96% 96%    General: Middle aged male in NAD  HEENT: no pallor, moist mucosa, icteric  Chest: clear b/l, no added sounds, areas of ecchymosis  Cardiovascular: NS1&S2, no murmurs  Abdomen: Non distended, non tender, no hepatomegaly, areas of ecchymoses  Musculoskeletal: warm ,trace pitting edema b/l ,improved  CNS: AAOX3, no tremors   Discharge Instructions      Discharge Orders   Future Orders Complete By Expires     Diet - low sodium heart healthy  As directed     Diet - low sodium heart healthy  As directed     Diet - low sodium heart healthy  As directed     Discharge instructions  As directed     Comments:      No alcohol. Avoid tylenol products.    Driving Restrictions  As directed     Comments:      No driving until cleared by your doctor    Increase activity slowly  As directed     Increase activity slowly  As directed     Walker   As directed  Medication List    STOP taking these medications       potassium chloride 10 MEQ tablet  Commonly known as:  K-DUR      TAKE these medications       albuterol-ipratropium 18-103 MCG/ACT inhaler  Commonly known as:  COMBIVENT  Inhale 2 puffs into the lungs every 6 (six) hours as needed for wheezing. For wheezing     B COMPLEX-B12 PO  Take 1 tablet by mouth daily.     clotrimazole 1 % cream  Commonly known as:  LOTRIMIN  Apply 1 application topically 2 (two) times daily. Apply to facial rash-avoid eyes     Fluocinolone Acetonide 0.01 % Oil  Place 5 drops into the left ear 2 (two) times daily.     furosemide 40 MG tablet  Commonly known as:  LASIX  Take 1 tablet (40 mg total) by mouth daily as needed (swelling).     lactulose 10 GM/15ML solution  Commonly known as:  CHRONULAC  Take 45 mLs (30 g  total) by mouth 2 (two) times daily.     magnesium oxide 400 MG tablet  Commonly known as:  MAG-OX  Take 400 mg by mouth See admin instructions. Take 1 tab 2 times daily x5 days then 1 tab daily thereafter     pantoprazole 40 MG tablet  Commonly known as:  PROTONIX  Take 40 mg by mouth 2 (two) times daily.     rifaximin 550 MG Tabs  Commonly known as:  XIFAXAN  Take 550 mg by mouth 2 (two) times daily.     spironolactone 25 MG tablet  Commonly known as:  ALDACTONE  Take 1 tablet (25 mg total) by mouth daily.     traMADol 50 MG tablet  Commonly known as:  ULTRAM  Take 0.5 tablets (25 mg total) by mouth every 6 (six) hours as needed.       No Known Allergies Follow-up Information   Follow up with SNF MD.      Schedule an appointment as soon as possible for a visit with Theda Belfast, MD.   Contact information:   [address] [place] [address] [phone]        The results of significant diagnostics from this hospitalization (including imaging, microbiology, ancillary and laboratory) are listed below for reference.    Significant Diagnostic Studies: Dg Chest 2 View  11/30/2012  *RADIOLOGY REPORT*  Clinical Data:  62 year old male with cough, shortness of breath, jaundice.  CHEST - 2 VIEW  Comparison: 04/15/2009 and earlier.  Findings:  Lower lung volumes.  Mild streaky opacity at the lung bases most resembles atelectasis.  Cardiac size and mediastinal contours are within normal limits.  Visualized tracheal air column is within normal limits.  No pneumothorax, pulmonary edema or pleural effusion. No acute osseous abnormality identified.  IMPRESSION: Atelectasis, otherwise no acute cardiopulmonary abnormality.     Original Report Authenticated By: Erskine Speed, M.D.    US Abdomen Complete  12/01/2012  *RADIOLOGY REPORT*  Clinical Data:  Hepatic cirrhosis.  COMPLETE ABDOMINAL ULTRASOUND  Comparison:  None.  Findings:  Gallbladder:  Cholelithiasis is present.   Largest stone is 7 mm. There is no sonographic Murphy's sign.  No wall thickening or pericholecystic fluid.  Gallbladder wall measures 3 mm.  Biliary sludge is also present.  Common bile duct:  5 mm, normal.  Liver:  Findings compatible with hepatic cirrhosis.  No mass lesion.  The liver is hyperechoic when compared to the  adjacent right kidney.  Capsular nodularity is present.  IVC:  Appears normal.  Pancreas:  No mass lesion.  Pancreatic duct not visualized  Spleen:  7.4 cm.Normal echotexture.  Right Kidney:  10.9 cm. Normal echotexture.  Normal central sinus echo complex.  No calculi or hydronephrosis.  Left Kidney:  11.9 cm. Normal echotexture.  Normal central sinus echo complex.  No calculi or hydronephrosis.  Abdominal aorta:  Proximal abdominal aorta measures 2.7 cm.  No aneurysm.  IMPRESSION:  1.  Hepatic cirrhosis.  No mass lesion. 2. Cholelithiasis and biliary sludge without findings of acute cholecystitis.   Original Report Authenticated By: Andreas Newport, M.D.     Microbiology: No results found for this or any previous visit (from the past 240 hour(s)).   Labs: Basic Metabolic Panel:  Recent Labs Lab 12/08/12 0500 12/09/12 0600 12/11/12 0535  NA 133* 133* 131*  K 3.9 4.0 3.9  CL 98 98 97  CO2 25 22 19   GLUCOSE 94 97 106*  BUN 28* 30* 31*  CREATININE 1.75* 1.57* 1.45*  CALCIUM 9.6 9.7 9.3   Liver Function Tests:  Recent Labs Lab 12/09/12 0600 12/11/12 0535  AST 61* 56*  ALT 19 19  ALKPHOS 73 102  BILITOT 4.0* 3.8*  PROT 7.4 7.5  ALBUMIN 2.4* 2.4*   No results found for this basename: LIPASE, AMYLASE,  in the last 168 hours  Recent Labs Lab 12/11/12 0723  AMMONIA 32   CBC:  Recent Labs Lab 12/09/12 0600  WBC 9.0  HGB 10.7*  HCT 30.7*  MCV 102.3*  PLT 94*   Cardiac Enzymes: No results found for this basename: CKTOTAL, CKMB, CKMBINDEX, TROPONINI,  in the last 168 hours BNP: BNP (last 3 results) No results found for this basename: PROBNP,  in the  last 8760 hours CBG: No results found for this basename: GLUCAP,  in the last 168 hours     Signed:  Elizar Alpern  [facility] 12/13/2012, 9:57 AM

## 2014-09-02 ENCOUNTER — Other Ambulatory Visit: Payer: Self-pay | Admitting: Gastroenterology

## 2014-09-02 DIAGNOSIS — K7030 Alcoholic cirrhosis of liver without ascites: Secondary | ICD-10-CM

## 2014-09-11 ENCOUNTER — Ambulatory Visit
Admission: RE | Admit: 2014-09-11 | Discharge: 2014-09-11 | Disposition: A | Discharge status: Home / Self Care | Payer: BLUE CROSS/BLUE SHIELD | Source: Ambulatory Visit | Attending: Gastroenterology | Admitting: Gastroenterology

## 2014-09-11 ENCOUNTER — Encounter (INDEPENDENT_AMBULATORY_CARE_PROVIDER_SITE_OTHER): Payer: Self-pay

## 2014-09-11 DIAGNOSIS — K7030 Alcoholic cirrhosis of liver without ascites: Secondary | ICD-10-CM

## 2015-09-03 ENCOUNTER — Other Ambulatory Visit: Payer: Self-pay | Admitting: Gastroenterology

## 2015-09-03 DIAGNOSIS — K7030 Alcoholic cirrhosis of liver without ascites: Secondary | ICD-10-CM

## 2015-09-23 ENCOUNTER — Ambulatory Visit
Admission: RE | Admit: 2015-09-23 | Discharge: 2015-09-23 | Disposition: A | Discharge status: Home / Self Care | Payer: BLUE CROSS/BLUE SHIELD | Source: Ambulatory Visit | Attending: Gastroenterology | Admitting: Gastroenterology

## 2015-09-23 DIAGNOSIS — K7030 Alcoholic cirrhosis of liver without ascites: Secondary | ICD-10-CM

## 2015-12-11 DIAGNOSIS — I10 Essential (primary) hypertension: Secondary | ICD-10-CM

## 2015-12-11 DIAGNOSIS — E669 Obesity, unspecified: Secondary | ICD-10-CM | POA: Insufficient documentation

## 2015-12-11 HISTORY — DX: Essential (primary) hypertension: I10

## 2016-03-03 ENCOUNTER — Other Ambulatory Visit: Payer: Self-pay | Admitting: Gastroenterology

## 2016-03-03 DIAGNOSIS — K7030 Alcoholic cirrhosis of liver without ascites: Secondary | ICD-10-CM

## 2016-03-15 ENCOUNTER — Ambulatory Visit
Admission: RE | Admit: 2016-03-15 | Discharge: 2016-03-15 | Disposition: A | Discharge status: Home / Self Care | Payer: BLUE CROSS/BLUE SHIELD | Source: Ambulatory Visit | Attending: Gastroenterology | Admitting: Gastroenterology

## 2016-03-15 DIAGNOSIS — K7030 Alcoholic cirrhosis of liver without ascites: Secondary | ICD-10-CM

## 2016-09-07 DIAGNOSIS — K7030 Alcoholic cirrhosis of liver without ascites: Secondary | ICD-10-CM | POA: Diagnosis not present

## 2016-09-07 DIAGNOSIS — E6609 Other obesity due to excess calories: Secondary | ICD-10-CM | POA: Diagnosis not present

## 2016-09-14 DIAGNOSIS — K7030 Alcoholic cirrhosis of liver without ascites: Secondary | ICD-10-CM | POA: Diagnosis not present

## 2017-03-07 ENCOUNTER — Other Ambulatory Visit: Payer: Self-pay | Admitting: Gastroenterology

## 2017-03-07 DIAGNOSIS — K7030 Alcoholic cirrhosis of liver without ascites: Secondary | ICD-10-CM | POA: Diagnosis not present

## 2017-03-07 DIAGNOSIS — E6609 Other obesity due to excess calories: Secondary | ICD-10-CM | POA: Diagnosis not present

## 2017-03-14 ENCOUNTER — Ambulatory Visit
Admission: RE | Admit: 2017-03-14 | Discharge: 2017-03-14 | Disposition: A | Discharge status: Home / Self Care | Payer: BLUE CROSS/BLUE SHIELD | Source: Ambulatory Visit | Attending: Gastroenterology | Admitting: Gastroenterology

## 2017-03-14 ENCOUNTER — Other Ambulatory Visit: Payer: Self-pay | Admitting: Gastroenterology

## 2017-03-14 DIAGNOSIS — K7030 Alcoholic cirrhosis of liver without ascites: Secondary | ICD-10-CM

## 2017-03-14 DIAGNOSIS — K8020 Calculus of gallbladder without cholecystitis without obstruction: Secondary | ICD-10-CM | POA: Diagnosis not present

## 2017-09-12 DIAGNOSIS — R05 Cough: Secondary | ICD-10-CM | POA: Diagnosis not present

## 2017-09-12 DIAGNOSIS — K7030 Alcoholic cirrhosis of liver without ascites: Secondary | ICD-10-CM | POA: Diagnosis not present

## 2017-09-12 DIAGNOSIS — E6609 Other obesity due to excess calories: Secondary | ICD-10-CM | POA: Diagnosis not present

## 2017-09-14 ENCOUNTER — Other Ambulatory Visit: Payer: Self-pay | Admitting: Gastroenterology

## 2017-09-14 DIAGNOSIS — K7030 Alcoholic cirrhosis of liver without ascites: Secondary | ICD-10-CM

## 2017-09-20 DIAGNOSIS — K7030 Alcoholic cirrhosis of liver without ascites: Secondary | ICD-10-CM | POA: Diagnosis not present

## 2017-09-30 ENCOUNTER — Ambulatory Visit
Admission: RE | Admit: 2017-09-30 | Discharge: 2017-09-30 | Disposition: A | Discharge status: Home / Self Care | Payer: Self-pay | Source: Ambulatory Visit | Attending: Gastroenterology | Admitting: Gastroenterology

## 2017-09-30 DIAGNOSIS — K7460 Unspecified cirrhosis of liver: Secondary | ICD-10-CM | POA: Diagnosis not present

## 2017-09-30 DIAGNOSIS — K7030 Alcoholic cirrhosis of liver without ascites: Secondary | ICD-10-CM

## 2017-10-10 DIAGNOSIS — R05 Cough: Secondary | ICD-10-CM | POA: Diagnosis not present

## 2017-10-10 DIAGNOSIS — K7030 Alcoholic cirrhosis of liver without ascites: Secondary | ICD-10-CM | POA: Diagnosis not present

## 2017-10-10 DIAGNOSIS — J22 Unspecified acute lower respiratory infection: Secondary | ICD-10-CM | POA: Diagnosis not present

## 2017-10-11 ENCOUNTER — Other Ambulatory Visit: Payer: Self-pay | Admitting: Family Medicine

## 2017-10-11 ENCOUNTER — Ambulatory Visit
Admission: RE | Admit: 2017-10-11 | Discharge: 2017-10-11 | Disposition: A | Discharge status: Home / Self Care | Payer: Self-pay | Source: Ambulatory Visit | Attending: Family Medicine | Admitting: Family Medicine

## 2017-10-11 DIAGNOSIS — J069 Acute upper respiratory infection, unspecified: Secondary | ICD-10-CM

## 2017-10-11 DIAGNOSIS — R05 Cough: Secondary | ICD-10-CM | POA: Diagnosis not present

## 2017-10-24 DIAGNOSIS — Z0000 Encounter for general adult medical examination without abnormal findings: Secondary | ICD-10-CM | POA: Diagnosis not present

## 2017-11-08 DIAGNOSIS — K7030 Alcoholic cirrhosis of liver without ascites: Secondary | ICD-10-CM | POA: Diagnosis not present

## 2017-11-21 ENCOUNTER — Emergency Department (HOSPITAL_BASED_OUTPATIENT_CLINIC_OR_DEPARTMENT_OTHER): Payer: PPO

## 2017-11-21 ENCOUNTER — Inpatient Hospital Stay (HOSPITAL_BASED_OUTPATIENT_CLINIC_OR_DEPARTMENT_OTHER)
Admission: EM | Admit: 2017-11-21 | Discharge: 2017-12-05 | DRG: 604 | Disposition: A | Discharge status: Other Acute Inpatient Hospital | Payer: PPO | Attending: Family Medicine | Admitting: Family Medicine

## 2017-11-21 ENCOUNTER — Encounter (HOSPITAL_BASED_OUTPATIENT_CLINIC_OR_DEPARTMENT_OTHER): Payer: Self-pay | Admitting: *Deleted

## 2017-11-21 ENCOUNTER — Other Ambulatory Visit: Payer: Self-pay

## 2017-11-21 DIAGNOSIS — R0989 Other specified symptoms and signs involving the circulatory and respiratory systems: Secondary | ICD-10-CM | POA: Diagnosis not present

## 2017-11-21 DIAGNOSIS — G4700 Insomnia, unspecified: Secondary | ICD-10-CM | POA: Diagnosis not present

## 2017-11-21 DIAGNOSIS — R42 Dizziness and giddiness: Secondary | ICD-10-CM | POA: Diagnosis present

## 2017-11-21 DIAGNOSIS — R7989 Other specified abnormal findings of blood chemistry: Secondary | ICD-10-CM | POA: Diagnosis present

## 2017-11-21 DIAGNOSIS — K767 Hepatorenal syndrome: Secondary | ICD-10-CM | POA: Diagnosis not present

## 2017-11-21 DIAGNOSIS — I472 Ventricular tachycardia: Secondary | ICD-10-CM | POA: Diagnosis not present

## 2017-11-21 DIAGNOSIS — K7460 Unspecified cirrhosis of liver: Secondary | ICD-10-CM | POA: Diagnosis not present

## 2017-11-21 DIAGNOSIS — L8990 Pressure ulcer of unspecified site, unspecified stage: Secondary | ICD-10-CM | POA: Diagnosis present

## 2017-11-21 DIAGNOSIS — D6959 Other secondary thrombocytopenia: Secondary | ICD-10-CM | POA: Diagnosis not present

## 2017-11-21 DIAGNOSIS — W010XXA Fall on same level from slipping, tripping and stumbling without subsequent striking against object, initial encounter: Secondary | ICD-10-CM | POA: Diagnosis present

## 2017-11-21 DIAGNOSIS — D689 Coagulation defect, unspecified: Secondary | ICD-10-CM | POA: Diagnosis not present

## 2017-11-21 DIAGNOSIS — E43 Unspecified severe protein-calorie malnutrition: Secondary | ICD-10-CM | POA: Diagnosis present

## 2017-11-21 DIAGNOSIS — J9690 Respiratory failure, unspecified, unspecified whether with hypoxia or hypercapnia: Secondary | ICD-10-CM | POA: Diagnosis not present

## 2017-11-21 DIAGNOSIS — K7031 Alcoholic cirrhosis of liver with ascites: Secondary | ICD-10-CM | POA: Diagnosis present

## 2017-11-21 DIAGNOSIS — E46 Unspecified protein-calorie malnutrition: Secondary | ICD-10-CM | POA: Diagnosis not present

## 2017-11-21 DIAGNOSIS — R578 Other shock: Secondary | ICD-10-CM | POA: Diagnosis not present

## 2017-11-21 DIAGNOSIS — Z6838 Body mass index (BMI) 38.0-38.9, adult: Secondary | ICD-10-CM

## 2017-11-21 DIAGNOSIS — E876 Hypokalemia: Secondary | ICD-10-CM | POA: Diagnosis not present

## 2017-11-21 DIAGNOSIS — Z87891 Personal history of nicotine dependence: Secondary | ICD-10-CM | POA: Diagnosis not present

## 2017-11-21 DIAGNOSIS — F1010 Alcohol abuse, uncomplicated: Secondary | ICD-10-CM | POA: Diagnosis not present

## 2017-11-21 DIAGNOSIS — R34 Anuria and oliguria: Secondary | ICD-10-CM | POA: Diagnosis not present

## 2017-11-21 DIAGNOSIS — K219 Gastro-esophageal reflux disease without esophagitis: Secondary | ICD-10-CM | POA: Diagnosis not present

## 2017-11-21 DIAGNOSIS — R32 Unspecified urinary incontinence: Secondary | ICD-10-CM | POA: Diagnosis present

## 2017-11-21 DIAGNOSIS — R748 Abnormal levels of other serum enzymes: Secondary | ICD-10-CM

## 2017-11-21 DIAGNOSIS — Y990 Civilian activity done for income or pay: Secondary | ICD-10-CM

## 2017-11-21 DIAGNOSIS — S3991XA Unspecified injury of abdomen, initial encounter: Secondary | ICD-10-CM | POA: Diagnosis not present

## 2017-11-21 DIAGNOSIS — E1165 Type 2 diabetes mellitus with hyperglycemia: Secondary | ICD-10-CM | POA: Diagnosis not present

## 2017-11-21 DIAGNOSIS — I248 Other forms of acute ischemic heart disease: Secondary | ICD-10-CM | POA: Diagnosis not present

## 2017-11-21 DIAGNOSIS — I361 Nonrheumatic tricuspid (valve) insufficiency: Secondary | ICD-10-CM | POA: Diagnosis not present

## 2017-11-21 DIAGNOSIS — I8500 Esophageal varices without bleeding: Secondary | ICD-10-CM | POA: Diagnosis not present

## 2017-11-21 DIAGNOSIS — K7030 Alcoholic cirrhosis of liver without ascites: Secondary | ICD-10-CM | POA: Diagnosis present

## 2017-11-21 DIAGNOSIS — I871 Compression of vein: Secondary | ICD-10-CM | POA: Diagnosis not present

## 2017-11-21 DIAGNOSIS — D62 Acute posthemorrhagic anemia: Secondary | ICD-10-CM | POA: Diagnosis present

## 2017-11-21 DIAGNOSIS — K5900 Constipation, unspecified: Secondary | ICD-10-CM | POA: Diagnosis not present

## 2017-11-21 DIAGNOSIS — E669 Obesity, unspecified: Secondary | ICD-10-CM | POA: Diagnosis not present

## 2017-11-21 DIAGNOSIS — K559 Vascular disorder of intestine, unspecified: Secondary | ICD-10-CM | POA: Diagnosis not present

## 2017-11-21 DIAGNOSIS — R571 Hypovolemic shock: Secondary | ICD-10-CM | POA: Diagnosis not present

## 2017-11-21 DIAGNOSIS — I951 Orthostatic hypotension: Secondary | ICD-10-CM | POA: Diagnosis present

## 2017-11-21 DIAGNOSIS — E8770 Fluid overload, unspecified: Secondary | ICD-10-CM | POA: Diagnosis not present

## 2017-11-21 DIAGNOSIS — D696 Thrombocytopenia, unspecified: Secondary | ICD-10-CM | POA: Diagnosis present

## 2017-11-21 DIAGNOSIS — N390 Urinary tract infection, site not specified: Secondary | ICD-10-CM | POA: Diagnosis present

## 2017-11-21 DIAGNOSIS — R4020 Unspecified coma: Secondary | ICD-10-CM | POA: Diagnosis not present

## 2017-11-21 DIAGNOSIS — R4182 Altered mental status, unspecified: Secondary | ICD-10-CM | POA: Diagnosis not present

## 2017-11-21 DIAGNOSIS — D649 Anemia, unspecified: Secondary | ICD-10-CM | POA: Diagnosis not present

## 2017-11-21 DIAGNOSIS — F05 Delirium due to known physiological condition: Secondary | ICD-10-CM | POA: Diagnosis not present

## 2017-11-21 DIAGNOSIS — I9589 Other hypotension: Secondary | ICD-10-CM

## 2017-11-21 DIAGNOSIS — S7002XA Contusion of left hip, initial encounter: Secondary | ICD-10-CM | POA: Diagnosis not present

## 2017-11-21 DIAGNOSIS — K8020 Calculus of gallbladder without cholecystitis without obstruction: Secondary | ICD-10-CM | POA: Diagnosis present

## 2017-11-21 DIAGNOSIS — S7012XA Contusion of left thigh, initial encounter: Principal | ICD-10-CM | POA: Diagnosis present

## 2017-11-21 DIAGNOSIS — R569 Unspecified convulsions: Secondary | ICD-10-CM | POA: Diagnosis present

## 2017-11-21 DIAGNOSIS — E871 Hypo-osmolality and hyponatremia: Secondary | ICD-10-CM | POA: Diagnosis not present

## 2017-11-21 DIAGNOSIS — R000 Tachycardia, unspecified: Secondary | ICD-10-CM | POA: Diagnosis present

## 2017-11-21 DIAGNOSIS — K7040 Alcoholic hepatic failure without coma: Secondary | ICD-10-CM | POA: Diagnosis not present

## 2017-11-21 DIAGNOSIS — I4581 Long QT syndrome: Secondary | ICD-10-CM | POA: Diagnosis not present

## 2017-11-21 DIAGNOSIS — Z79899 Other long term (current) drug therapy: Secondary | ICD-10-CM

## 2017-11-21 DIAGNOSIS — Z6841 Body Mass Index (BMI) 40.0 and over, adult: Secondary | ICD-10-CM | POA: Diagnosis not present

## 2017-11-21 DIAGNOSIS — R778 Other specified abnormalities of plasma proteins: Secondary | ICD-10-CM | POA: Diagnosis present

## 2017-11-21 DIAGNOSIS — N179 Acute kidney failure, unspecified: Secondary | ICD-10-CM | POA: Diagnosis not present

## 2017-11-21 DIAGNOSIS — R17 Unspecified jaundice: Secondary | ICD-10-CM | POA: Diagnosis not present

## 2017-11-21 DIAGNOSIS — S79912A Unspecified injury of left hip, initial encounter: Secondary | ICD-10-CM | POA: Diagnosis not present

## 2017-11-21 DIAGNOSIS — M25552 Pain in left hip: Secondary | ICD-10-CM

## 2017-11-21 DIAGNOSIS — E861 Hypovolemia: Secondary | ICD-10-CM

## 2017-11-21 DIAGNOSIS — R0602 Shortness of breath: Secondary | ICD-10-CM | POA: Diagnosis not present

## 2017-11-21 DIAGNOSIS — N170 Acute kidney failure with tubular necrosis: Secondary | ICD-10-CM | POA: Diagnosis not present

## 2017-11-21 DIAGNOSIS — E872 Acidosis: Secondary | ICD-10-CM | POA: Diagnosis not present

## 2017-11-21 DIAGNOSIS — E1152 Type 2 diabetes mellitus with diabetic peripheral angiopathy with gangrene: Secondary | ICD-10-CM | POA: Diagnosis not present

## 2017-11-21 DIAGNOSIS — K766 Portal hypertension: Secondary | ICD-10-CM | POA: Diagnosis not present

## 2017-11-21 DIAGNOSIS — I10 Essential (primary) hypertension: Secondary | ICD-10-CM | POA: Diagnosis present

## 2017-11-21 DIAGNOSIS — R55 Syncope and collapse: Secondary | ICD-10-CM | POA: Diagnosis not present

## 2017-11-21 DIAGNOSIS — K7290 Hepatic failure, unspecified without coma: Secondary | ICD-10-CM | POA: Diagnosis not present

## 2017-11-21 DIAGNOSIS — I959 Hypotension, unspecified: Secondary | ICD-10-CM | POA: Diagnosis not present

## 2017-11-21 DIAGNOSIS — E1122 Type 2 diabetes mellitus with diabetic chronic kidney disease: Secondary | ICD-10-CM | POA: Diagnosis not present

## 2017-11-21 DIAGNOSIS — N289 Disorder of kidney and ureter, unspecified: Secondary | ICD-10-CM | POA: Diagnosis not present

## 2017-11-21 DIAGNOSIS — Z8619 Personal history of other infectious and parasitic diseases: Secondary | ICD-10-CM | POA: Diagnosis not present

## 2017-11-21 DIAGNOSIS — N186 End stage renal disease: Secondary | ICD-10-CM | POA: Diagnosis not present

## 2017-11-21 DIAGNOSIS — K7210 Chronic hepatic failure without coma: Secondary | ICD-10-CM | POA: Diagnosis not present

## 2017-11-21 DIAGNOSIS — M7989 Other specified soft tissue disorders: Secondary | ICD-10-CM | POA: Diagnosis not present

## 2017-11-21 DIAGNOSIS — I120 Hypertensive chronic kidney disease with stage 5 chronic kidney disease or end stage renal disease: Secondary | ICD-10-CM | POA: Diagnosis not present

## 2017-11-21 DIAGNOSIS — Z794 Long term (current) use of insulin: Secondary | ICD-10-CM | POA: Diagnosis not present

## 2017-11-21 HISTORY — DX: Hypotension, unspecified: I95.9

## 2017-11-21 HISTORY — DX: Thrombocytopenia, unspecified: D69.6

## 2017-11-21 HISTORY — DX: Alcoholic cirrhosis of liver without ascites: K70.30

## 2017-11-21 LAB — CBC WITH DIFFERENTIAL/PLATELET
BASOS PCT: 0 %
Basophils Absolute: 0.1 10*3/uL (ref 0.0–0.1)
EOS ABS: 0.1 10*3/uL (ref 0.0–0.7)
Eosinophils Relative: 1 %
HCT: 30.3 % — ABNORMAL LOW (ref 39.0–52.0)
Hemoglobin: 10.3 g/dL — ABNORMAL LOW (ref 13.0–17.0)
Lymphocytes Relative: 14 %
Lymphs Abs: 1.6 10*3/uL (ref 0.7–4.0)
MCH: 35.9 pg — ABNORMAL HIGH (ref 26.0–34.0)
MCHC: 34 g/dL (ref 30.0–36.0)
MCV: 105.6 fL — ABNORMAL HIGH (ref 78.0–100.0)
MONO ABS: 0.9 10*3/uL (ref 0.1–1.0)
MONOS PCT: 8 %
Neutro Abs: 8.8 10*3/uL — ABNORMAL HIGH (ref 1.7–7.7)
Neutrophils Relative %: 77 %
Platelets: 132 10*3/uL — ABNORMAL LOW (ref 150–400)
RBC: 2.87 MIL/uL — ABNORMAL LOW (ref 4.22–5.81)
RDW: 17.4 % — AB (ref 11.5–15.5)
WBC: 11.5 10*3/uL — ABNORMAL HIGH (ref 4.0–10.5)

## 2017-11-21 LAB — RAPID URINE DRUG SCREEN, HOSP PERFORMED
Amphetamines: NOT DETECTED
Barbiturates: NOT DETECTED
Benzodiazepines: NOT DETECTED
Cocaine: NOT DETECTED
OPIATES: POSITIVE — AB
TETRAHYDROCANNABINOL: NOT DETECTED

## 2017-11-21 LAB — URINALYSIS, ROUTINE W REFLEX MICROSCOPIC
Glucose, UA: 100 mg/dL — AB
KETONES UR: 15 mg/dL — AB
Leukocytes, UA: NEGATIVE
NITRITE: POSITIVE — AB
PH: 5.5 (ref 5.0–8.0)
Protein, ur: 30 mg/dL — AB
Specific Gravity, Urine: 1.025 (ref 1.005–1.030)

## 2017-11-21 LAB — COMPREHENSIVE METABOLIC PANEL
ALBUMIN: 2.2 g/dL — AB (ref 3.5–5.0)
ALT: 35 U/L (ref 17–63)
ANION GAP: 11 (ref 5–15)
AST: 81 U/L — ABNORMAL HIGH (ref 15–41)
Alkaline Phosphatase: 143 U/L — ABNORMAL HIGH (ref 38–126)
BILIRUBIN TOTAL: 10.8 mg/dL — AB (ref 0.3–1.2)
BUN: 10 mg/dL (ref 6–20)
CO2: 18 mmol/L — AB (ref 22–32)
Calcium: 8.4 mg/dL — ABNORMAL LOW (ref 8.9–10.3)
Chloride: 107 mmol/L (ref 101–111)
Creatinine, Ser: 0.99 mg/dL (ref 0.61–1.24)
GFR calc Af Amer: >60 mL/min (ref >60)
GFR calc non Af Amer: >60 mL/min (ref >60)
GLUCOSE: 150 mg/dL — AB (ref 65–99)
POTASSIUM: 3.2 mmol/L — AB (ref 3.5–5.1)
SODIUM: 136 mmol/L (ref 135–145)
TOTAL PROTEIN: 5.8 g/dL — AB (ref 6.5–8.1)

## 2017-11-21 LAB — URINALYSIS, MICROSCOPIC (REFLEX)

## 2017-11-21 LAB — PROTIME-INR
INR: 2.75
PROTHROMBIN TIME: 28.9 s — AB (ref 11.4–15.2)

## 2017-11-21 LAB — I-STAT CG4 LACTIC ACID, ED: LACTIC ACID, VENOUS: 3.23 mmol/L — AB (ref 0.5–1.9)

## 2017-11-21 LAB — TROPONIN I: Troponin I: 0.03 ng/mL (ref <0.03)

## 2017-11-21 LAB — LIPASE, BLOOD: LIPASE: 31 U/L (ref 11–51)

## 2017-11-21 LAB — AMMONIA: Ammonia: 75 umol/L — ABNORMAL HIGH (ref 9–35)

## 2017-11-21 LAB — CBG MONITORING, ED: Glucose-Capillary: 138 mg/dL — ABNORMAL HIGH (ref 65–99)

## 2017-11-21 MED ORDER — SODIUM CHLORIDE 0.9 % IV BOLUS
1000.0000 mL | Freq: Once | INTRAVENOUS | Status: AC
  Administered 2017-11-21: 1000 mL via INTRAVENOUS

## 2017-11-21 MED ORDER — SODIUM CHLORIDE 0.9% FLUSH: 3.0000 mL | INTRAVENOUS | Status: DC | PRN

## 2017-11-21 MED ORDER — PRO-STAT SUGAR FREE PO LIQD
30.0000 mL | Freq: Two times a day (BID) | ORAL | Status: DC
  Administered 2017-11-21 – 2017-12-05 (×24): 30 mL via ORAL
  Filled 2017-11-21 (×24): qty 30

## 2017-11-21 MED ORDER — POTASSIUM CHLORIDE CRYS ER 20 MEQ PO TBCR
20.0000 meq | EXTENDED_RELEASE_TABLET | Freq: Once | ORAL | Status: AC
  Administered 2017-11-21: 20 meq via ORAL
  Filled 2017-11-21: qty 1

## 2017-11-21 MED ORDER — RIFAXIMIN 550 MG PO TABS
550.0000 mg | ORAL_TABLET | Freq: Two times a day (BID) | ORAL | Status: DC
  Administered 2017-11-21 – 2017-12-05 (×28): 550 mg via ORAL
  Filled 2017-11-21 (×29): qty 1

## 2017-11-21 MED ORDER — MORPHINE SULFATE (PF) 4 MG/ML IV SOLN
4.0000 mg | Freq: Once | INTRAVENOUS | Status: AC
Start: 2017-11-21 — End: 2017-11-21
  Administered 2017-11-21: 4 mg via INTRAVENOUS
  Filled 2017-11-21: qty 1

## 2017-11-21 MED ORDER — LACTULOSE 10 GM/15ML PO SOLN
30.0000 g | Freq: Every day | ORAL | Status: DC
  Administered 2017-11-21 – 2017-11-24 (×3): 30 g via ORAL
  Filled 2017-11-21 (×3): qty 45

## 2017-11-21 MED ORDER — ONDANSETRON HCL 4 MG/2ML IJ SOLN
4.0000 mg | Freq: Once | INTRAMUSCULAR | Status: AC
  Administered 2017-11-21: 4 mg via INTRAVENOUS
  Filled 2017-11-21: qty 2

## 2017-11-21 MED ORDER — SODIUM CHLORIDE 0.9% FLUSH
3.0000 mL | Freq: Two times a day (BID) | INTRAVENOUS | Status: DC
  Administered 2017-11-21 – 2017-11-22 (×2): 3 mL via INTRAVENOUS

## 2017-11-21 MED ORDER — SODIUM CHLORIDE 0.9 % IV SOLN: 250.0000 mL | INTRAVENOUS | Status: DC | PRN

## 2017-11-21 MED ORDER — IOPAMIDOL (ISOVUE-300) INJECTION 61%
100.0000 mL | Freq: Once | INTRAVENOUS | Status: AC | PRN
  Administered 2017-11-21: 100 mL via INTRAVENOUS

## 2017-11-21 MED ORDER — SODIUM CHLORIDE 0.9 % IV SOLN
2.0000 g | Freq: Once | INTRAVENOUS | Status: AC
  Administered 2017-11-21: 2 g via INTRAVENOUS
  Filled 2017-11-21: qty 20

## 2017-11-21 MED ORDER — PANTOPRAZOLE SODIUM 40 MG PO TBEC
40.0000 mg | DELAYED_RELEASE_TABLET | Freq: Two times a day (BID) | ORAL | Status: DC
  Administered 2017-11-21 – 2017-12-05 (×28): 40 mg via ORAL
  Filled 2017-11-21 (×30): qty 1

## 2017-11-21 MED ORDER — IOPAMIDOL (ISOVUE-300) INJECTION 61%: 15.0000 mL | Freq: Once | INTRAVENOUS | Status: DC | PRN

## 2017-11-21 MED ORDER — IPRATROPIUM-ALBUTEROL 0.5-2.5 (3) MG/3ML IN SOLN: 3.0000 mL | Freq: Four times a day (QID) | RESPIRATORY_TRACT | Status: DC | PRN

## 2017-11-21 NOTE — ED Notes (Signed)
ED Provider at bedside. 

## 2017-11-21 NOTE — ED Notes (Signed)
Attempt to call report to [place] at [facility], nurse not available , [facility] number left for call back

## 2017-11-21 NOTE — ED Triage Notes (Addendum)
Pt c/o fall x 1 day ago with left hip and thigh pain , pt states severe brusing  and swelling

## 2017-11-21 NOTE — ED Notes (Signed)
Family at bedside. 

## 2017-11-21 NOTE — Progress Notes (Signed)
68 yo male with cirrhosis s/p fall at home, and then in ER waiting room felt poorly had near syncope, and hypotension, and received fluid in Ed.  2L   Trop 0.03 EKG ST at 100, nl axis no st- t changes c/w ischemia.   ED, will monitor bp , if remains hypotensive they will call PCCM to possibly admit this patient otherwise stepdown

## 2017-11-21 NOTE — ED Notes (Signed)
Called to ED [place] lobby to check on pt feeling like he is going to pass out-pt seated in w/c-taken into ED triage-states he feels he is going to pass out-pale-jaundice sclera-denies hx of syncope and DM-while talking with pt he became unresponsive and had seizure activity-pt taken to tx room 3

## 2017-11-21 NOTE — ED Notes (Signed)
Returned from CT.

## 2017-11-21 NOTE — ED Provider Notes (Signed)
[facility] EMERGENCY DEPARTMENT Provider Note   CSN: [id number] Arrival date & time: 11/21/17  1416     History   Chief Complaint Chief Complaint  Patient presents with  . Fall    HPI Logan Patrick is a 67 y.o. male.  He presented to triage complaint of fall yesterday.  He was at work and tripped over something and fell to the ground and laid there for about 20 minutes requiring some assistance to get back up.  States he was able to ambulate well and drove home and did not have much pain.  Today he was back at work and felt the leg was starting to hurt him more so he went back home but he noticed how swollen and purple it was.  He had taken some Motrin.  He was brought in by his ex-wife and was able to ambulate into the department.  He had fairly good vitals out of triage and appeared in no distress.  I was called immediately into room 3 when the patient was brought in from triage unresponsive.  Apparently he was waiting in the waiting room and his ex-wife alerted staff that he was not feeling well.  Patient was completely unresponsive and rigid.  There was no obvious seizure activity.  The history is provided by the patient.  Fall  This is a new problem. The current episode started yesterday. The problem has been gradually worsening. Pertinent negatives include no chest pain, no abdominal pain, no headaches and no shortness of breath. The symptoms are aggravated by walking. The symptoms are relieved by rest and NSAIDs. He has tried rest for the symptoms. The treatment provided mild relief.    Past Medical History:  Diagnosis Date  . Hypertension   . Liver failure ([place])   . Pneumonia     Patient Active Problem List   Diagnosis Date Noted  . Right hand pain 12/12/2012  . Encephalopathy, hepatic ([place]) 12/10/2012  . Acute renal insufficiency 12/08/2012  . Failure to thrive 12/05/2012  . Hypokalemia 12/02/2012  . Bronchitis 11/30/2012  . Cirrhosis of liver ([place])  11/30/2012  . Hematuria 11/30/2012  . Anemia 11/30/2012    History reviewed. No pertinent surgical history.      Home Medications    Prior to Admission medications   Medication Sig Start Date End Date Taking? Authorizing Provider  ibuprofen (ADVIL,MOTRIN) 200 MG tablet Take 200 mg by mouth every 6 (six) hours as needed.   Yes [provider]  albuterol-ipratropium (COMBIVENT) 18-103 MCG/ACT inhaler Inhale 2 puffs into the lungs every 6 (six) hours as needed for wheezing. For wheezing    [provider]  B Complex Vitamins (B COMPLEX-B12 PO) Take 1 tablet by mouth daily.    [provider]  clotrimazole (LOTRIMIN) 1 % cream Apply 1 application topically 2 (two) times daily. Apply to facial rash-avoid eyes    [provider]  Fluocinolone Acetonide 0.01 % OIL Place 5 drops into the left ear 2 (two) times daily.    [provider]  magnesium oxide (MAG-OX) 400 MG tablet Take 400 mg by mouth See admin instructions. Take 1 tab 2 times daily x5 days then 1 tab daily thereafter    [provider]  pantoprazole (PROTONIX) 40 MG tablet Take 40 mg by mouth 2 (two) times daily.    [provider]  rifaximin (XIFAXAN) 550 MG TABS Take 550 mg by mouth 2 (two) times daily.    [provider]  Family History Family History  Problem Relation Age of Onset  . Diabetes type II Mother     Social History Social History   Tobacco Use  . Smoking status: Former Smoker    Last attempt to quit: 11/30/1976    Years since quitting: 41.0  . Smokeless tobacco: Never Used  Substance Use Topics  . Alcohol use: No  . Drug use: No     Allergies   Patient has no known allergies.   Review of Systems Review of Systems  Constitutional: Negative for chills and fever.  HENT: Negative for rhinorrhea and sore throat.   Eyes: Negative for pain and visual disturbance.  Respiratory: Negative for cough and shortness of breath.     Cardiovascular: Negative for chest pain.  Gastrointestinal: Positive for nausea and vomiting. Negative for abdominal pain, blood in stool and constipation.  Genitourinary: Negative for dysuria and frequency.  Musculoskeletal: Negative for back pain and neck pain.  Skin: Positive for color change. Negative for rash.  Neurological: Negative for seizures, syncope and headaches.     Physical Exam Updated Vital Signs BP 99/76   Pulse (!) 105   Temp 97.6 F (36.4 C) (Oral)   Resp 18   Ht 5\' 8"  (1.727 m)   Wt 120.2 kg (265 lb)   SpO2 100%   BMI 40.29 kg/m   Physical Exam  Constitutional: He is oriented to person, place, and time. He appears well-developed and well-nourished.  HENT:  Head: Normocephalic and atraumatic.  Right Ear: External ear normal.  Left Ear: External ear normal.  Nose: Nose normal.  Mouth/Throat: Oropharynx is clear and moist.  Eyes: Pupils are equal, round, and reactive to light. EOM are normal. Scleral icterus is present.  Neck: Normal range of motion. Neck supple. No tracheal deviation present.  Cardiovascular: Normal rate, regular rhythm, normal heart sounds and intact distal pulses.  Pulmonary/Chest: Effort normal and breath sounds normal. No respiratory distress. He has no rales.  Abdominal: Soft. He exhibits no mass. There is no tenderness. There is no rebound.  Musculoskeletal: Normal range of motion. He exhibits edema and tenderness.  Market ecchymosis and swelling of left upper thigh.  Area is fairly tense.  Neurological: He is alert and oriented to person, place, and time. He exhibits normal muscle tone.  Skin: Skin is warm. Capillary refill takes less than 2 seconds. He is diaphoretic.  jaundiced     ED Treatments / Results  Labs (all labs ordered are listed, but only abnormal results are displayed) Labs Reviewed  CBG MONITORING, ED - Abnormal; Notable for the following components:      Result Value   Glucose-Capillary 138 (*)    All other  components within normal limits  CBC WITH DIFFERENTIAL/PLATELET  COMPREHENSIVE METABOLIC PANEL  TROPONIN I  LIPASE, BLOOD  PROTIME-INR  AMMONIA  RAPID URINE DRUG SCREEN, HOSP PERFORMED  URINALYSIS, ROUTINE W REFLEX MICROSCOPIC    EKG EKG Interpretation  Date/Time:  Monday November 21 2017 15:29:46 EDT Ventricular Rate:  99 PR Interval:    QRS Duration: 83 QT Interval:  396 QTC Calculation: 509 R Axis:   71 Text Interpretation:  Sinus rhythm Prolonged QT interval new QTc prolongation from prior Confirmed by Aletta Edouard [phone]) on 11/21/2017 6:44:51 PM   Radiology Dg Chest 1 View  Result Date: 11/21/2017 CLINICAL DATA:  Near syncope with jaundice EXAM: CHEST  1 VIEW COMPARISON:  10/11/2017 FINDINGS: Possible trace left effusion. No focal consolidation. Stable cardiomediastinal silhouette with mild vascular congestion.  Mild diffuse interstitial opacity compared to prior. No pneumothorax. IMPRESSION: 1. Probable tiny left effusion 2. Minimal vascular congestion. Mild diffuse increased interstitial opacity compared to prior, possible mild edema. Electronically Signed   By: Donavan Foil M.D.   On: 11/21/2017 18:38   Mr Brain Wo Contrast  Result Date: 11/22/2017 CLINICAL DATA:  Altered level of consciousness. Liver failure cirrhosis EXAM: MRI HEAD WITHOUT CONTRAST TECHNIQUE: Multiplanar, multiecho pulse sequences of the brain and surrounding structures were obtained without intravenous contrast. COMPARISON:  CT head 11/21/2017 FINDINGS: Brain: Moderate atrophy. Ventricular enlargement consistent with atrophy. Negative for acute infarct. Minimal chronic changes in the white matter. Negative for hemorrhage, mass, or fluid collection Vascular: Normal arterial flow voids Skull and upper cervical spine: Negative Sinuses/Orbits: Negative Other: None IMPRESSION: Moderate atrophy.  No acute intracranial abnormality. Electronically Signed   By: Franchot Gallo M.D.   On: 11/22/2017 10:39   Ct  Abdomen Pelvis W Contrast  Result Date: 11/21/2017 CLINICAL DATA:  Near syncope, jaundice, fall. History of liver failure. EXAM: CT ABDOMEN AND PELVIS WITH CONTRAST TECHNIQUE: Multidetector CT imaging of the abdomen and pelvis was performed using the standard protocol following bolus administration of intravenous contrast. CONTRAST:  130mL ISOVUE-300 IOPAMIDOL (ISOVUE-300) INJECTION 61% COMPARISON:  Right upper quadrant ultrasound dated 09/30/2017 FINDINGS: Lower chest: Trace left pleural effusion. Hepatobiliary: Cirrhosis.  No focal hepatic lesion is seen. Cholelithiasis, without associated inflammatory changes. No intrahepatic or extrahepatic ductal dilatation. Pancreas: Mild fluid/ascites along the pancreatic tail (series 2/image 30), nonspecific. No pancreatic ductal dilatation or atrophy. Spleen: Spleen is normal in size. Adrenals/Urinary Tract: Adrenal glands are within normal limits. Kidneys are within normal limits. No hydronephrosis. Bladder is underdistended but unremarkable. Stomach/Bowel: Stomach is within normal limits. No evidence of bowel obstruction. Normal appendix (series 2/image 45). Mild sigmoid diverticulosis, without evidence of diverticulitis. Vascular/Lymphatic: No evidence of abdominal aortic aneurysm. Atherosclerotic calcifications of the abdominal aorta and branch vessels. Dominant splenorenal shunt.  Diminutive portal vein, patent. No suspicious abdominopelvic lymphadenopathy. Reproductive: Prostate is unremarkable. Other: Small volume abdominopelvic ascites, predominantly perihepatic and perisplenic. Fat/fluid in the left inguinal canal. Musculoskeletal: Visualized osseous structures are within normal limits. IMPRESSION: Mild fluid/ascites along the pancreatic tail, nonspecific. Mild acute pancreatitis is not excluded. However, this may also be related to the patient's abdominopelvic ascites. Cirrhosis. Portal vein is diminutive but patent. Dominant splenorenal shunt. Small volume  abdominopelvic ascites. Cholelithiasis, without associated inflammatory changes. Trace left pleural effusion. Electronically Signed   By: Julian Hy M.D.   On: 11/21/2017 18:40    Procedures .Critical Care Performed by: Hayden Rasmussen, MD Authorized by: Hayden Rasmussen, MD   Critical care provider statement:    Critical care time (minutes):  45   Critical care was necessary to treat or prevent imminent or life-threatening deterioration of the following conditions:  Circulatory failure, CNS failure or compromise, metabolic crisis, shock and trauma   Critical care was time spent personally by me on the following activities:  Development of treatment plan with patient or surrogate, discussions with consultants, evaluation of patient's response to treatment, examination of patient, obtaining history from patient or surrogate, ordering and performing treatments and interventions, ordering and review of laboratory studies, ordering and review of radiographic studies, pulse oximetry, re-evaluation of patient's condition and review of old charts   I assumed direction of critical care for this patient from another provider in my specialty: no     (including critical care time)  Medications Ordered in ED Medications  sodium chloride  0.9 % bolus 1,000 mL (1,000 mLs Intravenous New Bag/Given 11/21/17 1534)  ondansetron (ZOFRAN) injection 4 mg (4 mg Intravenous Given 11/21/17 1535)     Initial Impression / Assessment and Plan / ED Course  I have reviewed the triage vital signs and the nursing notes.  Pertinent labs & imaging results that were available during my care of the patient were reviewed by me and considered in my medical decision making (see chart for details).  Clinical Course as of Nov 24 1703  Mon Nov 21, 2017  1542 Patient was lifted up in the bed placed on a nonrebreather and IV access was established.  Over the course of about 60 seconds he became more responsive and was  able to answer simple questions.  His initial blood sugar was 138.  His blood pressure was 99 and his heart rate was in the 90s.  His sats on nonrebreather were 100%.  He was able to transition to nasal cannula and given some nausea medicine as he was retching.  Currently he is able to answer questions now and states nothing like this is ever happened before.  He is currently not complaining of pain anywhere.   [MB]  38 Ddx - syncope, seizure, sepsis, metabolic derangement.   BP improved to 100s after fluids.    [MB]  1611 Prior hemoglobins and they are generally in the 9-10 range.   [MB]  1884 Patient was incontinent of stool and urine but did not show any signs of tongue biting.  His labs are back with   [MB]  1706 Reevaluated patient.  He remains awake alert and in good spirits.  He does have elevated INR and his ammonia is slightly elevated but his mental status is intact.  His troponins equivocal at 0.03.  States his baseline blood pressures are 120s and currently he is in the 90s and 100s here.  I feel the patient should be admitted for further work-up of this syncope/possible seizure and his hypotension in the setting of worsening renal failure.   [MB]  1728 Discussed  with Dr. Bjorn Pippin medicine hospitalist at [facility].  He asked if we could draw blood cultures and get a urinalysis chest x-ray and CT abdomen pelvis before he accepts the patient as he is trying to figure out whether he would be appropriate for floor stepdown.  These tests have been ordered.   [MB]  1660 Dr. Eddie Dibbles recommending ceftriaxone after cultures drawn.   [MB]  2010 Discussed with Dr. Maudie Mercury hospitalist.  He is gone up with the patient in for a stepdown but is still concerned with this hypotension that keeps recurring.  I ordered him another fluid bolus but if he is not responding to that he may need to go to the ICU.   [MB]  2105 Patient's blood pressure back over 100 after the third liter.  He remains quite awake and  alert and making jokes.  Transport team is here and they are comfortable with proceeding with transport.   [MB]    Clinical Course User Index [MB] Hayden Rasmussen, MD    Final Clinical Impressions(s) / ED Diagnoses   Final diagnoses:  Respiratory failure ([place])  Hematoma of left hip, initial encounter  Hypotension due to hypovolemia  Syncope, unspecified syncope type  Hepatic cirrhosis, unspecified hepatic cirrhosis type, unspecified whether ascites present [facility])    ED Discharge Orders    None       Hayden Rasmussen, MD 11/23/17 [phone]

## 2017-11-21 NOTE — ED Notes (Signed)
Report given to [facility]

## 2017-11-21 NOTE — H&P (Signed)
TRH H&P   Patient Demographics:    Logan Patrick, is a 67 y.o. male  MRN: [id number]   DOB - January 04, 1951  Admit Date - 11/21/2017  Outpatient Primary MD for the patient is Logan Sacramento, MD  Referring MD/NP/PA:   Aletta Patrick  Outpatient Specialists:    Logan Patrick  Patient coming from:   work  Chief Complaint  Patient presents with  . Fall      HPI:    Logan Patrick  is a 67 y.o. male, w Etoh cirrhosis , hypertension who presents with c/o bending over at work and feeling light headed like he was going to have syncope and lost his  Balance.    Pt was brought into ED by ex- wife  In ED, pt apparently had episode of syncope, ? Seizure while in triage. He was incontinent of urine but denies biting tongue and there was not witnessed tonic clonic seizure.    Pt was noted to be hypotensive and given 2L in ED with improvement.    CT brain IMPRESSION: 1.  No acute intracranial abnormality.  CT abd/ pelvis IMPRESSION: Mild fluid/ascites along the pancreatic tail, nonspecific. Mild acute pancreatitis is not excluded. However, this may also be related to the patient's abdominopelvic ascites.  Cirrhosis. Portal vein is diminutive but patent. Dominant splenorenal shunt. Small volume abdominopelvic ascites.  Cholelithiasis, without associated inflammatory changes.  Trace left pleural effusion.  CXR IMPRESSION: 1. Probable tiny left effusion 2. Minimal vascular congestion. Mild diffuse increased interstitial opacity compared to prior, possible mild edema.  Wbc 11.5, Hgb 10.3, Plt 132 Na 136, K 3.2 Bun 10, Creatinine 0.99,  Ast 81, Alt 35, Alk phos 143, T. bil 10.8 Ammonia 75  Lactic acid 3.23 Lipase 31 INR 2.75 Trop 0.03  Urinalysis wbc 6-1, rbc 11-20  Pt will be admitted for syncope ? Seizure,  And hypotension and elevated troponin, and UTI, elevation in  ammonia level.     Review of systems:    In addition to the HPI above,    No Fever-chills, No Headache, No changes with Vision or hearing, No problems swallowing food or Liquids, No Chest pain, Cough or Shortness of Breath, No Abdominal pain, No Nausea or Vommitting, Bowel movements are regular, No Blood in stool or Urine, No dysuria, No new skin rashes or bruises, No new joints pains-aches,  No new weakness, tingling, numbness in any extremity, No recent weight gain or loss, No polyuria, polydypsia or polyphagia, No significant Mental Stressors.  A full 10 point Review of Systems was done, except as stated above, all other Review of Systems were negative.   With Past History of the following :    Past Medical History:  Diagnosis Date  . Alcoholic cirrhosis of liver ([place])   . Hypertension   . Liver failure ([place])   . Pneumonia  Past Surgical History:  Procedure Laterality Date  . UMBILICAL HERNIA REPAIR        Social History:     Social History   Tobacco Use  . Smoking status: Former Smoker    Last attempt to quit: 11/30/1976    Years since quitting: 41.0  . Smokeless tobacco: Never Used  Substance Use Topics  . Alcohol use: No     Lives - at home  Mobility - walks by self   Family History :     Family History  Problem Relation Age of Onset  . Diabetes type II Mother       Home Medications:   Prior to Admission medications   Medication Sig Start Date End Date Taking? Authorizing Provider  ibuprofen (ADVIL,MOTRIN) 200 MG tablet Take 200 mg by mouth every 6 (six) hours as needed.   Yes [provider]  albuterol-ipratropium (COMBIVENT) 18-103 MCG/ACT inhaler Inhale 2 puffs into the lungs every 6 (six) hours as needed for wheezing. For wheezing    [provider]  B Complex Vitamins (B COMPLEX-B12 PO) Take 1 tablet by mouth daily.    [provider]  clotrimazole (LOTRIMIN) 1 % cream Apply 1 application topically 2  (two) times daily. Apply to facial rash-avoid eyes    [provider]  Fluocinolone Acetonide 0.01 % OIL Place 5 drops into the left ear 2 (two) times daily.    [provider]  magnesium oxide (MAG-OX) 400 MG tablet Take 400 mg by mouth See admin instructions. Take 1 tab 2 times daily x5 days then 1 tab daily thereafter    [provider]  pantoprazole (PROTONIX) 40 MG tablet Take 40 mg by mouth 2 (two) times daily.    [provider]  rifaximin (XIFAXAN) 550 MG TABS Take 550 mg by mouth 2 (two) times daily.    [provider]     Allergies:    No Known Allergies   Physical Exam:   Vitals  Blood pressure 128/75, pulse 95, temperature 97.7 F (36.5 C), temperature source Oral, resp. rate 16, height 5' 10" (1.778 m), weight 123.5 kg (272 lb 4.3 oz), SpO2 100 %.   1. General   lying in bed in NAD,   2. Normal affect and insight, Not Suicidal or Homicidal, Awake Alert, Oriented X 3.  3. No F.N deficits, ALL C.Nerves Intact, Strength 5/5 all 4 extremities, Sensation intact all 4 extremities, Plantars down going.  4. Ears and Eyes appear Normal, Conjunctivae clear, PERRLA. Moist Oral Mucosa.  5. Supple Neck, No JVD, No cervical lymphadenopathy appriciated, No Carotid Bruits.  6. Symmetrical Chest wall movement, Good air movement bilaterally, CTAB.  7. RRR, No Gallops, Rubs or Murmurs, No Parasternal Heave.  8. Positive Bowel Sounds, Abdomen Soft, No tenderness, No organomegaly appriciated,No rebound -guarding or rigidity.  9.  No Cyanosis, Normal Skin Turgor, large bruise 9 cm oval on the left anterior / lateral thigh,  Slight hematoma  10. Good muscle tone,  joints appear normal , no effusions, Normal ROM.  11. No Palpable Lymph Nodes in Neck or Axillae     Data Review:    CBC Recent Labs  Lab 11/21/17 1530  WBC 11.5*  HGB 10.3*  HCT 30.3*  PLT 132*  MCV 105.6*  MCH 35.9*  MCHC 34.0  RDW 17.4*  LYMPHSABS 1.6  MONOABS  0.9  EOSABS 0.1  BASOSABS 0.1   ------------------------------------------------------------------------------------------------------------------  Chemistries  Recent Labs  Lab 11/21/17 1530  NA  136  K 3.2*  CL 107  CO2 18*  GLUCOSE 150*  BUN 10  CREATININE 0.99  CALCIUM 8.4*  AST 81*  ALT 35  ALKPHOS 143*  BILITOT 10.8*   ------------------------------------------------------------------------------------------------------------------ estimated creatinine clearance is 96.8 mL/min (by C-G formula based on SCr of 0.99 mg/dL). ------------------------------------------------------------------------------------------------------------------ No results for input(s): TSH, T4TOTAL, T3FREE, THYROIDAB in the last 72 hours.  Invalid input(s): FREET3  Coagulation profile Recent Labs  Lab 11/21/17 1530  INR 2.75   ------------------------------------------------------------------------------------------------------------------- No results for input(s): DDIMER in the last 72 hours. -------------------------------------------------------------------------------------------------------------------  Cardiac Enzymes Recent Labs  Lab 11/21/17 1532  TROPONINI 0.03*   ------------------------------------------------------------------------------------------------------------------ No results found for: BNP   ---------------------------------------------------------------------------------------------------------------  Urinalysis    Component Value Date/Time   COLORURINE AMBER (A) 11/21/2017 1948   APPEARANCEUR CLOUDY (A) 11/21/2017 1948   LABSPEC 1.025 11/21/2017 1948   PHURINE 5.5 11/21/2017 1948   GLUCOSEU 100 (A) 11/21/2017 1948   HGBUR MODERATE (A) 11/21/2017 1948   BILIRUBINUR LARGE (A) 11/21/2017 1948   KETONESUR 15 (A) 11/21/2017 1948   PROTEINUR 30 (A) 11/21/2017 1948   UROBILINOGEN 1.0 11/30/2012 2041   NITRITE POSITIVE (A) 11/21/2017 1948   LEUKOCYTESUR  NEGATIVE 11/21/2017 1948    ----------------------------------------------------------------------------------------------------------------   Imaging Results:    Dg Chest 1 View  Result Date: 11/21/2017 CLINICAL DATA:  Near syncope with jaundice EXAM: CHEST  1 VIEW COMPARISON:  10/11/2017 FINDINGS: Possible trace left effusion. No focal consolidation. Stable cardiomediastinal silhouette with mild vascular congestion. Mild diffuse interstitial opacity compared to prior. No pneumothorax. IMPRESSION: 1. Probable tiny left effusion 2. Minimal vascular congestion. Mild diffuse increased interstitial opacity compared to prior, possible mild edema. Electronically Signed   By: Donavan Foil M.D.   On: 11/21/2017 18:38   Ct Head Wo Contrast  Result Date: 11/21/2017 CLINICAL DATA:  Altered mental status and seizure. EXAM: CT HEAD WITHOUT CONTRAST TECHNIQUE: Contiguous axial images were obtained from the base of the skull through the vertex without intravenous contrast. COMPARISON:  CT head dated Dec 11, 2012. FINDINGS: Brain: No evidence of acute infarction, hemorrhage, hydrocephalus, extra-axial collection or mass lesion/mass effect. Stable cerebral atrophy and chronic microvascular ischemic changes. Vascular: No hyperdense vessel or unexpected calcification. Skull: Normal. Negative for fracture or focal lesion. Sinuses/Orbits: No acute finding. Other: None. IMPRESSION: 1.  No acute intracranial abnormality. Electronically Signed   By: Titus Dubin M.D.   On: 11/21/2017 16:02   Ct Abdomen Pelvis W Contrast  Result Date: 11/21/2017 CLINICAL DATA:  Near syncope, jaundice, fall. History of liver failure. EXAM: CT ABDOMEN AND PELVIS WITH CONTRAST TECHNIQUE: Multidetector CT imaging of the abdomen and pelvis was performed using the standard protocol following bolus administration of intravenous contrast. CONTRAST:  183m ISOVUE-300 IOPAMIDOL (ISOVUE-300) INJECTION 61% COMPARISON:  Right upper quadrant  ultrasound dated 09/30/2017 FINDINGS: Lower chest: Trace left pleural effusion. Hepatobiliary: Cirrhosis.  No focal hepatic lesion is seen. Cholelithiasis, without associated inflammatory changes. No intrahepatic or extrahepatic ductal dilatation. Pancreas: Mild fluid/ascites along the pancreatic tail (series 2/image 30), nonspecific. No pancreatic ductal dilatation or atrophy. Spleen: Spleen is normal in size. Adrenals/Urinary Tract: Adrenal glands are within normal limits. Kidneys are within normal limits. No hydronephrosis. Bladder is underdistended but unremarkable. Stomach/Bowel: Stomach is within normal limits. No evidence of bowel obstruction. Normal appendix (series 2/image 45). Mild sigmoid diverticulosis, without evidence of diverticulitis. Vascular/Lymphatic: No evidence of abdominal aortic aneurysm. Atherosclerotic calcifications of the abdominal aorta and branch vessels. Dominant splenorenal shunt.  Diminutive portal vein, patent. No  suspicious abdominopelvic lymphadenopathy. Reproductive: Prostate is unremarkable. Other: Small volume abdominopelvic ascites, predominantly perihepatic and perisplenic. Fat/fluid in the left inguinal canal. Musculoskeletal: Visualized osseous structures are within normal limits. IMPRESSION: Mild fluid/ascites along the pancreatic tail, nonspecific. Mild acute pancreatitis is not excluded. However, this may also be related to the patient's abdominopelvic ascites. Cirrhosis. Portal vein is diminutive but patent. Dominant splenorenal shunt. Small volume abdominopelvic ascites. Cholelithiasis, without associated inflammatory changes. Trace left pleural effusion. Electronically Signed   By: Julian Hy M.D.   On: 11/21/2017 18:40   Dg Hip Unilat With Pelvis 2-3 Views Left  Result Date: 11/21/2017 CLINICAL DATA:  Pain following fall EXAM: DG HIP (WITH OR WITHOUT PELVIS) 2-3V LEFT COMPARISON:  None. FINDINGS: Frontal pelvis as well as frontal and lateral left hip images  were obtained. No fracture or dislocation. There is slight symmetric narrowing of both hip joints. No erosive change. IMPRESSION: No fracture or dislocation. There is slight symmetric narrowing of both hip joints. Electronically Signed   By: Lowella Grip III M.D.   On: 11/21/2017 14:59    ekg nsr at 100, nl axis, no st-t changes c/w ischemia   Assessment & Plan:    Principal Problem:   Syncope, near Active Problems:   Anemia   Hypokalemia   Hypotension   Tachycardia   Thrombocytopenia (HCC)   Seizures (HCC)   Elevated troponin   Syncope  Tele Trop I q6h x3 MRI brain EEG Carotid ultrasound Cardia echo  Hypotension Tele Trop iq 6h x3 Cortisol level  Troponin elevation, likely demand ischemia Consider cardiology consult in AM if troponin trending upwards  UTI Await urine culture Rocephin 1gm iv qday  Hypokalemia Replete Check cmp in am  Anemia/ Thrombocytopenia Check LDH Check cbc in am   DVT Prophylaxis  SCD  AM Labs Ordered, also please review Full Orders  Family Communication: Admission, patients condition and plan of care including tests being ordered have been discussed with the patient  who indicate understanding and agree with the plan and Code Status.  Code Status FULL CODE  Likely DC to  home  Condition GUARDED    Consults called:  None, consider cardiology consult if troponin trending upwards  Admission status: inpatient  Time spent in minutes : 45   Jani Gravel M.D on 11/21/2017 at 10:38 PM  Between 7am to 7pm - Pager - [phone]. After 7pm go to [email or web address] - password [facility]  [facility] - Office  [phone]

## 2017-11-21 NOTE — ED Notes (Signed)
Patient transported to CT with [facility]

## 2017-11-21 NOTE — ED Notes (Signed)
Md informed of decrease in BP , verbal order for ns 1 liter

## 2017-11-21 NOTE — ED Notes (Signed)
Seizure like activity noted in waiting room by staff, pt taking directly to [facility] 3

## 2017-11-22 ENCOUNTER — Other Ambulatory Visit (HOSPITAL_COMMUNITY): Payer: Self-pay

## 2017-11-22 ENCOUNTER — Encounter (HOSPITAL_COMMUNITY): Payer: Self-pay | Admitting: *Deleted

## 2017-11-22 ENCOUNTER — Inpatient Hospital Stay (HOSPITAL_COMMUNITY): Payer: PPO

## 2017-11-22 DIAGNOSIS — N179 Acute kidney failure, unspecified: Secondary | ICD-10-CM | POA: Diagnosis present

## 2017-11-22 DIAGNOSIS — S7012XA Contusion of left thigh, initial encounter: Principal | ICD-10-CM

## 2017-11-22 DIAGNOSIS — R578 Other shock: Secondary | ICD-10-CM

## 2017-11-22 LAB — PREPARE RBC (CROSSMATCH)

## 2017-11-22 LAB — APTT: APTT: 50 s — AB (ref 24–36)

## 2017-11-22 LAB — CORTISOL: Cortisol, Plasma: 12 ug/dL

## 2017-11-22 LAB — CBC
HCT: 22.4 % — ABNORMAL LOW (ref 39.0–52.0)
HEMATOCRIT: 20.8 % — AB (ref 39.0–52.0)
HEMATOCRIT: 21.1 % — AB (ref 39.0–52.0)
HEMOGLOBIN: 7.1 g/dL — AB (ref 13.0–17.0)
Hemoglobin: 7.4 g/dL — ABNORMAL LOW (ref 13.0–17.0)
Hemoglobin: 7 g/dL — ABNORMAL LOW (ref 13.0–17.0)
MCH: 34.9 pg — ABNORMAL HIGH (ref 26.0–34.0)
MCH: 35.5 pg — ABNORMAL HIGH (ref 26.0–34.0)
MCH: 33.3 pg (ref 26.0–34.0)
MCHC: 33 g/dL (ref 30.0–36.0)
MCHC: 33.7 g/dL (ref 30.0–36.0)
MCHC: 33.6 g/dL (ref 30.0–36.0)
MCV: 105.7 fL — ABNORMAL HIGH (ref 78.0–100.0)
MCV: 105.6 fL — ABNORMAL HIGH (ref 78.0–100.0)
MCV: 99.1 fL (ref 78.0–100.0)
PLATELETS: 126 10*3/uL — AB (ref 150–400)
Platelets: 132 10*3/uL — ABNORMAL LOW (ref 150–400)
Platelets: 88 10*3/uL — ABNORMAL LOW (ref 150–400)
RBC: 2.12 MIL/uL — ABNORMAL LOW (ref 4.22–5.81)
RBC: 1.97 MIL/uL — ABNORMAL LOW (ref 4.22–5.81)
RBC: 2.13 MIL/uL — ABNORMAL LOW (ref 4.22–5.81)
RDW: 17.4 % — AB (ref 11.5–15.5)
RDW: 17.7 % — AB (ref 11.5–15.5)
RDW: 20.1 % — AB (ref 11.5–15.5)
WBC: 16.5 10*3/uL — AB (ref 4.0–10.5)
WBC: 16.1 10*3/uL — ABNORMAL HIGH (ref 4.0–10.5)
WBC: 15.1 10*3/uL — ABNORMAL HIGH (ref 4.0–10.5)

## 2017-11-22 LAB — FIBRINOGEN: Fibrinogen: 86 mg/dL — CL (ref 210–475)

## 2017-11-22 LAB — AMMONIA: Ammonia: 40 umol/L — ABNORMAL HIGH (ref 9–35)

## 2017-11-22 LAB — COMPREHENSIVE METABOLIC PANEL
ALT: 31 U/L (ref 17–63)
AST: 61 U/L — AB (ref 15–41)
Albumin: 1.8 g/dL — ABNORMAL LOW (ref 3.5–5.0)
Alkaline Phosphatase: 109 U/L (ref 38–126)
Anion gap: 9 (ref 5–15)
BUN: 12 mg/dL (ref 6–20)
CALCIUM: 8.1 mg/dL — AB (ref 8.9–10.3)
CO2: 20 mmol/L — ABNORMAL LOW (ref 22–32)
Chloride: 108 mmol/L (ref 101–111)
Creatinine, Ser: 1.56 mg/dL — ABNORMAL HIGH (ref 0.61–1.24)
GFR calc Af Amer: 52 mL/min — ABNORMAL LOW (ref >60)
GFR, EST NON AFRICAN AMERICAN: 45 mL/min — AB (ref >60)
Glucose, Bld: 151 mg/dL — ABNORMAL HIGH (ref 65–99)
Potassium: 3.9 mmol/L (ref 3.5–5.1)
Sodium: 137 mmol/L (ref 135–145)
Total Bilirubin: 9.6 mg/dL — ABNORMAL HIGH (ref 0.3–1.2)
Total Protein: 4.9 g/dL — ABNORMAL LOW (ref 6.5–8.1)

## 2017-11-22 LAB — CK TOTAL AND CKMB (NOT AT ARMC)
CK, MB: 5.7 ng/mL — ABNORMAL HIGH (ref 0.5–5.0)
Relative Index: INVALID (ref 0.0–2.5)
Total CK: 90 U/L (ref 49–397)

## 2017-11-22 LAB — TSH: TSH: 10.46 u[IU]/mL — AB (ref 0.350–4.500)

## 2017-11-22 LAB — PROTIME-INR
INR: 2.1
Prothrombin Time: 23.4 seconds — ABNORMAL HIGH (ref 11.4–15.2)

## 2017-11-22 LAB — MRSA PCR SCREENING: MRSA BY PCR: NEGATIVE

## 2017-11-22 LAB — TROPONIN I

## 2017-11-22 LAB — ABO/RH: ABO/RH(D): A POS

## 2017-11-22 LAB — MAGNESIUM: Magnesium: 1.2 mg/dL — ABNORMAL LOW (ref 1.7–2.4)

## 2017-11-22 MED ORDER — MAGNESIUM SULFATE 2 GM/50ML IV SOLN
2.0000 g | Freq: Once | INTRAVENOUS | Status: AC
  Administered 2017-11-22: 2 g via INTRAVENOUS
  Filled 2017-11-22: qty 50

## 2017-11-22 MED ORDER — MORPHINE SULFATE (PF) 2 MG/ML IV SOLN
INTRAVENOUS | Status: AC
  Filled 2017-11-22: qty 1

## 2017-11-22 MED ORDER — SODIUM CHLORIDE 0.9 % IV SOLN
Freq: Once | INTRAVENOUS | Status: AC
Start: 2017-11-22 — End: 2017-11-22
  Administered 2017-11-22: 14:00:00 via INTRAVENOUS

## 2017-11-22 MED ORDER — MAGNESIUM SULFATE 2 GM/50ML IV SOLN: 2.0000 g | Freq: Once | INTRAVENOUS | Status: DC

## 2017-11-22 MED ORDER — FUROSEMIDE 10 MG/ML IJ SOLN
10.0000 mg | INTRAMUSCULAR | Status: AC
  Filled 2017-11-22: qty 2

## 2017-11-22 MED ORDER — MORPHINE SULFATE (PF) 2 MG/ML IV SOLN
1.0000 mg | INTRAVENOUS | Status: DC | PRN
  Administered 2017-11-22 – 2017-11-25 (×9): 1 mg via INTRAVENOUS
  Filled 2017-11-22 (×9): qty 1

## 2017-11-22 MED ORDER — DIPHENHYDRAMINE HCL 50 MG/ML IJ SOLN
12.5000 mg | Freq: Four times a day (QID) | INTRAMUSCULAR | Status: DC | PRN
  Administered 2017-11-23 (×3): 12.5 mg via INTRAVENOUS
  Filled 2017-11-22 (×3): qty 1

## 2017-11-22 MED ORDER — DIPHENHYDRAMINE HCL 50 MG/ML IJ SOLN: 25.0000 mg | Freq: Four times a day (QID) | INTRAMUSCULAR | Status: DC | PRN

## 2017-11-22 MED ORDER — ONDANSETRON HCL 4 MG/2ML IJ SOLN
4.0000 mg | Freq: Four times a day (QID) | INTRAMUSCULAR | Status: DC | PRN
Start: 2017-11-22 — End: 2017-12-06
  Administered 2017-11-22 – 2017-12-04 (×5): 4 mg via INTRAVENOUS
  Filled 2017-11-22 (×5): qty 2

## 2017-11-22 MED ORDER — SODIUM CHLORIDE 0.9 % IV BOLUS: 500.0000 mL | INTRAVENOUS | Status: DC | PRN

## 2017-11-22 MED ORDER — VITAMIN K1 10 MG/ML IJ SOLN
10.0000 mg | Freq: Once | INTRAVENOUS | Status: AC
  Administered 2017-11-22: 10 mg via INTRAVENOUS
  Filled 2017-11-22: qty 1

## 2017-11-22 MED ORDER — SODIUM CHLORIDE 0.9 % IV SOLN
Freq: Once | INTRAVENOUS | Status: AC
  Administered 2017-11-22: 15:00:00 via INTRAVENOUS

## 2017-11-22 MED ORDER — MORPHINE SULFATE (PF) 2 MG/ML IV SOLN
2.0000 mg | INTRAVENOUS | Status: DC | PRN
  Administered 2017-11-22: 2 mg via INTRAVENOUS

## 2017-11-22 MED ORDER — SODIUM CHLORIDE 0.9 % IV SOLN
Freq: Once | INTRAVENOUS | Status: AC
  Administered 2017-11-22: 17:00:00 via INTRAVENOUS

## 2017-11-22 MED ORDER — FUROSEMIDE 10 MG/ML IJ SOLN: 10.0000 mg | INTRAMUSCULAR | Status: DC

## 2017-11-22 MED ORDER — PROMETHAZINE HCL 25 MG/ML IJ SOLN: 12.5000 mg | Freq: Three times a day (TID) | INTRAMUSCULAR | Status: DC | PRN

## 2017-11-22 MED ORDER — TRANEXAMIC ACID 1000 MG/10ML IV SOLN
1000.0000 mg | Freq: Once | INTRAVENOUS | Status: AC
  Administered 2017-11-22: 1000 mg via INTRAVENOUS
  Filled 2017-11-22: qty 10

## 2017-11-22 MED ORDER — SODIUM CHLORIDE 0.9 % IV BOLUS
500.0000 mL | Freq: Once | INTRAVENOUS | Status: AC
  Administered 2017-11-22: 500 mL via INTRAVENOUS

## 2017-11-22 NOTE — Consult Note (Signed)
PULMONARY / CRITICAL CARE MEDICINE   Name: Logan Patrick MRN: [id number] DOB: 28-Jun-1951    ADMISSION DATE:  11/21/2017 CONSULTATION DATE:  4/30  REFERRING MD:  Dr. 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  4/28 while at work he tripped and fell to the ground where he laid for about 20 minutes before being found.  Initially he was able to ambulate without  much pain, but the following day, pain worsened and his L hip became discolored causing him to present to the emergency department. He suffered a syncopal event versus seizure in ED, but resolved and felt better with IVF resuscitation.   He was admitted to the hospitalist service for syncope and hypotension.  4/30 he developed worsening hypotension. HGB checked and was acutely decreased. He was given vit K, FFP, and TXA in preparation for blood product administration. PCCM consulted for ICU transfer.   PAST MEDICAL HISTORY :  He  has a past medical history of Alcoholic cirrhosis of liver ([place]), Hypertension, Liver failure ([place]), and Pneumonia.  PAST SURGICAL HISTORY: He  has a past surgical history that includes Umbilical hernia repair.  No Known Allergies  No current facility-administered medications on file prior to encounter.    Current Outpatient Medications on File Prior to Encounter  Medication Sig  . ibuprofen (ADVIL,MOTRIN) 200 MG tablet Take 200 mg by mouth every 6 (six) hours as needed.  Marland Kitchen albuterol-ipratropium (COMBIVENT) 18-103 MCG/ACT inhaler Inhale 2 puffs into the lungs every 6 (six) hours as needed for wheezing. For wheezing  . B Complex Vitamins (B COMPLEX-B12 PO) Take 1 tablet by mouth daily.  . clotrimazole (LOTRIMIN) 1 % cream Apply 1 application topically 2 (two) times daily. Apply to facial rash-avoid eyes  .  Fluocinolone Acetonide 0.01 % OIL Place 5 drops into the left ear 2 (two) times daily.  . magnesium oxide (MAG-OX) 400 MG tablet Take 400 mg by mouth See admin instructions. Take 1 tab 2 times daily x5 days then 1 tab daily thereafter  . pantoprazole (PROTONIX) 40 MG tablet Take 40 mg by mouth 2 (two) times daily.  . rifaximin (XIFAXAN) 550 MG TABS Take 550 mg by mouth 2 (two) times daily.    FAMILY HISTORY:  His indicated that the status of his mother is unknown.   SOCIAL HISTORY: He  reports that he quit smoking about 41 years ago. He has never used smokeless tobacco. He reports that he does not drink alcohol or use drugs.  REVIEW OF SYSTEMS:   Bolds are positive  Constitutional: weight loss, gain, night sweats, Fevers, chills, fatigue .  HEENT: headaches, Sore throat, sneezing, nasal congestion, post nasal drip, Difficulty swallowing, Tooth/dental problems, visual complaints visual changes, ear ache CV:  chest pain, radiates:,Orthopnea, PND, LLE swelling, dizziness, palpitations, syncope.  GI  heartburn, indigestion, abdominal pain, nausea, vomiting, diarrhea, change in bowel habits, loss of appetite, bloody stools.  Resp: cough, productive: , hemoptysis, dyspnea, chest pain, pleuritic.  Skin: rash or itching or icterus GU: dysuria, change in color of urine, urgency or frequency. flank pain, hematuria  MS: L hip pain or swelling. decreased range of motion  Psych: change in mood or affect. depression or anxiety.  Neuro: difficulty with speech, weakness, numbness, ataxia   SUBJECTIVE:    VITAL SIGNS: BP 121/69   Pulse (!) 112  Temp 97.8 F (36.6 C) (Oral)   Resp 18   Ht 5\' 10"  (1.778 m)   Wt 126 kg (277 lb 12.5 oz)   SpO2 100%   BMI 39.86 kg/m   HEMODYNAMICS:    VENTILATOR SETTINGS:    INTAKE / OUTPUT: No intake/output data recorded.  PHYSICAL EXAMINATION: General:  Obese male in mild distress r/t pain.  Neuro:  Alert, oriented, non-focal HEENT:  [place]/AT, PERRL,  jaundiced sclera.  Cardiovascular:  RRR, no MRG Lungs:  Clear, bilateral breath sounds Abdomen:  Soft, distended, fluctuant Musculoskeletal:  Edema to L hip and LLE. Skin:  Ecchymosis L hip.   LABS:  BMET Recent Labs  Lab 11/21/17 1530 11/22/17 1128  NA 136 137  K 3.2* 3.9  CL 107 108  CO2 18* 20*  BUN 10 12  CREATININE 0.99 1.56*  GLUCOSE 150* 151*    Electrolytes Recent Labs  Lab 11/21/17 1530 11/22/17 1128  CALCIUM 8.4* 8.1*  MG  --  1.2*    CBC Recent Labs  Lab 11/21/17 1530 11/22/17 1128 11/22/17 1333  WBC 11.5* 16.5* 16.1*  HGB 10.3* 7.4* 7.0*  HCT 30.3* 22.4* 20.8*  PLT 132* 126* 132*    Coag's Recent Labs  Lab 11/21/17 1530 11/22/17 1333  APTT  --  50*  INR 2.75  --     Sepsis Markers Recent Labs  Lab 11/21/17 1845  LATICACIDVEN 3.23*    ABG No results for input(s): PHART, PCO2ART, PO2ART in the last 168 hours.  Liver Enzymes Recent Labs  Lab 11/21/17 1530 11/22/17 1128  AST 81* 61*  ALT 35 31  ALKPHOS 143* 109  BILITOT 10.8* 9.6*  ALBUMIN 2.2* 1.8*    Cardiac Enzymes Recent Labs  Lab 11/21/17 1532 11/21/17 2333  TROPONINI 0.03* <0.03    Glucose Recent Labs  Lab 11/21/17 1529  GLUCAP 138*    Imaging Dg Chest 1 View  Result Date: 11/21/2017 CLINICAL DATA:  Near syncope with jaundice EXAM: CHEST  1 VIEW COMPARISON:  10/11/2017 FINDINGS: Possible trace left effusion. No focal consolidation. Stable cardiomediastinal silhouette with mild vascular congestion. Mild diffuse interstitial opacity compared to prior. No pneumothorax. IMPRESSION: 1. Probable tiny left effusion 2. Minimal vascular congestion. Mild diffuse increased interstitial opacity compared to prior, possible mild edema. Electronically Signed   By: Donavan Foil M.D.   On: 11/21/2017 18:38   Ct Head Wo Contrast  Result Date: 11/21/2017 CLINICAL DATA:  Altered mental status and seizure. EXAM: CT HEAD WITHOUT CONTRAST TECHNIQUE: Contiguous axial images were  obtained from the base of the skull through the vertex without intravenous contrast. COMPARISON:  CT head dated Dec 11, 2012. FINDINGS: Brain: No evidence of acute infarction, hemorrhage, hydrocephalus, extra-axial collection or mass lesion/mass effect. Stable cerebral atrophy and chronic microvascular ischemic changes. Vascular: No hyperdense vessel or unexpected calcification. Skull: Normal. Negative for fracture or focal lesion. Sinuses/Orbits: No acute finding. Other: None. IMPRESSION: 1.  No acute intracranial abnormality. Electronically Signed   By: Titus Dubin M.D.   On: 11/21/2017 16:02   Mr Brain Wo Contrast  Result Date: 11/22/2017 CLINICAL DATA:  Altered level of consciousness. Liver failure cirrhosis EXAM: MRI HEAD WITHOUT CONTRAST TECHNIQUE: Multiplanar, multiecho pulse sequences of the brain and surrounding structures were obtained without intravenous contrast. COMPARISON:  CT head 11/21/2017 FINDINGS: Brain: Moderate atrophy. Ventricular enlargement consistent with atrophy. Negative for acute infarct. Minimal chronic changes in the white matter. Negative for hemorrhage, mass, or fluid collection Vascular: Normal arterial flow voids Skull  and upper cervical spine: Negative Sinuses/Orbits: Negative Other: None IMPRESSION: Moderate atrophy.  No acute intracranial abnormality. Electronically Signed   By: Franchot Gallo M.D.   On: 11/22/2017 10:39   Ct Abdomen Pelvis W Contrast  Result Date: 11/21/2017 CLINICAL DATA:  Near syncope, jaundice, fall. History of liver failure. EXAM: CT ABDOMEN AND PELVIS WITH CONTRAST TECHNIQUE: Multidetector CT imaging of the abdomen and pelvis was performed using the standard protocol following bolus administration of intravenous contrast. CONTRAST:  155mL ISOVUE-300 IOPAMIDOL (ISOVUE-300) INJECTION 61% COMPARISON:  Right upper quadrant ultrasound dated 09/30/2017 FINDINGS: Lower chest: Trace left pleural effusion. Hepatobiliary: Cirrhosis.  No focal hepatic  lesion is seen. Cholelithiasis, without associated inflammatory changes. No intrahepatic or extrahepatic ductal dilatation. Pancreas: Mild fluid/ascites along the pancreatic tail (series 2/image 30), nonspecific. No pancreatic ductal dilatation or atrophy. Spleen: Spleen is normal in size. Adrenals/Urinary Tract: Adrenal glands are within normal limits. Kidneys are within normal limits. No hydronephrosis. Bladder is underdistended but unremarkable. Stomach/Bowel: Stomach is within normal limits. No evidence of bowel obstruction. Normal appendix (series 2/image 45). Mild sigmoid diverticulosis, without evidence of diverticulitis. Vascular/Lymphatic: No evidence of abdominal aortic aneurysm. Atherosclerotic calcifications of the abdominal aorta and branch vessels. Dominant splenorenal shunt.  Diminutive portal vein, patent. No suspicious abdominopelvic lymphadenopathy. Reproductive: Prostate is unremarkable. Other: Small volume abdominopelvic ascites, predominantly perihepatic and perisplenic. Fat/fluid in the left inguinal canal. Musculoskeletal: Visualized osseous structures are within normal limits. IMPRESSION: Mild fluid/ascites along the pancreatic tail, nonspecific. Mild acute pancreatitis is not excluded. However, this may also be related to the patient's abdominopelvic ascites. Cirrhosis. Portal vein is diminutive but patent. Dominant splenorenal shunt. Small volume abdominopelvic ascites. Cholelithiasis, without associated inflammatory changes. Trace left pleural effusion. Electronically Signed   By: Julian Hy M.D.   On: 11/21/2017 18:40    STUDIES:  CT head 4/29 > No acute intracranial abnormality. CT abdomen/pelvis 4/29 > Mild fluid/ascites along the pancreatic tail, nonspecific. Mild acute pancreatitis is not excluded. However, this may also be related to the patient's abdominopelvic ascites. Cirrhosis. Portal vein is diminutive but patent. Dominant splenorenal shunt. Small volume  abdominopelvic ascites. Cholelithiasis, without associated inflammatory changes.Trace left pleural effusion. DG hip L 4/30 > No fracture or dislocation. There is slight symmetric narrowing of both hip joints.  CULTURES:  ANTIBIOTICS: Ceftriaxone 4/29 >  SIGNIFICANT EVENTS: 4/29 admit 4/30 transfer to ICU  LINES/TUBES:   DISCUSSION: 67 year old male with alcoholic cirrhosis. He fell at work and presented to ED with syncope. Improved with volume. No hip fracture, but large L hip hematoma. Complicated by shock. Now transferring to ICU for transfusion.   ASSESSMENT / PLAN:  Shock: Secondary to Left thigh hematoma r/t fall. He is coagulopathic and thrombocytopenic in the setting of hepatic failure/cirrhosis. Distal pulses intact.  - Vitamin K and TXA given by primary - 2 units PRBC now. 2 units FFP now - Serial CBC - No further IVF resuscitation - Transfer to ICU for close monitoring - Ortho following, concerned this hematoma with turn into large necrotic wound. - Morphine for pain management  Hepatic failure in the setting of alcoholic cirrhosis: Does not seems to be encephalopathic. Ammonia 61. - Trend LFT - Consider lactulose if MS changes - CTX for SBP ppx  Acute renal failure - likely due to shock, renal function normal on admit, now worse.  - Correcting shock - Follow BMP  Hypomagnesemia - Replete, follow  Seizure vs syncope in ED. MRI brain non-acute. Suspect this is syncope due to  anemia.  - EEG pending.  - Carotid doppler pending.  - No AEDS  Georgann Housekeeper, AGACNP-BC [facility] [place] Pulmonology/Critical Care Pager [phone] or [phone]  11/22/2017 3:46 PM

## 2017-11-22 NOTE — Consult Note (Signed)
Reason for Consult:Left thigh hematoma Referring Physician: P Josejulian Tarango is an 67 y.o. male.  HPI: Logan Patrick fell at work on Sunday onto his left side. He suffered a small abrasion and could not get back up on his own. His employees were able to help him up and he was ambulatory the rest of the day. When he got up the next morning his hip/thigh was still hurting and swollen. He took some ibuprofen but it didn't really help and he had his ex-wife bring him to the hospital. It has continued to worsen here. CT scan of the abd/pelvis shows the beginning of a hematoma that appears to be confined to the subq and fat and lies outside the muscle compartments. Labwork shows a moderate coagulopathy, presumably from his hepatic dysfunction. He c/o localized pain in the area as well as continued presyncopal symptoms.  Past Medical History:  Diagnosis Date  . Alcoholic cirrhosis of liver ([place])   . Hypertension   . Liver failure ([place])   . Pneumonia     Past Surgical History:  Procedure Laterality Date  . UMBILICAL HERNIA REPAIR      Family History  Problem Relation Age of Onset  . Diabetes type II Mother     Social History:  reports that he quit smoking about 41 years ago. He has never used smokeless tobacco. He reports that he does not drink alcohol or use drugs.  Allergies: No Known Allergies  Medications: I have reviewed the patient's current medications.  Results for orders placed or performed during the hospital encounter of 11/21/17 (from the past 48 hour(s))  Culture, blood (routine x 2)     Status: None (Preliminary result)   Collection Time: 11/21/17  3:20 PM  Result Value Ref Range   Specimen Description      BLOOD LEFT ANTECUBITAL Performed at [facility], [address] [place], [place] [address]    Special Requests      BOTTLES DRAWN AEROBIC AND ANAEROBIC Blood Culture adequate volume Performed at [facility], [address] [place], [place]  [address]    Culture      NO GROWTH < 12 HOURS Performed at [facility], [place] [address] [place], [place] [address]    Report Status PENDING   POC CBG, ED     Status: Abnormal   Collection Time: 11/21/17  3:29 PM  Result Value Ref Range   Glucose-Capillary 138 (H) 65 - 99 mg/dL  CBC with Differential     Status: Abnormal   Collection Time: 11/21/17  3:30 PM  Result Value Ref Range   WBC 11.5 (H) 4.0 - 10.5 K/uL   RBC 2.87 (L) 4.22 - 5.81 MIL/uL   Hemoglobin 10.3 (L) 13.0 - 17.0 g/dL   HCT 30.3 (L) 39.0 - 52.0 %   MCV 105.6 (H) 78.0 - 100.0 fL   MCH 35.9 (H) 26.0 - 34.0 pg   MCHC 34.0 30.0 - 36.0 g/dL   RDW 17.4 (H) 11.5 - 15.5 %   Platelets 132 (L) 150 - 400 K/uL   Neutrophils Relative % 77 %   Neutro Abs 8.8 (H) 1.7 - 7.7 K/uL   Lymphocytes Relative 14 %   Lymphs Abs 1.6 0.7 - 4.0 K/uL   Monocytes Relative 8 %   Monocytes Absolute 0.9 0.1 - 1.0 K/uL   Eosinophils Relative 1 %   Eosinophils Absolute 0.1 0.0 - 0.7 K/uL   Basophils Relative  0 %   Basophils Absolute 0.1 0.0 - 0.1 K/uL    Comment: Performed at [facility], [address] [place], [place] [address]  Comprehensive metabolic panel     Status: Abnormal   Collection Time: 11/21/17  3:30 PM  Result Value Ref Range   Sodium 136 135 - 145 mmol/L   Potassium 3.2 (L) 3.5 - 5.1 mmol/L   Chloride 107 101 - 111 mmol/L   CO2 18 (L) 22 - 32 mmol/L   Glucose, Bld 150 (H) 65 - 99 mg/dL   BUN 10 6 - 20 mg/dL   Creatinine, Ser 0.99 0.61 - 1.24 mg/dL   Calcium 8.4 (L) 8.9 - 10.3 mg/dL   Total Protein 5.8 (L) 6.5 - 8.1 g/dL   Albumin 2.2 (L) 3.5 - 5.0 g/dL   AST 81 (H) 15 - 41 U/L   ALT 35 17 - 63 U/L   Alkaline Phosphatase 143 (H) 38 - 126 U/L   Total Bilirubin 10.8 (H) 0.3 - 1.2 mg/dL   GFR calc non Af Amer >60 >60 mL/min   GFR calc Af Amer >60 >60 mL/min    Comment: (NOTE) The eGFR has been calculated using the CKD EPI equation. This calculation has not been validated in all clinical  situations. eGFR's persistently <60 mL/min signify possible Chronic Kidney Disease.    Anion gap 11 5 - 15    Comment: Performed at [facility], [address] [place], [place] [address]  Lipase, blood     Status: None   Collection Time: 11/21/17  3:30 PM  Result Value Ref Range   Lipase 31 11 - 51 U/L    Comment: Performed at [facility], [address] [place], [place] [address]  Protime-INR     Status: Abnormal   Collection Time: 11/21/17  3:30 PM  Result Value Ref Range   Prothrombin Time 28.9 (H) 11.4 - 15.2 seconds   INR 2.75     Comment: Performed at [facility], [address] [place], [place] [address]  Troponin I     Status: Abnormal   Collection Time: 11/21/17  3:32 PM  Result Value Ref Range   Troponin I 0.03 (HH) <0.03 ng/mL    Comment: CRITICAL RESULT CALLED TO, READ BACK BY AND VERIFIED WITH: AMY HARTLEY RN _0  11/21/2017 OLSONM Performed at [facility], [address] [place], [place] [address]   Ammonia     Status: Abnormal   Collection Time: 11/21/17  3:32 PM  Result Value Ref Range   Ammonia 75 (H) 9 - 35 umol/L    Comment: Performed at [facility], [address] [place], [place] [address]  Culture, blood (routine x 2)     Status: None (Preliminary result)   Collection Time: 11/21/17  3:40 PM  Result Value Ref Range   Specimen Description      BLOOD LEFT HAND Performed at [facility], [address] [place], [place] [address]    Special Requests      BOTTLES DRAWN AEROBIC AND ANAEROBIC Blood Culture adequate volume Performed at [facility], [address] [place], [place] [address]    Culture      NO GROWTH < 12 HOURS Performed at [facility], [place] [address] [place], [place] [address]    Report Status PENDING   I-Stat CG4 Lactic Acid, ED  Status: Abnormal   Collection Time: 11/21/17  6:45 PM  Result Value Ref Range   Lactic Acid, Venous 3.23 (HH)  0.5 - 1.9 mmol/L   Comment NOTIFIED PHYSICIAN   Urine rapid drug screen (hosp performed)     Status: Abnormal   Collection Time: 11/21/17  7:48 PM  Result Value Ref Range   Opiates POSITIVE (A) NONE DETECTED   Cocaine NONE DETECTED NONE DETECTED   Benzodiazepines NONE DETECTED NONE DETECTED   Amphetamines NONE DETECTED NONE DETECTED   Tetrahydrocannabinol NONE DETECTED NONE DETECTED   Barbiturates NONE DETECTED NONE DETECTED    Comment: (NOTE) DRUG SCREEN FOR MEDICAL PURPOSES ONLY.  IF CONFIRMATION IS NEEDED FOR ANY PURPOSE, NOTIFY LAB WITHIN 5 DAYS. LOWEST DETECTABLE LIMITS FOR URINE DRUG SCREEN Drug Class                     Cutoff (ng/mL) Amphetamine and metabolites    1000 Barbiturate and metabolites    200 Benzodiazepine                 256 Tricyclics and metabolites     300 Opiates and metabolites        300 Cocaine and metabolites        300 THC                            50 Performed at [facility], [address] [place], [place] [address]   Urinalysis, Routine w reflex microscopic     Status: Abnormal   Collection Time: 11/21/17  7:48 PM  Result Value Ref Range   Color, Urine AMBER (A) YELLOW    Comment: BIOCHEMICALS MAY BE AFFECTED BY COLOR   APPearance CLOUDY (A) CLEAR   Specific Gravity, Urine 1.025 1.005 - 1.030   pH 5.5 5.0 - 8.0   Glucose, UA 100 (A) NEGATIVE mg/dL   Hgb urine dipstick MODERATE (A) NEGATIVE   Bilirubin Urine LARGE (A) NEGATIVE   Ketones, ur 15 (A) NEGATIVE mg/dL   Protein, ur 30 (A) NEGATIVE mg/dL   Nitrite POSITIVE (A) NEGATIVE   Leukocytes, UA NEGATIVE NEGATIVE    Comment: Performed at [facility], [address] [place], [place] [address]  Urinalysis, Microscopic (reflex)     Status: Abnormal   Collection Time: 11/21/17  7:48 PM  Result Value Ref Range   RBC / HPF 11-20 0 - 5 RBC/hpf   WBC, UA 6-10 0 - 5 WBC/hpf   Bacteria, UA MANY (A) NONE SEEN   Squamous Epithelial / LPF 0-5 0 - 5   Hyaline Casts, UA  PRESENT    Granular Casts, UA PRESENT    WBC Casts, UA PRESENT    Ca Oxalate Crys, UA PRESENT     Comment: Performed at [facility], [address] [place], [place] [address]  Cortisol     Status: None   Collection Time: 11/21/17 11:33 PM  Result Value Ref Range   Cortisol, Plasma 12.0 ug/dL    Comment: (NOTE) AM    6.7 - 22.6 ug/dL PM   <10.0       ug/dL Performed at [facility] [address] [place], [place] [address]   Troponin I (q 6hr x 3)     Status: None   Collection Time: 11/21/17 11:33 PM  Result Value Ref Range   Troponin I <0.03 <0.03 ng/mL  Comment: Performed at [facility], [place] [address] [place], [place] [address]  TSH     Status: Abnormal   Collection Time: 11/21/17 11:33 PM  Result Value Ref Range   TSH 10.460 (H) 0.350 - 4.500 uIU/mL    Comment: Performed by a 3rd Generation assay with a functional sensitivity of <=0.01 uIU/mL. Performed at [facility], [place] [address], [place], [place] [address]   CK total and CKMB (cardiac)not at [facility]     Status: Abnormal   Collection Time: 11/21/17 11:33 PM  Result Value Ref Range   Total CK 90 49 - 397 U/L   CK, MB 5.7 (H) 0.5 - 5.0 ng/mL   Relative Index RELATIVE INDEX IS INVALID 0.0 - 2.5    Comment: WHEN CK < 100 U/L        Performed at [facility] [address] [place], [place] [address]   Ammonia     Status: Abnormal   Collection Time: 11/22/17 11:28 AM  Result Value Ref Range   Ammonia 40 (H) 9 - 35 umol/L    Comment: Performed at [facility], [place] [address] [place], [place] [address]  Comprehensive metabolic panel     Status: Abnormal   Collection Time: 11/22/17 11:28 AM  Result Value Ref Range   Sodium 137 135 - 145 mmol/L   Potassium 3.9 3.5 - 5.1 mmol/L   Chloride 108 101 - 111 mmol/L   CO2 20 (L) 22 - 32 mmol/L   Glucose, Bld 151 (H) 65 - 99 mg/dL   BUN 12 6 - 20 mg/dL   Creatinine, Ser 1.56 (H) 0.61 - 1.24 mg/dL   Calcium 8.1 (L) 8.9 - 10.3  mg/dL   Total Protein 4.9 (L) 6.5 - 8.1 g/dL   Albumin 1.8 (L) 3.5 - 5.0 g/dL   AST 61 (H) 15 - 41 U/L   ALT 31 17 - 63 U/L   Alkaline Phosphatase 109 38 - 126 U/L   Total Bilirubin 9.6 (H) 0.3 - 1.2 mg/dL   GFR calc non Af Amer 45 (L) >60 mL/min   GFR calc Af Amer 52 (L) >60 mL/min    Comment: (NOTE) The eGFR has been calculated using the CKD EPI equation. This calculation has not been validated in all clinical situations. eGFR's persistently <60 mL/min signify possible Chronic Kidney Disease.    Anion gap 9 5 - 15    Comment: Performed at [facility] [address] [place], [place] [address]  CBC     Status: Abnormal   Collection Time: 11/22/17 11:28 AM  Result Value Ref Range   WBC 16.5 (H) 4.0 - 10.5 K/uL   RBC 2.12 (L) 4.22 - 5.81 MIL/uL   Hemoglobin 7.4 (L) 13.0 - 17.0 g/dL   HCT 22.4 (L) 39.0 - 52.0 %   MCV 105.7 (H) 78.0 - 100.0 fL   MCH 34.9 (H) 26.0 - 34.0 pg   MCHC 33.0 30.0 - 36.0 g/dL   RDW 17.4 (H) 11.5 - 15.5 %   Platelets 126 (L) 150 - 400 K/uL    Comment: Performed at [facility], [place] [address], [place], [place] [address]  Magnesium     Status: Abnormal   Collection Time: 11/22/17 11:28 AM  Result Value Ref Range   Magnesium 1.2 (L) 1.7 - 2.4 mg/dL    Comment: Performed at [facility] [address] [place], [place] [address]  Dg Chest 1 View  Result Date: 11/21/2017 CLINICAL DATA:  Near syncope with jaundice EXAM: CHEST  1 VIEW COMPARISON:  10/11/2017 FINDINGS: Possible trace left effusion. No focal consolidation. Stable cardiomediastinal silhouette with mild vascular congestion. Mild diffuse interstitial opacity compared to prior. No pneumothorax. IMPRESSION: 1. Probable tiny left effusion 2. Minimal vascular congestion. Mild diffuse increased interstitial opacity compared to prior, possible mild edema. Electronically Signed   By: Donavan Foil M.D.   On: 11/21/2017 18:38   Ct Head Wo Contrast  Result Date: 11/21/2017 CLINICAL  DATA:  Altered mental status and seizure. EXAM: CT HEAD WITHOUT CONTRAST TECHNIQUE: Contiguous axial images were obtained from the base of the skull through the vertex without intravenous contrast. COMPARISON:  CT head dated Dec 11, 2012. FINDINGS: Brain: No evidence of acute infarction, hemorrhage, hydrocephalus, extra-axial collection or mass lesion/mass effect. Stable cerebral atrophy and chronic microvascular ischemic changes. Vascular: No hyperdense vessel or unexpected calcification. Skull: Normal. Negative for fracture or focal lesion. Sinuses/Orbits: No acute finding. Other: None. IMPRESSION: 1.  No acute intracranial abnormality. Electronically Signed   By: Titus Dubin M.D.   On: 11/21/2017 16:02   Mr Brain Wo Contrast  Result Date: 11/22/2017 CLINICAL DATA:  Altered level of consciousness. Liver failure cirrhosis EXAM: MRI HEAD WITHOUT CONTRAST TECHNIQUE: Multiplanar, multiecho pulse sequences of the brain and surrounding structures were obtained without intravenous contrast. COMPARISON:  CT head 11/21/2017 FINDINGS: Brain: Moderate atrophy. Ventricular enlargement consistent with atrophy. Negative for acute infarct. Minimal chronic changes in the white matter. Negative for hemorrhage, mass, or fluid collection Vascular: Normal arterial flow voids Skull and upper cervical spine: Negative Sinuses/Orbits: Negative Other: None IMPRESSION: Moderate atrophy.  No acute intracranial abnormality. Electronically Signed   By: Franchot Gallo M.D.   On: 11/22/2017 10:39   Ct Abdomen Pelvis W Contrast  Result Date: 11/21/2017 CLINICAL DATA:  Near syncope, jaundice, fall. History of liver failure. EXAM: CT ABDOMEN AND PELVIS WITH CONTRAST TECHNIQUE: Multidetector CT imaging of the abdomen and pelvis was performed using the standard protocol following bolus administration of intravenous contrast. CONTRAST:  113m ISOVUE-300 IOPAMIDOL (ISOVUE-300) INJECTION 61% COMPARISON:  Right upper quadrant ultrasound  dated 09/30/2017 FINDINGS: Lower chest: Trace left pleural effusion. Hepatobiliary: Cirrhosis.  No focal hepatic lesion is seen. Cholelithiasis, without associated inflammatory changes. No intrahepatic or extrahepatic ductal dilatation. Pancreas: Mild fluid/ascites along the pancreatic tail (series 2/image 30), nonspecific. No pancreatic ductal dilatation or atrophy. Spleen: Spleen is normal in size. Adrenals/Urinary Tract: Adrenal glands are within normal limits. Kidneys are within normal limits. No hydronephrosis. Bladder is underdistended but unremarkable. Stomach/Bowel: Stomach is within normal limits. No evidence of bowel obstruction. Normal appendix (series 2/image 45). Mild sigmoid diverticulosis, without evidence of diverticulitis. Vascular/Lymphatic: No evidence of abdominal aortic aneurysm. Atherosclerotic calcifications of the abdominal aorta and branch vessels. Dominant splenorenal shunt.  Diminutive portal vein, patent. No suspicious abdominopelvic lymphadenopathy. Reproductive: Prostate is unremarkable. Other: Small volume abdominopelvic ascites, predominantly perihepatic and perisplenic. Fat/fluid in the left inguinal canal. Musculoskeletal: Visualized osseous structures are within normal limits. IMPRESSION: Mild fluid/ascites along the pancreatic tail, nonspecific. Mild acute pancreatitis is not excluded. However, this may also be related to the patient's abdominopelvic ascites. Cirrhosis. Portal vein is diminutive but patent. Dominant splenorenal shunt. Small volume abdominopelvic ascites. Cholelithiasis, without associated inflammatory changes. Trace left pleural effusion. Electronically Signed   By: SJulian HyM.D.   On: 11/21/2017 18:40   Dg Hip Unilat With Pelvis 2-3 Views Left  Result Date: 11/21/2017 CLINICAL DATA:  Pain following fall EXAM: DG HIP (WITH OR WITHOUT PELVIS) 2-3V LEFT COMPARISON:  None. FINDINGS: Frontal pelvis as well as frontal and lateral left hip images were  obtained. No fracture or dislocation. There is slight symmetric narrowing of both hip joints. No erosive change. IMPRESSION: No fracture or dislocation. There is slight symmetric narrowing of both hip joints. Electronically Signed   By: Lowella Grip III M.D.   On: 11/21/2017 14:59    Review of Systems  Constitutional: Negative for weight loss.  HENT: Negative for ear discharge, ear pain, hearing loss and tinnitus.   Eyes: Negative for blurred vision, double vision, photophobia and pain.  Respiratory: Negative for cough, sputum production and shortness of breath.   Cardiovascular: Negative for chest pain.  Gastrointestinal: Negative for abdominal pain, nausea and vomiting.  Genitourinary: Negative for dysuria, flank pain, frequency and urgency.  Musculoskeletal: Positive for joint pain (Left thigh). Negative for back pain, falls, myalgias and neck pain.  Neurological: Negative for dizziness, tingling, sensory change, focal weakness, loss of consciousness and headaches.  Endo/Heme/Allergies: Does not bruise/bleed easily.  Psychiatric/Behavioral: Negative for depression, memory loss and substance abuse. The patient is not nervous/anxious.    Blood pressure (!) 103/55, pulse 80, temperature 97.8 F (36.6 C), temperature source Oral, resp. rate 19, height _0  (1.778 m), weight 127.3 kg (280 lb 10.3 oz), SpO2 93 %. Physical Exam  Constitutional: He appears well-developed and well-nourished. No distress.  HENT:  Head: Normocephalic and atraumatic.  Eyes: Conjunctivae are normal. Right eye exhibits no discharge. Left eye exhibits no discharge. No scleral icterus.  Neck: Normal range of motion.  Cardiovascular: Normal rate and regular rhythm.  Respiratory: Effort normal. No respiratory distress.  Musculoskeletal:  LLE Small, healing scabbed abrasion over proximal anterolateral thigh. Large ecchymotic area that is tense but not hard. Thigh swollen dramatically compared with left.  No knee  or ankle effusion  Knee stable to varus/ valgus and anterior/posterior stress  Sens DPN, SPN, TN intact  Motor EHL, ext, flex, evers 5/5  DP 2+, PT 1+, No significant edema  Neurological: He is alert.  Skin: Skin is warm and dry. He is not diaphoretic.  Psychiatric: He has a normal mood and affect. His behavior is normal.    Assessment/Plan: Left thigh hematoma -- Have attempted to compress hematoma with ACE wraps though location makes it somewhat difficult. Will give vitamin K (may be unhelpful but little downside). Checking APTT and fibrinogen, may need cryo/FFP. Will also give dose of TXA. Serial CBC's to look for anemia and thrombocytopenia. Will follow. No WB or activity restrictions at this time. Suspect overlying skin will necrose and he will develop wound, possibly large. May need WOC consult and/or referral to wound care center.    Lisette Abu, PA-C Orthopedic Surgery [phone] 11/22/2017, 1:01 PM

## 2017-11-22 NOTE — Consult Note (Signed)
            [facility] CM Primary Care Navigator  11/22/2017  CARREL LEATHER [birth year] [phone]   Attempt to see patient at the bedside to identify possible discharge needs but he was transferred to cardiac ICU [facility] 07) for close monitoring. Per MD note, patient developed worsening hypotension, hemoglobin checked and was acutely decreased- needing blood product administration.   Will attempt to see patient at another time when out of ICU.   For additional questions please contact:  Edwena Felty A. Kaylene Dawn, BSN, RN-BC [facility] PRIMARY CARE Navigator Cell: [phone]

## 2017-11-22 NOTE — Progress Notes (Signed)
Pt moved to 2H07, hold EEG until 5/1 per RN

## 2017-11-22 NOTE — Progress Notes (Signed)
PT Cancellation Note  Patient Details Name: Logan Patrick MRN: [id number] DOB: [birth year]   Cancelled Treatment:    Reason Eval/Treat Not Completed: Medical issues which prohibited therapy. Pt has developed large lt thigh hematoma and is pending transfer to ICU and vit K and blood transfusion. Will check on tomorrow.   [place] 11/22/2017, 2:26 PM [employer] PT [phone]

## 2017-11-22 NOTE — Progress Notes (Signed)
PT Cancellation Note  Patient Details Name: Logan Patrick MRN: [id number] DOB: [birth year]   Cancelled Treatment:    Reason Eval/Treat Not Completed: Patient at procedure or test/unavailable(Pt in MRI.  Will return as able.  Thanks. )   Godfrey Pick Lenton Gendreau 11/22/2017, 9:41 AM  [facility] [phone] (pager)

## 2017-11-22 NOTE — Consult Note (Signed)
PULMONARY / CRITICAL CARE MEDICINE   Name: Logan Patrick MRN: [id number] DOB: April 21, 1951    ADMISSION DATE:  11/21/2017 CONSULTATION DATE:  4/30  REFERRING MD:  Dr. 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  4/28 while at work he tripped and fell to the ground where he laid for about 20 minutes before being found.  Initially he was able to ambulate without  much pain, but the following day, pain worsened and his L hip became discolored causing him to present to the emergency department. He suffered a syncopal event versus seizure in ED, but resolved and felt better with IVF resuscitation.   He was admitted to the hospitalist service for syncope and hypotension.  4/30 he developed worsening hypotension. HGB checked and was acutely decreased. He was given vit K, FFP, and TXA in preparation for blood product administration. PCCM consulted for ICU transfer.   PAST MEDICAL HISTORY :  He  has a past medical history of Alcoholic cirrhosis of liver ([place]), Hypertension, Liver failure ([place]), and Pneumonia.  PAST SURGICAL HISTORY: He  has a past surgical history that includes Umbilical hernia repair.  No Known Allergies  No current facility-administered medications on file prior to encounter.    Current Outpatient Medications on File Prior to Encounter  Medication Sig  . ibuprofen (ADVIL,MOTRIN) 200 MG tablet Take 200 mg by mouth every 6 (six) hours as needed.  Marland Kitchen albuterol-ipratropium (COMBIVENT) 18-103 MCG/ACT inhaler Inhale 2 puffs into the lungs every 6 (six) hours as needed for wheezing. For wheezing  . B Complex Vitamins (B COMPLEX-B12 PO) Take 1 tablet by mouth daily.  . clotrimazole (LOTRIMIN) 1 % cream Apply 1 application topically 2 (two) times daily. Apply to facial rash-avoid eyes  .  Fluocinolone Acetonide 0.01 % OIL Place 5 drops into the left ear 2 (two) times daily.  . magnesium oxide (MAG-OX) 400 MG tablet Take 400 mg by mouth See admin instructions. Take 1 tab 2 times daily x5 days then 1 tab daily thereafter  . pantoprazole (PROTONIX) 40 MG tablet Take 40 mg by mouth 2 (two) times daily.  . rifaximin (XIFAXAN) 550 MG TABS Take 550 mg by mouth 2 (two) times daily.    FAMILY HISTORY:  His indicated that the status of his mother is unknown.   SOCIAL HISTORY: He  reports that he quit smoking about 41 years ago. He has never used smokeless tobacco. He reports that he does not drink alcohol or use drugs.  REVIEW OF SYSTEMS:   Bolds are positive  Constitutional: weight loss, gain, night sweats, Fevers, chills, fatigue .  HEENT: headaches, Sore throat, sneezing, nasal congestion, post nasal drip, Difficulty swallowing, Tooth/dental problems, visual complaints visual changes, ear ache CV:  chest pain, radiates:,Orthopnea, PND, LLE swelling, dizziness, palpitations, syncope.  GI  heartburn, indigestion, abdominal pain, nausea, vomiting, diarrhea, change in bowel habits, loss of appetite, bloody stools.  Resp: cough, productive: , hemoptysis, dyspnea, chest pain, pleuritic.  Skin: rash or itching or icterus GU: dysuria, change in color of urine, urgency or frequency. flank pain, hematuria  MS: L hip pain or swelling. decreased range of motion  Psych: change in mood or affect. depression or anxiety.  Neuro: difficulty with speech, weakness, numbness, ataxia   SUBJECTIVE:    VITAL SIGNS: BP (!) 102/56   Pulse Marland Kitchen)  108   Temp 97.7 F (36.5 C) (Axillary)   Resp 17   Ht 5\' 10"  (1.778 m)   Wt 127.3 kg (280 lb 10.3 oz)   SpO2 96%   BMI 40.27 kg/m   HEMODYNAMICS:    VENTILATOR SETTINGS:    INTAKE / OUTPUT: No intake/output data recorded.  PHYSICAL EXAMINATION: General:  Obese male in mild distress r/t pain.  Neuro:  Alert, oriented, non-focal HEENT:  [place]/AT,  PERRL, jaundiced sclera.  Cardiovascular:  RRR, no MRG Lungs:  Clear, bilateral breath sounds Abdomen:  Soft, distended, fluctuant Musculoskeletal:  Edema to L hip and LLE. Skin:  Ecchymosis L hip.   LABS:  BMET Recent Labs  Lab 11/21/17 1530 11/22/17 1128  NA 136 137  K 3.2* 3.9  CL 107 108  CO2 18* 20*  BUN 10 12  CREATININE 0.99 1.56*  GLUCOSE 150* 151*    Electrolytes Recent Labs  Lab 11/21/17 1530 11/22/17 1128  CALCIUM 8.4* 8.1*  MG  --  1.2*    CBC Recent Labs  Lab 11/21/17 1530 11/22/17 1128 11/22/17 1333  WBC 11.5* 16.5* 16.1*  HGB 10.3* 7.4* 7.0*  HCT 30.3* 22.4* 20.8*  PLT 132* 126* 132*    Coag's Recent Labs  Lab 11/21/17 1530  INR 2.75    Sepsis Markers Recent Labs  Lab 11/21/17 1845  LATICACIDVEN 3.23*    ABG No results for input(s): PHART, PCO2ART, PO2ART in the last 168 hours.  Liver Enzymes Recent Labs  Lab 11/21/17 1530 11/22/17 1128  AST 81* 61*  ALT 35 31  ALKPHOS 143* 109  BILITOT 10.8* 9.6*  ALBUMIN 2.2* 1.8*    Cardiac Enzymes Recent Labs  Lab 11/21/17 1532 11/21/17 2333  TROPONINI 0.03* <0.03    Glucose Recent Labs  Lab 11/21/17 1529  GLUCAP 138*    Imaging Dg Chest 1 View  Result Date: 11/21/2017 CLINICAL DATA:  Near syncope with jaundice EXAM: CHEST  1 VIEW COMPARISON:  10/11/2017 FINDINGS: Possible trace left effusion. No focal consolidation. Stable cardiomediastinal silhouette with mild vascular congestion. Mild diffuse interstitial opacity compared to prior. No pneumothorax. IMPRESSION: 1. Probable tiny left effusion 2. Minimal vascular congestion. Mild diffuse increased interstitial opacity compared to prior, possible mild edema. Electronically Signed   By: Donavan Foil M.D.   On: 11/21/2017 18:38   Ct Head Wo Contrast  Result Date: 11/21/2017 CLINICAL DATA:  Altered mental status and seizure. EXAM: CT HEAD WITHOUT CONTRAST TECHNIQUE: Contiguous axial images were obtained from the base of the  skull through the vertex without intravenous contrast. COMPARISON:  CT head dated Dec 11, 2012. FINDINGS: Brain: No evidence of acute infarction, hemorrhage, hydrocephalus, extra-axial collection or mass lesion/mass effect. Stable cerebral atrophy and chronic microvascular ischemic changes. Vascular: No hyperdense vessel or unexpected calcification. Skull: Normal. Negative for fracture or focal lesion. Sinuses/Orbits: No acute finding. Other: None. IMPRESSION: 1.  No acute intracranial abnormality. Electronically Signed   By: Titus Dubin M.D.   On: 11/21/2017 16:02   Mr Brain Wo Contrast  Result Date: 11/22/2017 CLINICAL DATA:  Altered level of consciousness. Liver failure cirrhosis EXAM: MRI HEAD WITHOUT CONTRAST TECHNIQUE: Multiplanar, multiecho pulse sequences of the brain and surrounding structures were obtained without intravenous contrast. COMPARISON:  CT head 11/21/2017 FINDINGS: Brain: Moderate atrophy. Ventricular enlargement consistent with atrophy. Negative for acute infarct. Minimal chronic changes in the white matter. Negative for hemorrhage, mass, or fluid collection Vascular: Normal arterial flow voids Skull and upper cervical spine: Negative Sinuses/Orbits: Negative Other:  None IMPRESSION: Moderate atrophy.  No acute intracranial abnormality. Electronically Signed   By: Franchot Gallo M.D.   On: 11/22/2017 10:39   Ct Abdomen Pelvis W Contrast  Result Date: 11/21/2017 CLINICAL DATA:  Near syncope, jaundice, fall. History of liver failure. EXAM: CT ABDOMEN AND PELVIS WITH CONTRAST TECHNIQUE: Multidetector CT imaging of the abdomen and pelvis was performed using the standard protocol following bolus administration of intravenous contrast. CONTRAST:  150mL ISOVUE-300 IOPAMIDOL (ISOVUE-300) INJECTION 61% COMPARISON:  Right upper quadrant ultrasound dated 09/30/2017 FINDINGS: Lower chest: Trace left pleural effusion. Hepatobiliary: Cirrhosis.  No focal hepatic lesion is seen. Cholelithiasis,  without associated inflammatory changes. No intrahepatic or extrahepatic ductal dilatation. Pancreas: Mild fluid/ascites along the pancreatic tail (series 2/image 30), nonspecific. No pancreatic ductal dilatation or atrophy. Spleen: Spleen is normal in size. Adrenals/Urinary Tract: Adrenal glands are within normal limits. Kidneys are within normal limits. No hydronephrosis. Bladder is underdistended but unremarkable. Stomach/Bowel: Stomach is within normal limits. No evidence of bowel obstruction. Normal appendix (series 2/image 45). Mild sigmoid diverticulosis, without evidence of diverticulitis. Vascular/Lymphatic: No evidence of abdominal aortic aneurysm. Atherosclerotic calcifications of the abdominal aorta and branch vessels. Dominant splenorenal shunt.  Diminutive portal vein, patent. No suspicious abdominopelvic lymphadenopathy. Reproductive: Prostate is unremarkable. Other: Small volume abdominopelvic ascites, predominantly perihepatic and perisplenic. Fat/fluid in the left inguinal canal. Musculoskeletal: Visualized osseous structures are within normal limits. IMPRESSION: Mild fluid/ascites along the pancreatic tail, nonspecific. Mild acute pancreatitis is not excluded. However, this may also be related to the patient's abdominopelvic ascites. Cirrhosis. Portal vein is diminutive but patent. Dominant splenorenal shunt. Small volume abdominopelvic ascites. Cholelithiasis, without associated inflammatory changes. Trace left pleural effusion. Electronically Signed   By: Julian Hy M.D.   On: 11/21/2017 18:40   Dg Hip Unilat With Pelvis 2-3 Views Left  Result Date: 11/21/2017 CLINICAL DATA:  Pain following fall EXAM: DG HIP (WITH OR WITHOUT PELVIS) 2-3V LEFT COMPARISON:  None. FINDINGS: Frontal pelvis as well as frontal and lateral left hip images were obtained. No fracture or dislocation. There is slight symmetric narrowing of both hip joints. No erosive change. IMPRESSION: No fracture or  dislocation. There is slight symmetric narrowing of both hip joints. Electronically Signed   By: Lowella Grip III M.D.   On: 11/21/2017 14:59    STUDIES:  CT head 4/29 > No acute intracranial abnormality. CT abdomen/pelvis 4/29 > Mild fluid/ascites along the pancreatic tail, nonspecific. Mild acute pancreatitis is not excluded. However, this may also be related to the patient's abdominopelvic ascites. Cirrhosis. Portal vein is diminutive but patent. Dominant splenorenal shunt. Small volume abdominopelvic ascites. Cholelithiasis, without associated inflammatory changes.Trace left pleural effusion. DG hip L 4/30 > No fracture or dislocation. There is slight symmetric narrowing of both hip joints.  CULTURES:  ANTIBIOTICS:   SIGNIFICANT EVENTS: 4/29 admit 4/30 transfer to ICU  LINES/TUBES:   DISCUSSION: 67 year old male with alcoholic cirrhosis. He fell at work and presented to ED with syncope. Improved with volume. No hip fracture, but large L hip hematoma. Complicated by shock. Now transferring to ICU for transfusion.   ASSESSMENT / PLAN:  Shock: Secondary to Left thigh hematoma r/t fall. He is coagulopathic and thrombocytopenic in the setting of hepatic failure/cirrhosis. Distal pulses intact.  - Vitamin K and TXA given by primary - 2 units PRBC now. 2 units FFP now - Serial CBC - No further IVF resuscitation - Transfer to ICU for close monitoring - Ortho following, concerned this hematoma with turn into large  necrotic wound.  Hepatic failure in the setting of alcoholic cirrhosis: Does not seems to be encephalopathic. Ammonia 61. - Trend LFT - Consider lactulose if MS changes  Acute renal failure - likely due to shock, renal function normal on admit, now worse.  - Correcting shock - Follow BMP  Hypomagnesemia - Replete, follow  Seizure vs syncope in ED. MRI brain non-acute. Suspect this is syncope due to anemia.  - EEG pending.  - Carotid doppler pending.  - No  AED[facility]  Georgann Housekeeper, AGACNP-BC [facility] Pulmonology/Critical Care Pager [phone] or [phone]  11/22/2017 3:39 PM

## 2017-11-22 NOTE — Progress Notes (Addendum)
@IPLOG @        PROGRESS NOTE                                                                                                                                                                                                             Patient Demographics:    Logan Patrick, is a 67 y.o. male, DOB - 1951-06-25, ONG:[id number]  Admit date - 11/21/2017   Admitting Physician Jani Gravel, MD  Outpatient Primary MD for the patient is [name], MD  LOS - 1  Chief Complaint  Patient presents with  . Fall       Brief Narrative  - Logan Patrick  is a 67 y.o. male, w Etoh cirrhosis , hypertension who presents with c/o bending over at work and feeling light headed like he was going to have syncope and lost his  Balance.  Pt was brought into ED by ex- wife, In ED, pt apparently had episode of syncope, ? Seizure while in triage. He was incontinent of urine but denies biting tongue and there was not witnessed tonic clonic seizure.  This morning around 11:00 I was paged to come and see the patient as his left eye had a big bruise, arrived to see the patient and he has a large hematoma on the left thigh with some pain there, no signs of compartment syndrome, orthopedics was consulted right away.     Subjective:    Logan Patrick today has, No headache, No chest pain, No abdominal pain - No Nausea, No new weakness tingling or numbness, No Cough - SOB.  Has have pain in the left upper thigh.   Assessment  & Plan :    1.  Mechanical fall with large left thigh hematoma worse due to underlying cirrhosis with chronic thrombocytopenia and auto anticoagulation.  Causing acute blood loss related anemia, stat 2 units packed RBC transfusion rapid infusion, 4 x FFPs, vitamin K IV, tranexamic acid IV have been ordered, orthopedics on board.  Will monitor H&H.  Will monitor INR closely.  Upgrade him to stepdown.  Poor IV access hence I will request pulmonary to place a central line as well. DW Dr. Arita Miss [facility] as  well.  2.  Alcoholic cirrhosis with auto anticoagulation and ascites along with chronic thrombocytopenia.  Quit alcohol several years ago, supportive care with 4 units FFP,  vitamin K, tranexamic acid, diuresis and outpatient GI follow-up once stable.  Continue oral Xifaxan and lactulose for now.   3.  Syncope suspicious for seizure.  Could  have been simple orthostatic hypotension from blood loss however whole scenario unclear, MRI brain nonacute, pending echocardiogram EEG.  Currently due to #1 above further work-up will be held.  Once stable PT evaluation and orthostatic check.  Also carotid duplex ordered upon admission will be done and followed.  4.  Hypotension- due to blood loss related anemia, IV bolus, transfuse, monitor if needed IV  Pressors.  5. ARF - due to #1 and #4 - transfuse, avoid hypotension and nephrotoxins, monitor.   6. Hypomagnesemia - replaced, monitor.    Diet : Heart Healthy  Family Communication  : None  Code Status : Full  Disposition Plan  : Transfer to ICU as rapid infusion cannot be done in stepdown unit  Consults  : Orthopedics, PCCM  Procedures  :      DVT Prophylaxis  : None for now  Lab Results  Component Value Date   INR 2.75 11/21/2017   INR 1.52 (H) 12/04/2012   INR 1.52 (H) 12/03/2012     Lab Results  Component Value Date   PLT 126 (L) 11/22/2017    Inpatient Medications  Scheduled Meds: . feeding supplement (PRO-STAT SUGAR FREE 64)  30 mL Oral BID  . furosemide  10 mg Intravenous Q4H  . lactulose  30 g Oral Daily  . pantoprazole  40 mg Oral BID  . rifaximin  550 mg Oral BID  . sodium chloride flush  3 mL Intravenous Q12H   Continuous Infusions: . sodium chloride    . sodium chloride    . phytonadione (VITAMIN K) IV 10 mg (11/22/17 1338)  . sodium chloride    . tranexamic acid     PRN Meds:.sodium chloride, diphenhydrAMINE, iopamidol, ipratropium-albuterol, ondansetron (ZOFRAN) IV, sodium chloride flush  Antibiotics   :    Anti-infectives (From admission, onward)   Start     Dose/Rate Route Frequency Ordered Stop   11/21/17 2315  rifaximin (XIFAXAN) tablet 550 mg     550 mg Oral 2 times daily 11/21/17 2303     11/21/17 1745  cefTRIAXone (ROCEPHIN) 2 g in sodium chloride 0.9 % 100 mL IVPB     2 g 200 mL/hr over 30 Minutes Intravenous  Once 11/21/17 1732 11/21/17 1901         Objective:   Vitals:   11/22/17 0700 11/22/17 0810 11/22/17 1050 11/22/17 1205  BP:  112/65 111/76 (!) 103/55  Pulse:  76  80  Resp: 12 15 15 19   Temp:  98.5 F (36.9 C)  97.8 F (36.6 C)  TempSrc:  Oral  Oral  SpO2: 93% 93%  98%  Weight:      Height:        Wt Readings from Last 3 Encounters:  11/22/17 127.3 kg (280 lb 10.3 oz)  12/12/12 86.3 kg (190 lb 4.1 oz)     Intake/Output Summary (Last 24 hours) at 11/22/2017 1352 Last data filed at 11/22/2017 7322 Gross per 24 hour  Intake 0 ml  Output 200 ml  Net -200 ml     Physical Exam  Awake Alert, Oriented X 3, No new F.N deficits, Normal affect [place].AT,PERRAL Supple Neck,No JVD, No cervical lymphadenopathy appriciated.  Symmetrical Chest wall movement, Good air movement bilaterally, CTAB RRR,No Gallops,Rubs or new Murmurs, No Parasternal Heave +ve B.Sounds, Abd Soft, No tenderness, No organomegaly appriciated, No rebound - guarding or rigidity. No Cyanosis, Clubbing or edema, L thigh large hematoma, good sensation in toes and feet, no problems or pain with dorsiflexion/plantar  flexion of left ankle joint or toes    Data Review:    CBC Recent Labs  Lab 11/21/17 1530 11/22/17 1128  WBC 11.5* 16.5*  HGB 10.3* 7.4*  HCT 30.3* 22.4*  PLT 132* 126*  MCV 105.6* 105.7*  MCH 35.9* 34.9*  MCHC 34.0 33.0  RDW 17.4* 17.4*  LYMPHSABS 1.6  --   MONOABS 0.9  --   EOSABS 0.1  --   BASOSABS 0.1  --     Chemistries  Recent Labs  Lab 11/21/17 1530 11/22/17 1128  NA 136 137  K 3.2* 3.9  CL 107 108  CO2 18* 20*  GLUCOSE 150* 151*  BUN 10 12   CREATININE 0.99 1.56*  CALCIUM 8.4* 8.1*  MG  --  1.2*  AST 81* 61*  ALT 35 31  ALKPHOS 143* 109  BILITOT 10.8* 9.6*   ------------------------------------------------------------------------------------------------------------------ No results for input(s): CHOL, HDL, LDLCALC, TRIG, CHOLHDL, LDLDIRECT in the last 72 hours.  No results found for: HGBA1C ------------------------------------------------------------------------------------------------------------------ Recent Labs    11/21/17 2333  TSH 10.460*   ------------------------------------------------------------------------------------------------------------------ No results for input(s): VITAMINB12, FOLATE, FERRITIN, TIBC, IRON, RETICCTPCT in the last 72 hours.  Coagulation profile Recent Labs  Lab 11/21/17 1530  INR 2.75    No results for input(s): DDIMER in the last 72 hours.  Cardiac Enzymes Recent Labs  Lab 11/21/17 1532 11/21/17 2333  CKMB  --  5.7*  TROPONINI 0.03* <0.03   ------------------------------------------------------------------------------------------------------------------ No results found for: BNP  Micro Results Recent Results (from the past 240 hour(s))  Culture, blood (routine x 2)     Status: None (Preliminary result)   Collection Time: 11/21/17  3:20 PM  Result Value Ref Range Status   Specimen Description   Final    BLOOD LEFT ANTECUBITAL Performed at [facility], [address] [place], [place] [address]    Special Requests   Final    BOTTLES DRAWN AEROBIC AND ANAEROBIC Blood Culture adequate volume Performed at [facility], [address] [place], [place] [address]    Culture   Final    NO GROWTH < 12 HOURS Performed at [facility], [place] [address], [place], [place] [address]    Report Status PENDING  Incomplete  Culture, blood (routine x 2)     Status: None (Preliminary result)   Collection Time: 11/21/17  3:40 PM  Result Value Ref  Range Status   Specimen Description   Final    BLOOD LEFT HAND Performed at [facility], [address] [place], [place] [address]    Special Requests   Final    BOTTLES DRAWN AEROBIC AND ANAEROBIC Blood Culture adequate volume Performed at [facility], [address] [place], [place] [address]    Culture   Final    NO GROWTH < 12 HOURS Performed at [facility], [place] [address] [place], [place] [address]    Report Status PENDING  Incomplete    Radiology Reports Dg Chest 1 View  Result Date: 11/21/2017 CLINICAL DATA:  Near syncope with jaundice EXAM: CHEST  1 VIEW COMPARISON:  10/11/2017 FINDINGS: Possible trace left effusion. No focal consolidation. Stable cardiomediastinal silhouette with mild vascular congestion. Mild diffuse interstitial opacity compared to prior. No pneumothorax. IMPRESSION: 1. Probable tiny left effusion 2. Minimal vascular congestion. Mild diffuse increased interstitial opacity compared to prior, possible mild edema. Electronically Signed   By: Madie Reno.D.  On: 11/21/2017 18:38   Ct Head Wo Contrast  Result Date: 11/21/2017 CLINICAL DATA:  Altered mental status and seizure. EXAM: CT HEAD WITHOUT CONTRAST TECHNIQUE: Contiguous axial images were obtained from the base of the skull through the vertex without intravenous contrast. COMPARISON:  CT head dated Dec 11, 2012. FINDINGS: Brain: No evidence of acute infarction, hemorrhage, hydrocephalus, extra-axial collection or mass lesion/mass effect. Stable cerebral atrophy and chronic microvascular ischemic changes. Vascular: No hyperdense vessel or unexpected calcification. Skull: Normal. Negative for fracture or focal lesion. Sinuses/Orbits: No acute finding. Other: None. IMPRESSION: 1.  No acute intracranial abnormality. Electronically Signed   By: Titus Dubin M.D.   On: 11/21/2017 16:02   Mr Brain Wo Contrast  Result Date: 11/22/2017 CLINICAL DATA:  Altered level of  consciousness. Liver failure cirrhosis EXAM: MRI HEAD WITHOUT CONTRAST TECHNIQUE: Multiplanar, multiecho pulse sequences of the brain and surrounding structures were obtained without intravenous contrast. COMPARISON:  CT head 11/21/2017 FINDINGS: Brain: Moderate atrophy. Ventricular enlargement consistent with atrophy. Negative for acute infarct. Minimal chronic changes in the white matter. Negative for hemorrhage, mass, or fluid collection Vascular: Normal arterial flow voids Skull and upper cervical spine: Negative Sinuses/Orbits: Negative Other: None IMPRESSION: Moderate atrophy.  No acute intracranial abnormality. Electronically Signed   By: Franchot Gallo M.D.   On: 11/22/2017 10:39   Ct Abdomen Pelvis W Contrast  Result Date: 11/21/2017 CLINICAL DATA:  Near syncope, jaundice, fall. History of liver failure. EXAM: CT ABDOMEN AND PELVIS WITH CONTRAST TECHNIQUE: Multidetector CT imaging of the abdomen and pelvis was performed using the standard protocol following bolus administration of intravenous contrast. CONTRAST:  131mL ISOVUE-300 IOPAMIDOL (ISOVUE-300) INJECTION 61% COMPARISON:  Right upper quadrant ultrasound dated 09/30/2017 FINDINGS: Lower chest: Trace left pleural effusion. Hepatobiliary: Cirrhosis.  No focal hepatic lesion is seen. Cholelithiasis, without associated inflammatory changes. No intrahepatic or extrahepatic ductal dilatation. Pancreas: Mild fluid/ascites along the pancreatic tail (series 2/image 30), nonspecific. No pancreatic ductal dilatation or atrophy. Spleen: Spleen is normal in size. Adrenals/Urinary Tract: Adrenal glands are within normal limits. Kidneys are within normal limits. No hydronephrosis. Bladder is underdistended but unremarkable. Stomach/Bowel: Stomach is within normal limits. No evidence of bowel obstruction. Normal appendix (series 2/image 45). Mild sigmoid diverticulosis, without evidence of diverticulitis. Vascular/Lymphatic: No evidence of abdominal aortic  aneurysm. Atherosclerotic calcifications of the abdominal aorta and branch vessels. Dominant splenorenal shunt.  Diminutive portal vein, patent. No suspicious abdominopelvic lymphadenopathy. Reproductive: Prostate is unremarkable. Other: Small volume abdominopelvic ascites, predominantly perihepatic and perisplenic. Fat/fluid in the left inguinal canal. Musculoskeletal: Visualized osseous structures are within normal limits. IMPRESSION: Mild fluid/ascites along the pancreatic tail, nonspecific. Mild acute pancreatitis is not excluded. However, this may also be related to the patient's abdominopelvic ascites. Cirrhosis. Portal vein is diminutive but patent. Dominant splenorenal shunt. Small volume abdominopelvic ascites. Cholelithiasis, without associated inflammatory changes. Trace left pleural effusion. Electronically Signed   By: Julian Hy M.D.   On: 11/21/2017 18:40   Dg Hip Unilat With Pelvis 2-3 Views Left  Result Date: 11/21/2017 CLINICAL DATA:  Pain following fall EXAM: DG HIP (WITH OR WITHOUT PELVIS) 2-3V LEFT COMPARISON:  None. FINDINGS: Frontal pelvis as well as frontal and lateral left hip images were obtained. No fracture or dislocation. There is slight symmetric narrowing of both hip joints. No erosive change. IMPRESSION: No fracture or dislocation. There is slight symmetric narrowing of both hip joints. Electronically Signed   By: Lowella Grip III M.D.   On: 11/21/2017 14:59    Time  Spent in minutes  30   Lala Lund M.D on 11/22/2017 at 1:52 PM  Between 7am to 7pm - Pager - [phone] ( page via [email or web address], text pages only, please mention full 10 digit call back number). After 7pm go to [email or web address] - password [email or web address]

## 2017-11-23 ENCOUNTER — Inpatient Hospital Stay (HOSPITAL_COMMUNITY): Payer: PPO

## 2017-11-23 DIAGNOSIS — R55 Syncope and collapse: Secondary | ICD-10-CM

## 2017-11-23 DIAGNOSIS — D62 Acute posthemorrhagic anemia: Secondary | ICD-10-CM

## 2017-11-23 DIAGNOSIS — I361 Nonrheumatic tricuspid (valve) insufficiency: Secondary | ICD-10-CM

## 2017-11-23 LAB — PREPARE FRESH FROZEN PLASMA
UNIT DIVISION: 0
Unit division: 0
Unit division: 0

## 2017-11-23 LAB — BPAM FFP
BLOOD PRODUCT EXPIRATION DATE: 201904302359
BLOOD PRODUCT EXPIRATION DATE: 201905012359
Blood Product Expiration Date: 201904302359
Blood Product Expiration Date: 201905012359
ISSUE DATE / TIME: 201904301414
ISSUE DATE / TIME: 201904301414
ISSUE DATE / TIME: 201904301414
ISSUE DATE / TIME: 201904301414
UNIT TYPE AND RH: 6200
UNIT TYPE AND RH: 6200
UNIT TYPE AND RH: 6200
Unit Type and Rh: 6200

## 2017-11-23 LAB — CBC
HCT: 20.2 % — ABNORMAL LOW (ref 39.0–52.0)
HEMATOCRIT: 21.1 % — AB (ref 39.0–52.0)
HEMATOCRIT: 20.4 % — AB (ref 39.0–52.0)
HEMOGLOBIN: 7.2 g/dL — AB (ref 13.0–17.0)
HEMOGLOBIN: 7 g/dL — AB (ref 13.0–17.0)
HEMOGLOBIN: 6.8 g/dL — AB (ref 13.0–17.0)
MCH: 32.3 pg (ref 26.0–34.0)
MCH: 33.2 pg (ref 26.0–34.0)
MCH: 33.7 pg (ref 26.0–34.0)
MCHC: 33.7 g/dL (ref 30.0–36.0)
MCHC: 34.1 g/dL (ref 30.0–36.0)
MCHC: 34.3 g/dL (ref 30.0–36.0)
MCV: 94.6 fL (ref 78.0–100.0)
MCV: 98.1 fL (ref 78.0–100.0)
MCV: 98.5 fL (ref 78.0–100.0)
Platelets: 78 10*3/uL — ABNORMAL LOW (ref 150–400)
Platelets: 88 10*3/uL — ABNORMAL LOW (ref 150–400)
Platelets: 90 10*3/uL — ABNORMAL LOW (ref 150–400)
RBC: 2.05 MIL/uL — AB (ref 4.22–5.81)
RBC: 2.08 MIL/uL — ABNORMAL LOW (ref 4.22–5.81)
RBC: 2.23 MIL/uL — ABNORMAL LOW (ref 4.22–5.81)
RDW: 20.1 % — AB (ref 11.5–15.5)
RDW: 21.1 % — ABNORMAL HIGH (ref 11.5–15.5)
RDW: 21.5 % — ABNORMAL HIGH (ref 11.5–15.5)
WBC: 13.7 10*3/uL — AB (ref 4.0–10.5)
WBC: 17.5 10*3/uL — ABNORMAL HIGH (ref 4.0–10.5)
WBC: 17.6 10*3/uL — ABNORMAL HIGH (ref 4.0–10.5)

## 2017-11-23 LAB — BASIC METABOLIC PANEL
ANION GAP: 12 (ref 5–15)
BUN: 17 mg/dL (ref 6–20)
CHLORIDE: 102 mmol/L (ref 101–111)
CO2: 20 mmol/L — AB (ref 22–32)
Calcium: 8.2 mg/dL — ABNORMAL LOW (ref 8.9–10.3)
Creatinine, Ser: 1.95 mg/dL — ABNORMAL HIGH (ref 0.61–1.24)
GFR calc non Af Amer: 34 mL/min — ABNORMAL LOW (ref >60)
GFR, EST AFRICAN AMERICAN: 39 mL/min — AB (ref >60)
Glucose, Bld: 111 mg/dL — ABNORMAL HIGH (ref 65–99)
Potassium: 4 mmol/L (ref 3.5–5.1)
Sodium: 134 mmol/L — ABNORMAL LOW (ref 135–145)

## 2017-11-23 LAB — PREPARE RBC (CROSSMATCH)

## 2017-11-23 LAB — BLOOD PRODUCT ORDER (VERBAL) VERIFICATION

## 2017-11-23 LAB — PROTIME-INR
INR: 2.15
Prothrombin Time: 23.8 seconds — ABNORMAL HIGH (ref 11.4–15.2)

## 2017-11-23 LAB — ECHOCARDIOGRAM COMPLETE
HEIGHTINCHES: 70 in
Weight: 4726.66 oz

## 2017-11-23 LAB — MAGNESIUM: Magnesium: 1.6 mg/dL — ABNORMAL LOW (ref 1.7–2.4)

## 2017-11-23 MED ORDER — SODIUM CHLORIDE 0.9 % IV SOLN
Freq: Once | INTRAVENOUS | Status: AC
  Administered 2017-11-23: 08:00:00 via INTRAVENOUS

## 2017-11-23 MED ORDER — PROMETHAZINE HCL 25 MG/ML IJ SOLN: 6.2500 mg | Freq: Three times a day (TID) | INTRAMUSCULAR | Status: DC | PRN

## 2017-11-23 MED ORDER — FUROSEMIDE 10 MG/ML IJ SOLN
20.0000 mg | Freq: Once | INTRAMUSCULAR | Status: AC
  Administered 2017-11-23: 20 mg via INTRAVENOUS
  Filled 2017-11-23: qty 2

## 2017-11-23 MED ORDER — FUROSEMIDE 10 MG/ML IJ SOLN
40.0000 mg | Freq: Once | INTRAMUSCULAR | Status: AC
  Administered 2017-11-23: 40 mg via INTRAVENOUS
  Filled 2017-11-23: qty 4

## 2017-11-23 MED ORDER — VITAMIN K1 10 MG/ML IJ SOLN
10.0000 mg | Freq: Once | INTRAVENOUS | Status: AC
  Administered 2017-11-23: 10 mg via INTRAVENOUS
  Filled 2017-11-23: qty 1

## 2017-11-23 MED ORDER — ALBUMIN HUMAN 5 % IV SOLN
12.5000 g | Freq: Once | INTRAVENOUS | Status: AC
  Administered 2017-11-23: 12.5 g via INTRAVENOUS

## 2017-11-23 MED ORDER — ALBUMIN HUMAN 5 % IV SOLN
INTRAVENOUS | Status: AC
  Filled 2017-11-23: qty 250

## 2017-11-23 MED ORDER — SODIUM CHLORIDE 0.9 % IV SOLN
Freq: Once | INTRAVENOUS | Status: AC
  Administered 2017-11-23: 15:00:00 via INTRAVENOUS

## 2017-11-23 NOTE — Progress Notes (Signed)
Story Progress Note Patient Name: Logan Patrick DOB: Jun 22, 1951 MRN: [id number]   Date of Service  11/23/2017  HPI/Events of Note  Oliguria - Albumin = 1.8.   eICU Interventions  Will order: 1. Albumin 5% 12.5 gm IV now.      Intervention Category Intermediate Interventions: Oliguria - evaluation and management  Sommer,Steven Eugene 11/23/2017, 5:22 AM

## 2017-11-23 NOTE — Progress Notes (Addendum)
@IPLOG @        PROGRESS NOTE                                                                                                                                                                                                             Patient Demographics:    Logan Patrick, is a 67 y.o. male, DOB - Jun 19, 1951, ATF:[id number]  Admit date - 11/21/2017   Admitting Physician Jani Gravel, MD  Outpatient Primary MD for the patient is [name], MD  LOS - 2  Chief Complaint  Patient presents with  . Fall       Brief Narrative  - Logan Patrick  is a 67 y.o. male, w Etoh cirrhosis , hypertension who presents with c/o bending over at work and feeling light headed like he was going to have syncope and lost his  Balance.  Pt was brought into ED by ex- wife, In ED, pt apparently had episode of syncope,    Subjective:    Logan Patrick continues to have severe thigh swelling   Assessment  & Plan :    1.  Massive left thigh hematoma/hemorrhagic shock -Following mechanical fall, worsened by auto anticoagulation and chronic, cytopenia from underlying cirrhosis -Status post 4 units of FFP, vitamin K and 2 units of PRBC transfusion yesterday -PCCM following, case d/w [facility] Dr.Brabham by Dr.Singh yesterday -hemoglobin is 6.8 this morning with INR 2.1, will transfuse 2 more units of PRBC, and give 2 more units of FFP -Elevated left leg as possible -Lasix 1 now   2. Acute blood loss anemia -Secondary to massive left thigh hematoma as noted above -Status post 2 units PRBC yesterday for 2 more units this morning -Monitor CBC every 12  3.  Alcoholic cirrhosis with auto anticoagulation and ascites along with chronic thrombocytopenia.  Quit alcohol several years ago, supportive care - Continue oral Xifaxan and lactulose for now.   3.  Syncope  -Likely secondary to blood loss anemia and orthostatic hypotension -MRI brain was done on admission which is unrevealing -EEG noted diffuse slowing  4.   Hypotension- due to blood loss related anemia, IV bolus, transfuse -blood pressure improved  5. ARF - due to #1 and #4 - transfuse, avoid hypotension and nephrotoxins, monitor. -volume expansion with blood and FFP today  6. Hypomagnesemia - replaced, monitor.  DVT prophylaxis: SCDs  Diet : Heart Healthy  Family Communication  : None  Code Status : Full  Disposition Plan  : keep in ICU today  Consults  :  Orthopedics, PCCM  Procedures  :      DVT Prophylaxis  : None for now  Lab Results  Component Value Date   INR 2.15 11/23/2017   INR 2.10 11/22/2017   INR 2.75 11/21/2017     Lab Results  Component Value Date   PLT 88 (L) 11/23/2017    Inpatient Medications  Scheduled Meds: . feeding supplement (PRO-STAT SUGAR FREE 64)  30 mL Oral BID  . lactulose  30 g Oral Daily  . pantoprazole  40 mg Oral BID  . rifaximin  550 mg Oral BID   Continuous Infusions: . sodium chloride     PRN Meds:.diphenhydrAMINE, iopamidol, ipratropium-albuterol, morphine injection, ondansetron (ZOFRAN) IV, promethazine, sodium chloride, sodium chloride flush  Antibiotics  :    Anti-infectives (From admission, onward)   Start     Dose/Rate Route Frequency Ordered Stop   11/21/17 2315  rifaximin (XIFAXAN) tablet 550 mg     550 mg Oral 2 times daily 11/21/17 2303     11/21/17 1745  cefTRIAXone (ROCEPHIN) 2 g in sodium chloride 0.9 % 100 mL IVPB     2 g 200 mL/hr over 30 Minutes Intravenous  Once 11/21/17 1732 11/21/17 1901         Objective:   Vitals:   11/23/17 1300 11/23/17 1320 11/23/17 1331 11/23/17 1400  BP: 118/68  115/67 (!) 99/55  Pulse: (!) 102 (!) 101 98 (!) 104  Resp: 17 18 11  (!) 26  Temp: 97.9 F (36.6 C) 98.1 F (36.7 C) 98.1 F (36.7 C) 98.1 F (36.7 C)  TempSrc:   Core   SpO2: 98% 98% 98% 97%  Weight:      Height:        Wt Readings from Last 3 Encounters:  11/23/17 134 kg (295 lb 6.7 oz)  12/12/12 86.3 kg (190 lb 4.1 oz)     Intake/Output Summary  (Last 24 hours) at 11/23/2017 1443 Last data filed at 11/23/2017 1300 Gross per 24 hour  Intake 4009.2 ml  Output 265 ml  Net 3744.2 ml     Physical Exam  Gen: Awake, Alert, Oriented X 3, chronically ill-appearing HEENT: PERRLA, Neck supple, no JVD Lungs: Good air movement bilaterally, diminished breath sounds in both bases CVS: RRR,No Gallops,Rubs or new Murmurs Abd: soft, Non tender, non distended, BS present Extremities: massive left thigh hematoma with bluish/purplish discoloration of most of his upper thigh,, 2+ edema in the lower leg, good sensations and papillary refill intact in his feet but Skin: as above     Data Review:    CBC Recent Labs  Lab 11/21/17 1530 11/22/17 1128 11/22/17 1333 11/22/17 1842 11/23/17 0023 11/23/17 0603  WBC 11.5* 16.5* 16.1* 15.1* 17.6* 17.5*  HGB 10.3* 7.4* 7.0* 7.1* 7.0* 6.8*  HCT 30.3* 22.4* 20.8* 21.1* 20.4* 20.2*  PLT 132* 126* 132* 88* 90* 88*  MCV 105.6* 105.7* 105.6* 99.1 98.1 98.5  MCH 35.9* 34.9* 35.5* 33.3 33.7 33.2  MCHC 34.0 33.0 33.7 33.6 34.3 33.7  RDW 17.4* 17.4* 17.7* 20.1* 21.1* 21.5*  LYMPHSABS 1.6  --   --   --   --   --   MONOABS 0.9  --   --   --   --   --   EOSABS 0.1  --   --   --   --   --   BASOSABS 0.1  --   --   --   --   --     Chemistries  Recent Labs  Lab 11/21/17 1530 11/22/17 1128 11/23/17 0603  NA 136 137 134*  K 3.2* 3.9 4.0  CL 107 108 102  CO2 18* 20* 20*  GLUCOSE 150* 151* 111*  BUN 10 12 17   CREATININE 0.99 1.56* 1.95*  CALCIUM 8.4* 8.1* 8.2*  MG  --  1.2* 1.6*  AST 81* 61*  --   ALT 35 31  --   ALKPHOS 143* 109  --   BILITOT 10.8* 9.6*  --    ------------------------------------------------------------------------------------------------------------------ No results for input(s): CHOL, HDL, LDLCALC, TRIG, CHOLHDL, LDLDIRECT in the last 72 hours.  No results found for:  HGBA1C ------------------------------------------------------------------------------------------------------------------ Recent Labs    11/21/17 2333  TSH 10.460*   ------------------------------------------------------------------------------------------------------------------ No results for input(s): VITAMINB12, FOLATE, FERRITIN, TIBC, IRON, RETICCTPCT in the last 72 hours.  Coagulation profile Recent Labs  Lab 11/21/17 1530 11/22/17 1842 11/23/17 0603  INR 2.75 2.10 2.15    No results for input(s): DDIMER in the last 72 hours.  Cardiac Enzymes Recent Labs  Lab 11/21/17 1532 11/21/17 2333  CKMB  --  5.7*  TROPONINI 0.03* <0.03   ------------------------------------------------------------------------------------------------------------------ No results found for: BNP  Micro Results Recent Results (from the past 240 hour(s))  Culture, blood (routine x 2)     Status: None (Preliminary result)   Collection Time: 11/21/17  3:20 PM  Result Value Ref Range Status   Specimen Description   Final    BLOOD LEFT ANTECUBITAL Performed at [facility], [address] [place], [place] [address]    Special Requests   Final    BOTTLES DRAWN AEROBIC AND ANAEROBIC Blood Culture adequate volume Performed at [facility], [address] [place], [place] [address]    Culture   Final    NO GROWTH 2 DAYS Performed at [facility], [place] [address] [place], [place] [address]    Report Status PENDING  Incomplete  Culture, blood (routine x 2)     Status: None (Preliminary result)   Collection Time: 11/21/17  3:40 PM  Result Value Ref Range Status   Specimen Description   Final    BLOOD LEFT HAND Performed at [facility], [address] [place], [place] [address]    Special Requests   Final    BOTTLES DRAWN AEROBIC AND ANAEROBIC Blood Culture adequate volume Performed at [facility], [address] [place], [place] [address]     Culture   Final    NO GROWTH 2 DAYS Performed at [facility], [place] [address] [place], [place] [address]    Report Status PENDING  Incomplete  MRSA PCR Screening     Status: None   Collection Time: 11/22/17  3:34 PM  Result Value Ref Range Status   MRSA by PCR NEGATIVE NEGATIVE Final    Comment:        The GeneXpert MRSA Assay (FDA approved for NASAL specimens only), is one component of a comprehensive MRSA colonization surveillance program. It is not intended to diagnose MRSA infection nor to guide or monitor treatment for MRSA infections. Performed at [facility], [place] [address], [place], [place] [address]     Radiology Reports Dg Chest 1 View  Result Date: 11/21/2017 CLINICAL DATA:  Near syncope with jaundice EXAM: CHEST  1 VIEW COMPARISON:  10/11/2017 FINDINGS: Possible trace left effusion. No focal consolidation. Stable cardiomediastinal silhouette with mild vascular congestion. Mild diffuse interstitial opacity compared  to prior. No pneumothorax. IMPRESSION: 1. Probable tiny left effusion 2. Minimal vascular congestion. Mild diffuse increased interstitial opacity compared to prior, possible mild edema. Electronically Signed   By: Donavan Foil M.D.   On: 11/21/2017 18:38   Ct Head Wo Contrast  Result Date: 11/21/2017 CLINICAL DATA:  Altered mental status and seizure. EXAM: CT HEAD WITHOUT CONTRAST TECHNIQUE: Contiguous axial images were obtained from the base of the skull through the vertex without intravenous contrast. COMPARISON:  CT head dated Dec 11, 2012. FINDINGS: Brain: No evidence of acute infarction, hemorrhage, hydrocephalus, extra-axial collection or mass lesion/mass effect. Stable cerebral atrophy and chronic microvascular ischemic changes. Vascular: No hyperdense vessel or unexpected calcification. Skull: Normal. Negative for fracture or focal lesion. Sinuses/Orbits: No acute finding. Other: None. IMPRESSION: 1.  No acute intracranial abnormality.  Electronically Signed   By: Titus Dubin M.D.   On: 11/21/2017 16:02   Mr Brain Wo Contrast  Result Date: 11/22/2017 CLINICAL DATA:  Altered level of consciousness. Liver failure cirrhosis EXAM: MRI HEAD WITHOUT CONTRAST TECHNIQUE: Multiplanar, multiecho pulse sequences of the brain and surrounding structures were obtained without intravenous contrast. COMPARISON:  CT head 11/21/2017 FINDINGS: Brain: Moderate atrophy. Ventricular enlargement consistent with atrophy. Negative for acute infarct. Minimal chronic changes in the white matter. Negative for hemorrhage, mass, or fluid collection Vascular: Normal arterial flow voids Skull and upper cervical spine: Negative Sinuses/Orbits: Negative Other: None IMPRESSION: Moderate atrophy.  No acute intracranial abnormality. Electronically Signed   By: Franchot Gallo M.D.   On: 11/22/2017 10:39   Ct Abdomen Pelvis W Contrast  Result Date: 11/21/2017 CLINICAL DATA:  Near syncope, jaundice, fall. History of liver failure. EXAM: CT ABDOMEN AND PELVIS WITH CONTRAST TECHNIQUE: Multidetector CT imaging of the abdomen and pelvis was performed using the standard protocol following bolus administration of intravenous contrast. CONTRAST:  15mL ISOVUE-300 IOPAMIDOL (ISOVUE-300) INJECTION 61% COMPARISON:  Right upper quadrant ultrasound dated 09/30/2017 FINDINGS: Lower chest: Trace left pleural effusion. Hepatobiliary: Cirrhosis.  No focal hepatic lesion is seen. Cholelithiasis, without associated inflammatory changes. No intrahepatic or extrahepatic ductal dilatation. Pancreas: Mild fluid/ascites along the pancreatic tail (series 2/image 30), nonspecific. No pancreatic ductal dilatation or atrophy. Spleen: Spleen is normal in size. Adrenals/Urinary Tract: Adrenal glands are within normal limits. Kidneys are within normal limits. No hydronephrosis. Bladder is underdistended but unremarkable. Stomach/Bowel: Stomach is within normal limits. No evidence of bowel obstruction.  Normal appendix (series 2/image 45). Mild sigmoid diverticulosis, without evidence of diverticulitis. Vascular/Lymphatic: No evidence of abdominal aortic aneurysm. Atherosclerotic calcifications of the abdominal aorta and branch vessels. Dominant splenorenal shunt.  Diminutive portal vein, patent. No suspicious abdominopelvic lymphadenopathy. Reproductive: Prostate is unremarkable. Other: Small volume abdominopelvic ascites, predominantly perihepatic and perisplenic. Fat/fluid in the left inguinal canal. Musculoskeletal: Visualized osseous structures are within normal limits. IMPRESSION: Mild fluid/ascites along the pancreatic tail, nonspecific. Mild acute pancreatitis is not excluded. However, this may also be related to the patient's abdominopelvic ascites. Cirrhosis. Portal vein is diminutive but patent. Dominant splenorenal shunt. Small volume abdominopelvic ascites. Cholelithiasis, without associated inflammatory changes. Trace left pleural effusion. Electronically Signed   By: Julian Hy M.D.   On: 11/21/2017 18:40   Dg Hip Unilat With Pelvis 2-3 Views Left  Result Date: 11/21/2017 CLINICAL DATA:  Pain following fall EXAM: DG HIP (WITH OR WITHOUT PELVIS) 2-3V LEFT COMPARISON:  None. FINDINGS: Frontal pelvis as well as frontal and lateral left hip images were obtained. No fracture or dislocation. There is slight symmetric narrowing of both hip joints.  No erosive change. IMPRESSION: No fracture or dislocation. There is slight symmetric narrowing of both hip joints. Electronically Signed   By: Lowella Grip III M.D.   On: 11/21/2017 14:59    Time Spent in minutes  30   Domenic Polite M.D on 11/23/2017 at 2:43 PM  Between 7am to 7pm - Page via [email or web address] - password [email or web address]         Thank you for

## 2017-11-23 NOTE — Progress Notes (Signed)
Lab called with critical hemoglobin.  Hgb 6.8   Attending has been paged with results.

## 2017-11-23 NOTE — Procedures (Signed)
ELECTROENCEPHALOGRAM REPORT  Date of Study: 11/23/2017  Patient's Name: Logan Patrick MRN: [id number] Date of Birth: Nov 14, 1950  Referring Provider: Lala Lund, MD  Clinical History: 67 year old male with history of alcoholic cirrhosis presents for evaluation of syncope versus seizure.  Medications: diphenhydramine Morphine Ondansetron Rifaximin  Technical Summary: A multichannel digital EEG recording measured by the international 10-20 system with electrodes applied with paste and impedances below 5000 ohms performed as portable with EKG monitoring in an awake and drowsy patient.  Hyperventilation and photic stimulation were not performed.  The digital EEG was referentially recorded, reformatted, and digitally filtered in a variety of bipolar and referential montages for optimal display.   Description: The patient is awake and drowsy during the recording.  During maximal wakefulness, there is a symmetric, medium voltage 7 Hz posterior dominant rhythm that attenuates with eye opening. This is admixed with a mild amount of diffuse 4-5 Hz theta and 2-3 Hz delta slowing of the waking background.  During drowsiness and sleep, there is an increase in theta slowing of the background.  Vertex waves and symmetric sleep spindles were seen.  There were no epileptiform discharges or electrographic seizures seen.    EKG lead was unremarkable.  Impression: This awake and asleep EEG is abnormal due to mild diffuse slowing of the waking background.  Clinical Correlation of the above findings indicates diffuse cerebral dysfunction that is non-specific in etiology and can be seen with hypoxic/ischemic injury, toxic/metabolic encephalopathies, neurodegenerative disorders, or medication effect.  The absence of epileptiform discharges does not rule out a clinical diagnosis of epilepsy.  Clinical correlation is advised.   Metta Clines, DO

## 2017-11-23 NOTE — Progress Notes (Addendum)
PT Cancellation Note  Patient Details Name: Logan Patrick MRN: [id number] DOB: 1950/11/02   Cancelled Treatment:    Reason Eval/Treat Not Completed: Medical issues which prohibited therapy. Critical Hgb value <7.0. Will follow-up for appropriateness for PT evaluation as schedule permits.  3:30pm: Followed up with RN who reports pt still inappropriate for PT evaluation today. Awaiting repeat lab draw post-PRBC infusion and awaiting additional FFP; left thigh hematoma worsening. Will follow-up for evaluation tomorrow as appropriate.  Mabeline Caras, PT, DPT Acute Rehab Services  Pager: [place] 11/23/2017, 7:44 AM

## 2017-11-23 NOTE — Progress Notes (Signed)
PULMONARY / CRITICAL CARE MEDICINE   Name: Logan Patrick MRN: [id number] DOB: 09-27-1950    ADMISSION DATE:  11/21/2017 CONSULTATION DATE:  4/30  REFERRING MD:  Dr. 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  4/28 while at work he tripped and fell to the ground where he laid for about 20 minutes before being found.  Initially he was able to ambulate without  much pain, but the following day, pain worsened and his L hip became discolored causing him to present to the emergency department. He suffered a syncopal event versus seizure in ED, but resolved and felt better with IVF resuscitation.   He was admitted to the hospitalist service for syncope and hypotension.  4/30 he developed worsening hypotension. HGB checked and was acutely decreased. He was given vit K, FFP, and TXA in preparation for blood product administration. PCCM consulted for ICU transfer.     SUBJECTIVE:  67 year old male who is awake alert receiving transfusion continues to have extension of his left thigh hematoma.  VITAL SIGNS: BP (!) 143/57   Pulse (!) 110   Temp 98.1 F (36.7 C)   Resp 20   Ht 5\' 10"  (1.778 m)   Wt 134 kg (295 lb 6.7 oz)   SpO2 96%   BMI 42.39 kg/m   HEMODYNAMICS:    VENTILATOR SETTINGS:    INTAKE / OUTPUT: I/O last 3 completed shifts: In: 2715.7 [P.O.:720; I.V.:257; Blood:1688.7; IV Piggyback:50] Out: 375 [Urine:175; Emesis/NG output:200]  PHYSICAL EXAMINATION: General: Jaundiced male in no acute distress on room air HEENT: Sclerae jaundice, no JVD or lymphadenopathy is appreciated Neuro: Intact follows commands CV: Heart sounds are regular rate rate rhythm but requiring pressor support currently being transfused PULM: Decreased breath sounds in the bases YS:AYTK, non-tender, bsx4 active   GU: Reports pink-colored urine Extremities: Bilateral lower extremity edema is noted 3+, left thigh hematoma is marked in shoes extension beyond the previous markings.  Hip is tight. Skin: Jaundiced throughout    LABS:  BMET Recent Labs  Lab 11/21/17 1530 11/22/17 1128 11/23/17 0603  NA 136 137 134*  K 3.2* 3.9 4.0  CL 107 108 102  CO2 18* 20* 20*  BUN 10 12 17   CREATININE 0.99 1.56* 1.95*  GLUCOSE 150* 151* 111*    Electrolytes Recent Labs  Lab 11/21/17 1530 11/22/17 1128 11/23/17 0603  CALCIUM 8.4* 8.1* 8.2*  MG  --  1.2* 1.6*    CBC Recent Labs  Lab 11/22/17 1842 11/23/17 0023 11/23/17 0603  WBC 15.1* 17.6* 17.5*  HGB 7.1* 7.0* 6.8*  HCT 21.1* 20.4* 20.2*  PLT 88* 90* 88*    Coag's Recent Labs  Lab 11/21/17 1530 11/22/17 1333 11/22/17 1842 11/23/17 0603  APTT  --  50*  --   --   INR 2.75  --  2.10 2.15    Sepsis Markers Recent Labs  Lab 11/21/17 1845  LATICACIDVEN 3.23*    ABG No results for input(s): PHART, PCO2ART, PO2ART in the last 168 hours.  Liver Enzymes Recent Labs  Lab 11/21/17 1530 11/22/17 1128  AST 81* 61*  ALT 35 31  ALKPHOS 143* 109  BILITOT 10.8* 9.6*  ALBUMIN 2.2* 1.8*    Cardiac Enzymes Recent Labs  Lab 11/21/17 1532 11/21/17 2333  TROPONINI 0.03* <0.03    Glucose Recent Labs  Lab 11/21/17 1529  GLUCAP 138*    Imaging Mr Brain Wo Contrast  Result Date: 11/22/2017 CLINICAL DATA:  Altered level of consciousness. Liver failure cirrhosis EXAM: MRI HEAD WITHOUT CONTRAST TECHNIQUE: Multiplanar, multiecho pulse sequences of the brain and surrounding structures were obtained without intravenous contrast. COMPARISON:  CT head 11/21/2017 FINDINGS: Brain: Moderate atrophy. Ventricular enlargement consistent with atrophy. Negative for acute infarct. Minimal chronic changes in the white matter. Negative for hemorrhage, mass, or fluid collection Vascular: Normal arterial flow voids Skull and upper cervical spine:  Negative Sinuses/Orbits: Negative Other: None IMPRESSION: Moderate atrophy.  No acute intracranial abnormality. Electronically Signed   By: Franchot Gallo M.D.   On: 11/22/2017 10:39    STUDIES:  CT head 4/29 > No acute intracranial abnormality. CT abdomen/pelvis 4/29 > Mild fluid/ascites along the pancreatic tail, nonspecific. Mild acute pancreatitis is not excluded. However, this may also be related to the patient's abdominopelvic ascites. Cirrhosis. Portal vein is diminutive but patent. Dominant splenorenal shunt. Small volume abdominopelvic ascites. Cholelithiasis, without associated inflammatory changes.Trace left pleural effusion. DG hip L 4/30 > No fracture or dislocation. There is slight symmetric narrowing of both hip joints.  CULTURES:  ANTIBIOTICS:   SIGNIFICANT EVENTS: 4/29 admit 4/30 transfer to ICU 11/23/2017 transfusion  LINES/TUBES:   DISCUSSION: 67 year old male with alcoholic cirrhosis. He fell at work and presented to ED with syncope. Improved with volume. No hip fracture, but large L hip hematoma. Complicated by shock. Now transferring to ICU for transfusion.  11/23/2017 hemodynamic stable not requiring pressors but he does require transfusion.  Continue to monitor in the intensive care unit.  ASSESSMENT / PLAN:  Shock: Secondary to Left thigh hematoma r/t fall. He is coagulopathic and thrombocytopenic in the setting of hepatic failure/cirrhosis. Distal pulses intact.  -Reversal with vitamin K -Monitor chronic H&H -Monitor coagulation studies -Transfuse as needed -Orthopedics managing thigh hematoma  Hepatic failure in the setting of alcoholic cirrhosis: Does not seems to be encephalopathic. Ammonia 61. -Follow LFTs  Acute renal failure Lab Results  Component Value Date   CREATININE 1.95 (H) 11/23/2017   CREATININE 1.56 (H) 11/22/2017   CREATININE 0.99 11/21/2017   Recent Labs  Lab 11/21/17 1530 11/22/17 1128 11/23/17 0603  K 3.2* 3.9 4.0       -Shock may be a component -Continue to monitor  Hypomagnesemia -Repleted to 1.6 - continue to follow  Seizure vs syncope in ED. MRI brain non-acute. Suspect this is syncope due to anemia.  -EEG is in progress -Carotid Doppler studies are pending -11/23/2017 awake alert no acute distress  Richardson Landry Karas Pickerill ACNP Maryanna Shape PCCM Pager [phone] till 1 pm If no answer page [phone] 11/23/2017, 9:21 AM

## 2017-11-23 NOTE — Progress Notes (Signed)
  Echocardiogram 2D Echocardiogram has been performed.  Merrie Roof F 11/23/2017, 11:19 AM

## 2017-11-23 NOTE — Progress Notes (Signed)
Bilateral carotid duplex completed. Preliminary results. Technically difficult due to severe respiratory interference. 1% to 39% ICA stenosis. Vertebral artery flow is antegrade. [facility], [place]  11/23/2017 2:55 PM

## 2017-11-23 NOTE — Progress Notes (Signed)
EEG completed; results pending.    

## 2017-11-24 ENCOUNTER — Inpatient Hospital Stay (HOSPITAL_COMMUNITY): Payer: PPO

## 2017-11-24 DIAGNOSIS — S7002XA Contusion of left hip, initial encounter: Secondary | ICD-10-CM

## 2017-11-24 DIAGNOSIS — K7460 Unspecified cirrhosis of liver: Secondary | ICD-10-CM

## 2017-11-24 LAB — APTT: APTT: 44 s — AB (ref 24–36)

## 2017-11-24 LAB — BPAM FFP
BLOOD PRODUCT EXPIRATION DATE: 201905012359
BLOOD PRODUCT EXPIRATION DATE: 201905012359
ISSUE DATE / TIME: 201905011514
ISSUE DATE / TIME: 201905011615
UNIT TYPE AND RH: 6200
Unit Type and Rh: 6200

## 2017-11-24 LAB — CBC
HCT: 21.1 % — ABNORMAL LOW (ref 39.0–52.0)
HEMATOCRIT: 21.4 % — AB (ref 39.0–52.0)
HEMOGLOBIN: 7.2 g/dL — AB (ref 13.0–17.0)
HEMOGLOBIN: 7.2 g/dL — AB (ref 13.0–17.0)
MCH: 32.1 pg (ref 26.0–34.0)
MCH: 32.7 pg (ref 26.0–34.0)
MCHC: 33.6 g/dL (ref 30.0–36.0)
MCHC: 34.1 g/dL (ref 30.0–36.0)
MCV: 95.5 fL (ref 78.0–100.0)
MCV: 95.9 fL (ref 78.0–100.0)
Platelets: 69 10*3/uL — ABNORMAL LOW (ref 150–400)
Platelets: 71 10*3/uL — ABNORMAL LOW (ref 150–400)
RBC: 2.24 MIL/uL — ABNORMAL LOW (ref 4.22–5.81)
RBC: 2.2 MIL/uL — AB (ref 4.22–5.81)
RDW: 20.5 % — ABNORMAL HIGH (ref 11.5–15.5)
RDW: 20 % — ABNORMAL HIGH (ref 11.5–15.5)
WBC: 12.3 10*3/uL — ABNORMAL HIGH (ref 4.0–10.5)
WBC: 10.7 10*3/uL — ABNORMAL HIGH (ref 4.0–10.5)

## 2017-11-24 LAB — MAGNESIUM: Magnesium: 1.7 mg/dL (ref 1.7–2.4)

## 2017-11-24 LAB — BASIC METABOLIC PANEL WITH GFR
Anion gap: 9 (ref 5–15)
BUN: 25 mg/dL — ABNORMAL HIGH (ref 6–20)
CO2: 23 mmol/L (ref 22–32)
Calcium: 8.4 mg/dL — ABNORMAL LOW (ref 8.9–10.3)
Chloride: 101 mmol/L (ref 101–111)
Creatinine, Ser: 2.34 mg/dL — ABNORMAL HIGH (ref 0.61–1.24)
GFR calc Af Amer: 32 mL/min — ABNORMAL LOW (ref >60)
GFR calc non Af Amer: 27 mL/min — ABNORMAL LOW (ref >60)
Glucose, Bld: 98 mg/dL (ref 65–99)
Potassium: 3.7 mmol/L (ref 3.5–5.1)
Sodium: 133 mmol/L — ABNORMAL LOW (ref 135–145)

## 2017-11-24 LAB — PREPARE FRESH FROZEN PLASMA
UNIT DIVISION: 0
Unit division: 0

## 2017-11-24 LAB — PROTIME-INR
INR: 2.1
Prothrombin Time: 23.4 s — ABNORMAL HIGH (ref 11.4–15.2)

## 2017-11-24 LAB — PHOSPHORUS: Phosphorus: 5.1 mg/dL — ABNORMAL HIGH (ref 2.5–4.6)

## 2017-11-24 MED ORDER — FUROSEMIDE 10 MG/ML IJ SOLN
60.0000 mg | Freq: Once | INTRAMUSCULAR | Status: AC
  Administered 2017-11-24: 60 mg via INTRAVENOUS
  Filled 2017-11-24: qty 6

## 2017-11-24 MED ORDER — DIPHENHYDRAMINE HCL 12.5 MG/5ML PO ELIX
12.5000 mg | ORAL_SOLUTION | Freq: Four times a day (QID) | ORAL | Status: DC | PRN
Start: 2017-11-24 — End: 2017-12-06
  Administered 2017-11-24 – 2017-11-27 (×3): 12.5 mg via ORAL
  Filled 2017-11-24 (×4): qty 5

## 2017-11-24 MED ORDER — VITAMIN K1 10 MG/ML IJ SOLN
5.0000 mg | Freq: Once | INTRAVENOUS | Status: AC
  Administered 2017-11-24: 5 mg via INTRAVENOUS
  Filled 2017-11-24: qty 0.5

## 2017-11-24 MED ORDER — SODIUM CHLORIDE 0.9 % IV SOLN
Freq: Once | INTRAVENOUS | Status: AC
  Administered 2017-11-24: 14:00:00 via INTRAVENOUS

## 2017-11-24 NOTE — Evaluation (Signed)
Physical Therapy Evaluation Patient Details Name: Logan Patrick MRN: [id number] DOB: May 19, 1951 Today's Date: 11/24/2017   History of Present Illness  Pt is a 67 y.o. male admitted 11/21/17 after falling at work leading to L hip pain; sufferred a syncopal vs seizure in ED that resolved with IVF resucitation. MRI shows no acute intracranial abnormality. Transferred to ICU for worsening hypotension and decreased Hgb. Found to have significant L thigh hematoma resulting in shock. Pt also with hepatic failure in setting of alcoholic cirrhosis. PMH includes liver failure, HTN.     Clinical Impression  Pt presents with an overall decrease in functional mobility secondary to above. PTA, pt indep, works full-time, and lives alone; pt reports children live nearby and available for PRN assist. Per MD, pt ok to transfer to chair with PT today. Able to do so with HHA and minA to stand; took steps with min guard for balance. VSS throughout (see below). Expect pt to progress well with mobility. Pt would benefit from continued acute PT services to maximize functional mobility and independence prior to d/c with HHPT services.  SpO2 97% on RA Supine BP 111/65, sitting BP 117/77, post-transfer BP 120/61 HR up to 120s    Follow Up Recommendations Home health PT;Supervision - Intermittent    Equipment Recommendations  (TBD)    Recommendations for Other Services OT consult     Precautions / Restrictions Precautions Precautions: Fall Precaution Comments: L thigh hematoma Restrictions Weight Bearing Restrictions: No      Mobility  Bed Mobility Overal bed mobility: Needs Assistance Bed Mobility: Supine to Sit     Supine to sit: Min assist;HOB elevated     General bed mobility comments: Increased time and effort; minA for UE support to assist scooting to EOB. Pt limited by significant swelling in BLEs  Transfers Overall transfer level: Needs assistance Equipment used: 1 person hand held  assist Transfers: Sit to/from Stand Sit to Stand: Min assist         General transfer comment: MinA and HHA to stand at EOB; c/o lightheadedness which subsided with seated rest, VSS  Ambulation/Gait Ambulation/Gait assistance: Min guard Ambulation Distance (Feet): 3 Feet Assistive device: None Gait Pattern/deviations: Step-to pattern;Wide base of support Gait velocity: Decreased Gait velocity interpretation: <1.8 ft/sec, indicate of risk for recurrent falls General Gait Details: Slow, waddling steps due to significant BLE edema from bed to recliner; min guard for balance  Stairs            Wheelchair Mobility    Modified Rankin (Stroke Patients Only)       Balance Overall balance assessment: Needs assistance   Sitting balance-Leahy Scale: Fair       Standing balance-Leahy Scale: Fair                               Pertinent Vitals/Pain Pain Assessment: Faces Faces Pain Scale: Hurts a little bit Pain Location: L thigh Pain Descriptors / Indicators: Sore Pain Intervention(s): Limited activity within patient's tolerance;Monitored during session    Home Living Family/patient expects to be discharged to:: Private residence Living Arrangements: Alone Available Help at Discharge: Family;Friend(s);Available PRN/intermittently Type of Home: House Home Access: Stairs to enter Entrance Stairs-Rails: Psychiatric nurse of Steps: 2 Home Layout: One level Home Equipment: None Additional Comments: Ex-wife and children live nearby; pt reports they can assist if needed    Prior Function Level of Independence: Independent  Comments: Works full-time in Public affairs consultant        Extremity/Trunk Assessment   Upper Extremity Assessment Upper Extremity Assessment: Overall WFL for tasks assessed    Lower Extremity Assessment Lower Extremity Assessment: RLE deficits/detail;LLE deficits/detail RLE Deficits /  Details: edematous; strength grossly 4/5 RLE Coordination: decreased fine motor;decreased gross motor LLE Deficits / Details: edematous; strength grossly 4/5 LLE Coordination: decreased fine motor;decreased gross motor    Cervical / Trunk Assessment Cervical / Trunk Assessment: Normal  Communication   Communication: No difficulties  Cognition Arousal/Alertness: Awake/alert Behavior During Therapy: WFL for tasks assessed/performed Overall Cognitive Status: Within Functional Limits for tasks assessed                                        General Comments      Exercises     Assessment/Plan    PT Assessment Patient needs continued PT services  PT Problem List Decreased strength;Decreased activity tolerance;Decreased balance;Decreased mobility;Decreased knowledge of use of DME       PT Treatment Interventions DME instruction;Gait training;Stair training;Functional mobility training;Therapeutic activities;Therapeutic exercise;Balance training;Patient/family education    PT Goals (Current goals can be found in the Care Plan section)  Acute Rehab PT Goals Patient Stated Goal: Return home and back to work PT Goal Formulation: With patient Time For Goal Achievement: 12/08/17 Potential to Achieve Goals: Good    Frequency Min 3X/week   Barriers to discharge        Co-evaluation               AM-PAC PT "6 Clicks" Daily Activity  Outcome Measure Difficulty turning over in bed (including adjusting bedclothes, sheets and blankets)?: A Little Difficulty moving from lying on back to sitting on the side of the bed? : Unable Difficulty sitting down on and standing up from a chair with arms (e.g., wheelchair, bedside commode, etc,.)?: Unable Help needed moving to and from a bed to chair (including a wheelchair)?: A Little Help needed walking in hospital room?: A Little Help needed climbing 3-5 steps with a railing? : A Lot 6 Click Score: 13    End of Session  Equipment Utilized During Treatment: Gait belt Activity Tolerance: Patient tolerated treatment well Patient left: in chair;with call bell/phone within reach Nurse Communication: Mobility status PT Visit Diagnosis: Other abnormalities of gait and mobility (R26.89)    Time: 6578-4696 PT Time Calculation (min) (ACUTE ONLY): 25 min   Charges:   PT Evaluation $PT Eval Moderate Complexity: 1 Mod PT Treatments $Therapeutic Activity: 8-22 mins   PT G Codes:       Mabeline Caras, PT, DPT Acute Rehab Services  Pager: [phone] 11/24/2017, 10:10 AM

## 2017-11-24 NOTE — Progress Notes (Signed)
PULMONARY / CRITICAL CARE MEDICINE   Name: Logan Patrick MRN: [id number] DOB: [birth year]    ADMISSION DATE:  11/21/2017 CONSULTATION DATE:  4/30  Interval Hx: Remains in the ICU feeling better. Urine output picking up and clearing. Denies any shortness of breath or chest pain. Leg pain has improved.   REFERRING MD:  Dr. 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  4/28 while at work he tripped and fell to the ground where he laid for about 20 minutes before being found.  Initially he was able to ambulate without  much pain, but the following day, pain worsened and his L hip became discolored causing him to present to the emergency department. He suffered a syncopal event versus seizure in ED, but resolved and felt better with IVF resuscitation.   He was admitted to the hospitalist service for syncope and hypotension.  4/30 he developed worsening hypotension. HGB checked and was acutely decreased. He was given vit K, FFP, and TXA in preparation for blood product administration. PCCM consulted for ICU transfer.  Marland Kitchen  VITAL SIGNS: BP (!) 119/59   Pulse (!) 110   Temp 98.1 F (36.7 C)   Resp 17   Ht 5\' 10"  (1.778 m)   Wt 131.6 kg (290 lb 2 oz)   SpO2 96%   BMI 41.63 kg/m   HEMODYNAMICS:    VENTILATOR SETTINGS:    INTAKE / OUTPUT: I/O last 3 completed shifts: In: 3614.5 [P.O.:1680; Blood:1884.5; IV Piggyback:50] Out: 989 [Urine:875]  PHYSICAL EXAMINATION: General: Jaundiced male in no acute distress on room air HEENT: Sclerae jaundice, no JVD or lymphadenopathy is appreciated Neuro: Intact follows commands CV: Heart sounds are regular rate rate rhythm  PULM: Decreased breath sounds in the bases QJ:JHER, non-tender, bsx4 active  GU: Reports pink-colored urine Extremities: Bilateral  lower extremity edema is noted 3+, left thigh hematoma is marked and stable  Skin: Jaundiced throughout    LABS:  BMET Recent Labs  Lab 11/22/17 1128 11/23/17 0603 11/24/17 0404  NA 137 134* 133*  K 3.9 4.0 3.7  CL 108 102 101  CO2 20* 20* 23  BUN 12 17 25*  CREATININE 1.56* 1.95* 2.34*  GLUCOSE 151* 111* 98    Electrolytes Recent Labs  Lab 11/22/17 1128 11/23/17 0603 11/24/17 0404  CALCIUM 8.1* 8.2* 8.4*  MG 1.2* 1.6* 1.7  PHOS  --   --  5.1*    CBC Recent Labs  Lab 11/23/17 0603 11/23/17 1555 11/24/17 0404  WBC 17.5* 13.7* 12.3*  HGB 6.8* 7.2* 7.2*  HCT 20.2* 21.1* 21.4*  PLT 88* 78* 69*    Coag's Recent Labs  Lab 11/22/17 1333 11/22/17 1842 11/23/17 0603 11/24/17 0404  APTT 50*  --   --  44*  INR  --  2.10 2.15 2.10    Sepsis Markers Recent Labs  Lab 11/21/17 1845  LATICACIDVEN 3.23*    ABG No results for input(s): PHART, PCO2ART, PO2ART in the last 168 hours.  Liver Enzymes Recent Labs  Lab 11/21/17 1530 11/22/17 1128  AST 81* 61*  ALT 35 31  ALKPHOS 143* 109  BILITOT 10.8* 9.6*  ALBUMIN 2.2* 1.8*    Cardiac Enzymes Recent Labs  Lab 11/21/17 1532 11/21/17 2333  TROPONINI 0.03* <0.03    Glucose Recent Labs  Lab 11/21/17 1529  GLUCAP  138*    Imaging No results found.  STUDIES:  CT head 4/29 > No acute intracranial abnormality. CT abdomen/pelvis 4/29 > Mild fluid/ascites along the pancreatic tail, nonspecific. Mild acute pancreatitis is not excluded. However, this may also be related to the patient's abdominopelvic ascites. Cirrhosis. Portal vein is diminutive but patent. Dominant splenorenal shunt. Small volume abdominopelvic ascites. Cholelithiasis, without associated inflammatory changes.Trace left pleural effusion. DG hip L 4/30 > No fracture or dislocation. There is slight symmetric narrowing of both hip joints.  CULTURES:  ANTIBIOTICS:   SIGNIFICANT EVENTS: 4/29 admit 4/30 transfer to ICU 11/23/2017  transfusion  LINES/TUBES:   DISCUSSION: 67 year old male with alcoholic cirrhosis. He fell at work and presented to ED with syncope. Improved with volume. No hip fracture, but large L hip hematoma. Complicated by shock. Now transferring to ICU for transfusion.  11/23/2017 hemodynamic stable not requiring pressors but he does require transfusion.  Continue to monitor in the intensive care unit.  ASSESSMENT / PLAN:  Shock: Secondary to Left thigh hematoma r/t fall. He is coagulopathic and thrombocytopenic in the setting of hepatic failure/cirrhosis. Distal pulses intact.  -improved and BP stable  -Monitor chronic H&H -Monitor coagulation studies -Transfuse as needed hemoglobin is stable for now -Orthopedics managing thigh hematoma  Hepatic failure in the setting of alcoholic cirrhosis: Does not seems to be encephalopathic.  -Follow LFTs  Acute renal failure Lab Results  Component Value Date   CREATININE 2.34 (H) 11/24/2017   CREATININE 1.95 (H) 11/23/2017   CREATININE 1.56 (H) 11/22/2017   Recent Labs  Lab 11/22/17 1128 11/23/17 0603 11/24/17 0404  K 3.9 4.0 3.7      -Shock may be a component - I will give a dose of lasix 60mg  stat for his generalized anasarca  -Continue to monitor  Hypomagnesemia -replace   Seizure vs syncope in ED. MRI brain non-acute. Suspect this is syncope due to anemia.  -EEG not specific  -Carotid Doppler studies are pending   Richardson Landry Minor ACNP Maryanna Shape PCCM Pager [phone] till 1 pm If no answer page [phone] 11/24/2017, 8:57 AM

## 2017-11-24 NOTE — Progress Notes (Signed)
@IPLOG @        PROGRESS NOTE                                                                                                                                                                                                             Patient Demographics:    Logan Patrick, is a 67 y.o. male, DOB - 06-06-1951, AJG:[id number]  Admit date - 11/21/2017   Admitting Physician Jani Gravel, MD  Outpatient Primary MD for the patient is [name], MD  LOS - 3  Chief Complaint  Patient presents with  . Fall       Brief Narrative  - [name]  is a 67 y.o. male, w Etoh cirrhosis , hypertension who presents with c/o bending over at work and feeling light headed like he was going to have syncope and lost his  Balance.  Pt was brought into ED by ex- wife, In ED, pt apparently had episode of syncope,    Subjective:    Jahrel Borthwick continues to have severe thigh swelling   Assessment  & Plan :    1.  Massive left thigh hematoma/hemorrhagic shock -Following mechanical fall, worsened by auto anticoagulation and chronic, cytopenia from underlying cirrhosis -Status post 6 units of FFP, vitamin K and 4 units of PRBC so far -PCCM following -Orthopedics Dr. Marcelino Scot consulted as well, recommended to treat coagulopathy, he felt that surgical evacuation would precipitate further blood loss into negative pressure vacuum -case d/w VVS Dr.Brabham by Dr.Singh 4/30 -PT/INR still elevated will give 2 more units of FFP today, and another dose of vitamin K -Elevated left leg as possible -Lasix  again x1 now   2. Acute blood loss anemia -Secondary to massive left thigh hematoma as noted above -Status post 4 PRBCs so far, hemoglobin is only 7.2 this morning -Monitor CBC every 12  3.  Alcoholic cirrhosis with auto anticoagulation and ascites along with chronic thrombocytopenia.  Quit alcohol several years ago, supportive care - Continue oral Xifaxan and lactulose for now.   3.  Syncope  -Likely  secondary to blood loss anemia and orthostatic hypotension -MRI brain was done on admission which is unrevealing -EEG noted diffuse slowing  4.  Hypotension- due to blood loss related anemia,  - transfuse -blood pressure improved  5. ARF - due to #1 and #4 - transfuse, avoid hypotension and nephrotoxins, monitor. -volume expansion with FFP today  6. Hypomagnesemia - replaced, monitor.  DVT prophylaxis: SCDs  Diet : Heart Healthy  Family Communication  :  None  Code Status : Full  Disposition Plan  : keep in ICU today  Consults  : Orthopedics, PCCM  Procedures  :      DVT Prophylaxis  : None for now  Lab Results  Component Value Date   INR 2.10 11/24/2017   INR 2.15 11/23/2017   INR 2.10 11/22/2017     Lab Results  Component Value Date   PLT 69 (L) 11/24/2017    Inpatient Medications  Scheduled Meds: . feeding supplement (PRO-STAT SUGAR FREE 64)  30 mL Oral BID  . lactulose  30 g Oral Daily  . pantoprazole  40 mg Oral BID  . rifaximin  550 mg Oral BID   Continuous Infusions: . sodium chloride     PRN Meds:.diphenhydrAMINE, iopamidol, ipratropium-albuterol, morphine injection, ondansetron (ZOFRAN) IV, promethazine, sodium chloride, sodium chloride flush  Antibiotics  :    Anti-infectives (From admission, onward)   Start     Dose/Rate Route Frequency Ordered Stop   11/21/17 2315  rifaximin (XIFAXAN) tablet 550 mg     550 mg Oral 2 times daily 11/21/17 2303     11/21/17 1745  cefTRIAXone (ROCEPHIN) 2 g in sodium chloride 0.9 % 100 mL IVPB     2 g 200 mL/hr over 30 Minutes Intravenous  Once 11/21/17 1732 11/21/17 1901         Objective:   Vitals:   11/24/17 0830 11/24/17 0900 11/24/17 0914 11/24/17 1159  BP:  120/61    Pulse: (!) 110 (!) 118    Resp: 17 17    Temp: 98.1 F (36.7 C) 98.4 F (36.9 C) 98.3 F (36.8 C) 97.9 F (36.6 C)  TempSrc:   Oral Oral  SpO2: 96% 98%    Weight:      Height:        Wt Readings from Last 3 Encounters:   11/24/17 131.6 kg (290 lb 2 oz)  12/12/12 86.3 kg (190 lb 4.1 oz)     Intake/Output Summary (Last 24 hours) at 11/24/2017 1208 Last data filed at 11/24/2017 0800 Gross per 24 hour  Intake 1911 ml  Output 850 ml  Net 1061 ml     Physical Exam  Gen: Awake, Alert, Oriented X 3, no distress HEENT: PERRLA, Neck supple, no JVD Lungs: diminished breath sounds at both bases CVS: S1-S2/Abd: soft, Non tender, non distended, BS present Extremities: massive left thigh hematoma with bluish/purplish discoloration of upper thigh, 2+ edema in lower leg distal pulses, sensations and capillary refill intact Skin: as above    Data Review:    CBC Recent Labs  Lab 11/21/17 1530  11/22/17 1842 11/23/17 0023 11/23/17 0603 11/23/17 1555 11/24/17 0404  WBC 11.5*   < > 15.1* 17.6* 17.5* 13.7* 12.3*  HGB 10.3*   < > 7.1* 7.0* 6.8* 7.2* 7.2*  HCT 30.3*   < > 21.1* 20.4* 20.2* 21.1* 21.4*  PLT 132*   < > 88* 90* 88* 78* 69*  MCV 105.6*   < > 99.1 98.1 98.5 94.6 95.5  MCH 35.9*   < > 33.3 33.7 33.2 32.3 32.1  MCHC 34.0   < > 33.6 34.3 33.7 34.1 33.6  RDW 17.4*   < > 20.1* 21.1* 21.5* 20.1* 20.5*  LYMPHSABS 1.6  --   --   --   --   --   --   MONOABS 0.9  --   --   --   --   --   --   EOSABS  0.1  --   --   --   --   --   --   BASOSABS 0.1  --   --   --   --   --   --    < > = values in this interval not displayed.    Chemistries  Recent Labs  Lab 11/21/17 1530 11/22/17 1128 11/23/17 0603 11/24/17 0404  NA 136 137 134* 133*  K 3.2* 3.9 4.0 3.7  CL 107 108 102 101  CO2 18* 20* 20* 23  GLUCOSE 150* 151* 111* 98  BUN 10 12 17  25*  CREATININE 0.99 1.56* 1.95* 2.34*  CALCIUM 8.4* 8.1* 8.2* 8.4*  MG  --  1.2* 1.6* 1.7  AST 81* 61*  --   --   ALT 35 31  --   --   ALKPHOS 143* 109  --   --   BILITOT 10.8* 9.6*  --   --    ------------------------------------------------------------------------------------------------------------------ No results for input(s): CHOL, HDL, LDLCALC, TRIG,  CHOLHDL, LDLDIRECT in the last 72 hours.  No results found for: HGBA1C ------------------------------------------------------------------------------------------------------------------ Recent Labs    11/21/17 2333  TSH 10.460*   ------------------------------------------------------------------------------------------------------------------ No results for input(s): VITAMINB12, FOLATE, FERRITIN, TIBC, IRON, RETICCTPCT in the last 72 hours.  Coagulation profile Recent Labs  Lab 11/21/17 1530 11/22/17 1842 11/23/17 0603 11/24/17 0404  INR 2.75 2.10 2.15 2.10    No results for input(s): DDIMER in the last 72 hours.  Cardiac Enzymes Recent Labs  Lab 11/21/17 1532 11/21/17 2333  CKMB  --  5.7*  TROPONINI 0.03* <0.03   ------------------------------------------------------------------------------------------------------------------ No results found for: BNP  Micro Results Recent Results (from the past 240 hour(s))  Culture, blood (routine x 2)     Status: None (Preliminary result)   Collection Time: 11/21/17  3:20 PM  Result Value Ref Range Status   Specimen Description   Final    BLOOD LEFT ANTECUBITAL Performed at [facility], [address] [place], [place] [address]    Special Requests   Final    BOTTLES DRAWN AEROBIC AND ANAEROBIC Blood Culture adequate volume Performed at [facility], [address] [place], [place] [address]    Culture   Final    NO GROWTH 2 DAYS Performed at [facility], [place] [address] [place], [place] [address]    Report Status PENDING  Incomplete  Culture, blood (routine x 2)     Status: None (Preliminary result)   Collection Time: 11/21/17  3:40 PM  Result Value Ref Range Status   Specimen Description   Final    BLOOD LEFT HAND Performed at [facility], [address] [place], [place] [address]    Special Requests   Final    BOTTLES DRAWN AEROBIC AND ANAEROBIC Blood Culture adequate  volume Performed at [facility], [address] [place], [place] [address]    Culture   Final    NO GROWTH 2 DAYS Performed at [facility], [place] [address] [place], [place] [address]    Report Status PENDING  Incomplete  MRSA PCR Screening     Status: None   Collection Time: 11/22/17  3:34 PM  Result Value Ref Range Status   MRSA by PCR NEGATIVE NEGATIVE Final    Comment:        The GeneXpert MRSA Assay (FDA approved for NASAL specimens only), is one component of a comprehensive MRSA  colonization surveillance program. It is not intended to diagnose MRSA infection nor to guide or monitor treatment for MRSA infections. Performed at [facility], [place] [address] [place], [place] [address]     Radiology Reports Dg Chest 1 View  Result Date: 11/21/2017 CLINICAL DATA:  Near syncope with jaundice EXAM: CHEST  1 VIEW COMPARISON:  10/11/2017 FINDINGS: Possible trace left effusion. No focal consolidation. Stable cardiomediastinal silhouette with mild vascular congestion. Mild diffuse interstitial opacity compared to prior. No pneumothorax. IMPRESSION: 1. Probable tiny left effusion 2. Minimal vascular congestion. Mild diffuse increased interstitial opacity compared to prior, possible mild edema. Electronically Signed   By: Donavan Foil M.D.   On: 11/21/2017 18:38   Ct Head Wo Contrast  Result Date: 11/21/2017 CLINICAL DATA:  Altered mental status and seizure. EXAM: CT HEAD WITHOUT CONTRAST TECHNIQUE: Contiguous axial images were obtained from the base of the skull through the vertex without intravenous contrast. COMPARISON:  CT head dated Dec 11, 2012. FINDINGS: Brain: No evidence of acute infarction, hemorrhage, hydrocephalus, extra-axial collection or mass lesion/mass effect. Stable cerebral atrophy and chronic microvascular ischemic changes. Vascular: No hyperdense vessel or unexpected calcification. Skull: Normal. Negative for fracture or focal lesion.  Sinuses/Orbits: No acute finding. Other: None. IMPRESSION: 1.  No acute intracranial abnormality. Electronically Signed   By: Titus Dubin M.D.   On: 11/21/2017 16:02   Mr Brain Wo Contrast  Result Date: 11/22/2017 CLINICAL DATA:  Altered level of consciousness. Liver failure cirrhosis EXAM: MRI HEAD WITHOUT CONTRAST TECHNIQUE: Multiplanar, multiecho pulse sequences of the brain and surrounding structures were obtained without intravenous contrast. COMPARISON:  CT head 11/21/2017 FINDINGS: Brain: Moderate atrophy. Ventricular enlargement consistent with atrophy. Negative for acute infarct. Minimal chronic changes in the white matter. Negative for hemorrhage, mass, or fluid collection Vascular: Normal arterial flow voids Skull and upper cervical spine: Negative Sinuses/Orbits: Negative Other: None IMPRESSION: Moderate atrophy.  No acute intracranial abnormality. Electronically Signed   By: Franchot Gallo M.D.   On: 11/22/2017 10:39   Ct Abdomen Pelvis W Contrast  Result Date: 11/21/2017 CLINICAL DATA:  Near syncope, jaundice, fall. History of liver failure. EXAM: CT ABDOMEN AND PELVIS WITH CONTRAST TECHNIQUE: Multidetector CT imaging of the abdomen and pelvis was performed using the standard protocol following bolus administration of intravenous contrast. CONTRAST:  175mL ISOVUE-300 IOPAMIDOL (ISOVUE-300) INJECTION 61% COMPARISON:  Right upper quadrant ultrasound dated 09/30/2017 FINDINGS: Lower chest: Trace left pleural effusion. Hepatobiliary: Cirrhosis.  No focal hepatic lesion is seen. Cholelithiasis, without associated inflammatory changes. No intrahepatic or extrahepatic ductal dilatation. Pancreas: Mild fluid/ascites along the pancreatic tail (series 2/image 30), nonspecific. No pancreatic ductal dilatation or atrophy. Spleen: Spleen is normal in size. Adrenals/Urinary Tract: Adrenal glands are within normal limits. Kidneys are within normal limits. No hydronephrosis. Bladder is underdistended but  unremarkable. Stomach/Bowel: Stomach is within normal limits. No evidence of bowel obstruction. Normal appendix (series 2/image 45). Mild sigmoid diverticulosis, without evidence of diverticulitis. Vascular/Lymphatic: No evidence of abdominal aortic aneurysm. Atherosclerotic calcifications of the abdominal aorta and branch vessels. Dominant splenorenal shunt.  Diminutive portal vein, patent. No suspicious abdominopelvic lymphadenopathy. Reproductive: Prostate is unremarkable. Other: Small volume abdominopelvic ascites, predominantly perihepatic and perisplenic. Fat/fluid in the left inguinal canal. Musculoskeletal: Visualized osseous structures are within normal limits. IMPRESSION: Mild fluid/ascites along the pancreatic tail, nonspecific. Mild acute pancreatitis is not excluded. However, this may also be related to the patient's abdominopelvic ascites. Cirrhosis. Portal vein is diminutive but patent. Dominant splenorenal shunt. Small volume abdominopelvic ascites.  Cholelithiasis, without associated inflammatory changes. Trace left pleural effusion. Electronically Signed   By: Julian Hy M.D.   On: 11/21/2017 18:40   Dg Chest Port 1 View  Result Date: 11/24/2017 CLINICAL DATA:  Respiratory failure, shortness of breath, cirrhosis, former smoker. EXAM: PORTABLE CHEST 1 VIEW COMPARISON:  Portable chest x-ray of Nov 24, 2017 FINDINGS: The lungs are mildly hypoinflated. The lung markings are coarse in the infrahilar regions. There is no alveolar infiltrate or pleural effusion. The heart is top-normal in size. The pulmonary vascularity is normal. The bony thorax exhibits no acute abnormality. IMPRESSION: Interval improvement in the appearance of the pulmonary interstitium suggests decreased interstitial edema. No alveolar pneumonia nor significant pleural effusion. Electronically Signed   By: David  Martinique M.D.   On: 11/24/2017 09:13   Dg Hip Unilat With Pelvis 2-3 Views Left  Result Date: 11/21/2017 CLINICAL  DATA:  Pain following fall EXAM: DG HIP (WITH OR WITHOUT PELVIS) 2-3V LEFT COMPARISON:  None. FINDINGS: Frontal pelvis as well as frontal and lateral left hip images were obtained. No fracture or dislocation. There is slight symmetric narrowing of both hip joints. No erosive change. IMPRESSION: No fracture or dislocation. There is slight symmetric narrowing of both hip joints. Electronically Signed   By: Lowella Grip III M.D.   On: 11/21/2017 14:59    Time Spent in minutes  30   Domenic Polite M.D on 11/24/2017 at 12:08 PM  Between 7am to 7pm - Page via [email or web address] - password [email or web address]

## 2017-11-25 DIAGNOSIS — D696 Thrombocytopenia, unspecified: Secondary | ICD-10-CM

## 2017-11-25 LAB — CBC
HEMATOCRIT: 25.6 % — AB (ref 39.0–52.0)
Hemoglobin: 8.7 g/dL — ABNORMAL LOW (ref 13.0–17.0)
MCH: 31.5 pg (ref 26.0–34.0)
MCHC: 34 g/dL (ref 30.0–36.0)
MCV: 92.8 fL (ref 78.0–100.0)
PLATELETS: 63 10*3/uL — AB (ref 150–400)
RBC: 2.76 MIL/uL — ABNORMAL LOW (ref 4.22–5.81)
RDW: 20.3 % — AB (ref 11.5–15.5)
WBC: 9.9 10*3/uL (ref 4.0–10.5)

## 2017-11-25 LAB — BPAM FFP
Blood Product Expiration Date: 201905052359
Blood Product Expiration Date: 201905052359
ISSUE DATE / TIME: 201905021431
ISSUE DATE / TIME: 201905021618
UNIT TYPE AND RH: 6200
Unit Type and Rh: 6200

## 2017-11-25 LAB — BASIC METABOLIC PANEL
Anion gap: 7 (ref 5–15)
BUN: 28 mg/dL — ABNORMAL HIGH (ref 6–20)
CALCIUM: 8.7 mg/dL — AB (ref 8.9–10.3)
CHLORIDE: 103 mmol/L (ref 101–111)
CO2: 25 mmol/L (ref 22–32)
CREATININE: 1.99 mg/dL — AB (ref 0.61–1.24)
GFR calc Af Amer: 39 mL/min — ABNORMAL LOW (ref >60)
GFR calc non Af Amer: 33 mL/min — ABNORMAL LOW (ref >60)
Glucose, Bld: 93 mg/dL (ref 65–99)
Potassium: 3.1 mmol/L — ABNORMAL LOW (ref 3.5–5.1)
SODIUM: 135 mmol/L (ref 135–145)

## 2017-11-25 LAB — CBC WITH DIFFERENTIAL/PLATELET
BASOS PCT: 0 %
Basophils Absolute: 0 10*3/uL (ref 0.0–0.1)
EOS ABS: 0.3 10*3/uL (ref 0.0–0.7)
EOS PCT: 4 %
HCT: 19.1 % — ABNORMAL LOW (ref 39.0–52.0)
HEMOGLOBIN: 6.6 g/dL — AB (ref 13.0–17.0)
LYMPHS ABS: 1.6 10*3/uL (ref 0.7–4.0)
Lymphocytes Relative: 20 %
MCH: 33.3 pg (ref 26.0–34.0)
MCHC: 34.6 g/dL (ref 30.0–36.0)
MCV: 96.5 fL (ref 78.0–100.0)
MONO ABS: 0.8 10*3/uL (ref 0.1–1.0)
Monocytes Relative: 10 %
Neutro Abs: 5.4 10*3/uL (ref 1.7–7.7)
Neutrophils Relative %: 66 %
Platelets: 66 10*3/uL — ABNORMAL LOW (ref 150–400)
RBC: 1.98 MIL/uL — ABNORMAL LOW (ref 4.22–5.81)
RDW: 19.5 % — AB (ref 11.5–15.5)
WBC: 8.1 10*3/uL (ref 4.0–10.5)

## 2017-11-25 LAB — PREPARE FRESH FROZEN PLASMA: Unit division: 0

## 2017-11-25 LAB — APTT: APTT: 44 s — AB (ref 24–36)

## 2017-11-25 LAB — HEMOGLOBIN AND HEMATOCRIT, BLOOD
HEMATOCRIT: 23.8 % — AB (ref 39.0–52.0)
Hemoglobin: 8 g/dL — ABNORMAL LOW (ref 13.0–17.0)

## 2017-11-25 LAB — PREPARE RBC (CROSSMATCH)

## 2017-11-25 LAB — PROTIME-INR
INR: 2.17
PROTHROMBIN TIME: 24 s — AB (ref 11.4–15.2)

## 2017-11-25 LAB — FIBRINOGEN: Fibrinogen: 122 mg/dL — ABNORMAL LOW (ref 210–475)

## 2017-11-25 MED ORDER — MORPHINE SULFATE (PF) 2 MG/ML IV SOLN
2.0000 mg | INTRAVENOUS | Status: DC | PRN
  Administered 2017-11-25: 1 mg via INTRAVENOUS
  Administered 2017-11-26 – 2017-11-30 (×14): 2 mg via INTRAVENOUS
  Filled 2017-11-25 (×15): qty 1

## 2017-11-25 MED ORDER — LACTULOSE 10 GM/15ML PO SOLN
30.0000 g | Freq: Two times a day (BID) | ORAL | Status: DC
  Administered 2017-11-25 – 2017-12-05 (×19): 30 g via ORAL
  Filled 2017-11-25 (×19): qty 45

## 2017-11-25 MED ORDER — TRAMADOL HCL 50 MG PO TABS
50.0000 mg | ORAL_TABLET | Freq: Once | ORAL | Status: AC
  Administered 2017-11-25: 50 mg via ORAL
  Filled 2017-11-25: qty 1

## 2017-11-25 MED ORDER — SODIUM CHLORIDE 0.9 % IV SOLN
Freq: Once | INTRAVENOUS | Status: AC
  Administered 2017-11-25: 17:00:00 via INTRAVENOUS

## 2017-11-25 MED ORDER — POTASSIUM CHLORIDE 20 MEQ/15ML (10%) PO SOLN: 20.0000 meq | Freq: Once | ORAL | Status: DC

## 2017-11-25 MED ORDER — SODIUM CHLORIDE 0.9 % IV SOLN
Freq: Once | INTRAVENOUS | Status: AC
  Administered 2017-11-25: 07:00:00 via INTRAVENOUS

## 2017-11-25 MED ORDER — POTASSIUM CHLORIDE 10 MEQ/50ML IV SOLN: 10.0000 meq | INTRAVENOUS | Status: DC

## 2017-11-25 MED ORDER — FUROSEMIDE 10 MG/ML IJ SOLN
40.0000 mg | Freq: Once | INTRAMUSCULAR | Status: AC
  Administered 2017-11-25: 40 mg via INTRAVENOUS
  Filled 2017-11-25: qty 4

## 2017-11-25 NOTE — Progress Notes (Signed)
OT Cancellation Note  Patient Details Name: CALEM COCOZZA MRN: [id number] DOB: March 04, 1951   Cancelled Treatment:    Reason Eval/Treat Not Completed: Medical issues which prohibited therapy(Hb is 6.6) Pt scheduled to get transfusion, OT will continue to follow as schedule allows.   [place] 11/25/2017, 8:38 AM  Hulda Humphrey OTR/L [phone]

## 2017-11-25 NOTE — Progress Notes (Signed)
Orthopedic Trauma Service Progress Note   Patient ID: Logan Patrick MRN: [id number] DOB/AGE: [birth year] 67 y.o.  Subjective:  Subjectively pt doing ok No complaints Sat in chair for several hours yesterday   Pt and nurse report that the swelling of proximal L thigh is improved compared to yesterday   Denies any new numbness or tingling   Review of Systems  Respiratory: Negative for shortness of breath.   Cardiovascular: Negative for chest pain and palpitations.  Neurological: Negative for tingling and sensory change.    Objective:   VITALS:   Vitals:   11/25/17 0705 11/25/17 0710 11/25/17 0800 11/25/17 0900  BP:  (!) 98/47 (!) 106/51 (!) 98/53  Pulse: (!) 103 100 (!) 108 96  Resp: 18 16 16 16   Temp: 99.3 F (37.4 C) 99.3 F (37.4 C) 99.1 F (37.3 C) 99 F (37.2 C)  TempSrc:      SpO2: 98% 93% 96% 93%  Weight:      Height:        Estimated body mass index is 40.49 kg/m as calculated from the following:   Height as of this encounter: 5\' 10"  (1.778 m).   Weight as of this encounter: 128 kg (282 lb 3 oz).   Intake/Output      05/02 0701 - 05/03 0700 05/03 0701 - 05/04 0700   P.O. 350    Blood 836    IV Piggyback 50    Total Intake(mL/kg) 1236 (9.7)    Urine (mL/kg/hr) 2935 (1) 350 (1)   Total Output 2935 350   Net -1699 -350          LABS  Results for orders placed or performed during the hospital encounter of 11/21/17 (from the past 24 hour(s))  Prepare fresh frozen plasma     Status: None   Collection Time: 11/24/17 12:16 PM  Result Value Ref Range   Unit Number [id number]    Blood Component Type THAWED PLASMA    Unit division 00    Status of Unit ISSUED,FINAL    Transfusion Status OK TO TRANSFUSE    Unit Number [id number]    Blood Component Type THW PLS APHR    Unit division A0    Status of Unit ISSUED,FINAL    Transfusion Status      OK TO TRANSFUSE Performed at [facility], [place] [address],  [place], [place] [address]   CBC     Status: Abnormal   Collection Time: 11/24/17  6:20 PM  Result Value Ref Range   WBC 10.7 (H) 4.0 - 10.5 K/uL   RBC 2.20 (L) 4.22 - 5.81 MIL/uL   Hemoglobin 7.2 (L) 13.0 - 17.0 g/dL   HCT 21.1 (L) 39.0 - 52.0 %   MCV 95.9 78.0 - 100.0 fL   MCH 32.7 26.0 - 34.0 pg   MCHC 34.1 30.0 - 36.0 g/dL   RDW 20.0 (H) 11.5 - 15.5 %   Platelets 71 (L) 150 - 400 K/uL  CBC with Differential/Platelet     Status: Abnormal   Collection Time: 11/25/17  4:45 AM  Result Value Ref Range   WBC 8.1 4.0 - 10.5 K/uL   RBC 1.98 (L) 4.22 - 5.81 MIL/uL   Hemoglobin 6.6 (LL) 13.0 - 17.0 g/dL   HCT 19.1 (L) 39.0 - 52.0 %   MCV 96.5 78.0 - 100.0 fL   MCH 33.3 26.0 - 34.0 pg   MCHC 34.6 30.0 - 36.0 g/dL   RDW 19.5 (H)  11.5 - 15.5 %   Platelets 66 (L) 150 - 400 K/uL   Neutrophils Relative % 66 %   Neutro Abs 5.4 1.7 - 7.7 K/uL   Lymphocytes Relative 20 %   Lymphs Abs 1.6 0.7 - 4.0 K/uL   Monocytes Relative 10 %   Monocytes Absolute 0.8 0.1 - 1.0 K/uL   Eosinophils Relative 4 %   Eosinophils Absolute 0.3 0.0 - 0.7 K/uL   Basophils Relative 0 %   Basophils Absolute 0.0 0.0 - 0.1 K/uL  Basic metabolic panel     Status: Abnormal   Collection Time: 11/25/17  4:45 AM  Result Value Ref Range   Sodium 135 135 - 145 mmol/L   Potassium 3.1 (L) 3.5 - 5.1 mmol/L   Chloride 103 101 - 111 mmol/L   CO2 25 22 - 32 mmol/L   Glucose, Bld 93 65 - 99 mg/dL   BUN 28 (H) 6 - 20 mg/dL   Creatinine, Ser 1.99 (H) 0.61 - 1.24 mg/dL   Calcium 8.7 (L) 8.9 - 10.3 mg/dL   GFR calc non Af Amer 33 (L) >60 mL/min   GFR calc Af Amer 39 (L) >60 mL/min   Anion gap 7 5 - 15  Protime-INR     Status: Abnormal   Collection Time: 11/25/17  4:45 AM  Result Value Ref Range   Prothrombin Time 24.0 (H) 11.4 - 15.2 seconds   INR 2.17   Prepare RBC     Status: None   Collection Time: 11/25/17  6:33 AM  Result Value Ref Range   Order Confirmation      ORDER PROCESSED BY BLOOD BANK Performed at [facility], [address], [place], [place] [address]   Prepare fresh frozen plasma     Status: None (Preliminary result)   Collection Time: 11/25/17  8:22 AM  Result Value Ref Range   Unit Number [id number]    Blood Component Type THAWED PLASMA    Unit division 00    Status of Unit ALLOCATED    Transfusion Status OK TO TRANSFUSE    Unit Number [id number]    Blood Component Type THW PLS APHR    Unit division B0    Status of Unit ALLOCATED    Transfusion Status OK TO TRANSFUSE    Unit Number [id number]    Blood Component Type THAWED PLASMA    Unit division 00    Status of Unit ALLOCATED    Transfusion Status      OK TO TRANSFUSE Performed at [facility], [place] [address], [place], [place] [address]    Unit Number [id number]    Blood Component Type THAWED PLASMA    Unit division 00    Status of Unit ALLOCATED    Transfusion Status OK TO TRANSFUSE   Prepare RBC     Status: None   Collection Time: 11/25/17  8:23 AM  Result Value Ref Range   Order Confirmation      ORDER PROCESSED BY BLOOD BANK Performed at [facility], [place] [address], [place], [place] [address]      PHYSICAL EXAM:   Gen: older than stated age, sitting up in bed, NAD  Pelvis: foley in place  Moderate scrotal edema  Ext:       Left Lower Extremity   Significant swelling/hematoma L proximal thigh   No blanching of soft tissue. Soft tissue still appears to have adequate blood supply   Significant ecchymosis as well  Soft tissue feels soft   Abrasion to anterolateral thigh stable, covered with foam dressing   + pitting edema distal leg  Distal motor and sensory functions grossly intact  + DP pulse     Assessment/Plan:     Principal Problem:   Syncope, near Active Problems:   Anemia   Hypokalemia   Hypotension   Tachycardia   Thrombocytopenia (HCC)   Seizures (HCC)   Elevated troponin   Syncope   Hemorrhagic shock (HCC)   AKI (acute kidney injury) ([place])   Thigh  hematoma, left, initial encounter   Acute blood loss anemia   Hematoma of left hip   Anti-infectives (From admission, onward)   Start     Dose/Rate Route Frequency Ordered Stop   11/21/17 2315  rifaximin (XIFAXAN) tablet 550 mg     550 mg Oral 2 times daily 11/21/17 2303     11/21/17 1745  cefTRIAXone (ROCEPHIN) 2 g in sodium chloride 0.9 % 100 mL IVPB     2 g 200 mL/hr over 30 Minutes Intravenous  Once 11/21/17 1732 11/21/17 1901    .  POD/HD#: 85  67 y/o male s/p fall   - fall  -L thigh hematoma  Reportedly improving  Continue with supportive care  Ok to be up with assistance  WBAT B LEx  Continue with coagulopathy correction   Did discuss with pt that we will need to watch his soft tissue closely for signs of necrosis  Currently skin/soft tissue is stable and still appears viable  Should issues arise with his soft tissue, would likely obtain plastics consult     As pt is continuing to need products may want to consider hematology consult for additional recommendations   - Dispo:  Per medical service  Will see next week     Jari Pigg, PA-C [facility] [phone] (P) [phone] Levi Aland (C) 11/25/2017, 9:52 AM

## 2017-11-25 NOTE — Progress Notes (Addendum)
PULMONARY / CRITICAL CARE MEDICINE   Name: Logan Patrick MRN: [id number] DOB: 04/07/1951    ADMISSION DATE:  11/21/2017 CONSULTATION DATE:  4/30  Interval Hx: Patient is feeling good today. Diuresing well with improvement in his edema. Denies shortness of breath. He does feel that his leg is less tense. Being transfused with blood   REFERRING MD:  Dr. 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  4/28 while at work he tripped and fell to the ground where he laid for about 20 minutes before being found.  Initially he was able to ambulate without  much pain, but the following day, pain worsened and his L hip became discolored causing him to present to the emergency department. He suffered a syncopal event versus seizure in ED, but resolved and felt better with IVF resuscitation.   He was admitted to the hospitalist service for syncope and hypotension.  4/30 he developed worsening hypotension. HGB checked and was acutely decreased. He was given vit K, FFP, and TXA in preparation for blood product administration. PCCM consulted for ICU transfer.  Marland Kitchen  VITAL SIGNS: BP (!) 98/53   Pulse 96   Temp 99 F (37.2 C)   Resp 16   Ht 5\' 10"  (1.778 m)   Wt 128 kg (282 lb 3 oz)   SpO2 93%   BMI 40.49 kg/m   HEMODYNAMICS:    VENTILATOR SETTINGS:    INTAKE / OUTPUT: I/O last 3 completed shifts: In: 1236 [P.O.:350; Blood:836; IV Piggyback:50] Out: 6269 [Urine:3565]  PHYSICAL EXAMINATION: General: Jaundiced male in no acute distress on room air HEENT: Sclerae jaundice, no JVD or lymphadenopathy is appreciated Neuro: Intact follows commands CV: Heart sounds are regular rate rate rhythm  PULM: Decreased breath sounds in the bases SW:NIOE, non-tender, bsx4 active  GU: Reports pink-colored  urine Extremities: Bilateral lower extremity edema is noted 3+, left thigh hematoma is marked and stable  Skin: Jaundiced throughout    LABS:  BMET Recent Labs  Lab 11/23/17 0603 11/24/17 0404 11/25/17 0445  NA 134* 133* 135  K 4.0 3.7 3.1*  CL 102 101 103  CO2 20* 23 25  BUN 17 25* 28*  CREATININE 1.95* 2.34* 1.99*  GLUCOSE 111* 98 93    Electrolytes Recent Labs  Lab 11/22/17 1128 11/23/17 0603 11/24/17 0404 11/25/17 0445  CALCIUM 8.1* 8.2* 8.4* 8.7*  MG 1.2* 1.6* 1.7  --   PHOS  --   --  5.1*  --     CBC Recent Labs  Lab 11/24/17 0404 11/24/17 1820 11/25/17 0445  WBC 12.3* 10.7* 8.1  HGB 7.2* 7.2* 6.6*  HCT 21.4* 21.1* 19.1*  PLT 69* 71* 66*    Coag's Recent Labs  Lab 11/22/17 1333  11/23/17 0603 11/24/17 0404 11/25/17 0445  APTT 50*  --   --  44*  --   INR  --    < > 2.15 2.10 2.17   < > = values in this interval not displayed.    Sepsis Markers Recent Labs  Lab 11/21/17 1845  LATICACIDVEN 3.23*    ABG No results for input(s): PHART, PCO2ART, PO2ART in the last 168 hours.  Liver Enzymes Recent Labs  Lab 11/21/17 1530 11/22/17 1128  AST 81* 61*  ALT 35 31  ALKPHOS 143* 109  BILITOT 10.8* 9.6*  ALBUMIN 2.2* 1.8*    Cardiac Enzymes Recent Labs  Lab 11/21/17 1532 11/21/17 2333  TROPONINI 0.03* <0.03    Glucose Recent Labs  Lab 11/21/17 1529  GLUCAP 138*    Imaging No results found.  STUDIES:  CT head 4/29 > No acute intracranial abnormality. CT abdomen/pelvis 4/29 > Mild fluid/ascites along the pancreatic tail, nonspecific. Mild acute pancreatitis is not excluded. However, this may also be related to the patient's abdominopelvic ascites. Cirrhosis. Portal vein is diminutive but patent. Dominant splenorenal shunt. Small volume abdominopelvic ascites. Cholelithiasis, without associated inflammatory changes.Trace left pleural effusion. DG hip L 4/30 > No fracture or dislocation. There is slight symmetric narrowing  of both hip joints.  CULTURES:  ANTIBIOTICS:   SIGNIFICANT EVENTS: 4/29 admit 4/30 transfer to ICU 11/23/2017 transfusion  LINES/TUBES:   DISCUSSION: 67 year old male with alcoholic cirrhosis. He fell at work and presented to ED with syncope. Improved with volume. No hip fracture, but large L hip hematoma. Complicated by shock. Now transferring to ICU for transfusion.  11/23/2017 hemodynamic stable not requiring pressors but he does require transfusion.  Continue to monitor in the intensive care unit.  ASSESSMENT / PLAN:  Shock: Secondary to Left thigh hematoma r/t fall. He is coagulopathic and thrombocytopenic in the setting of hepatic failure/cirrhosis. Distal pulses intact.  -improved and BP stable  -Monitor Hb -Monitor coagulation studies -Transfuse PRBC today  - lasix between PRBC  -Orthopedics managing thigh hematoma  Hepatic failure in the setting of alcoholic cirrhosis: Does not seems to be encephalopathic.  -Follow LFTs  Acute renal failure Lab Results  Component Value Date   CREATININE 1.99 (H) 11/25/2017   CREATININE 2.34 (H) 11/24/2017   CREATININE 1.95 (H) 11/23/2017   Recent Labs  Lab 11/23/17 0603 11/24/17 0404 11/25/17 0445  K 4.0 3.7 3.1*      -Shock may be a component -daily lasix  -Continue to monitor  Hypomagnesemia -replace   Seizure vs syncope in ED. MRI brain non-acute. Suspect this is syncope due to anemia.  -EEG not specific  -Carotid Doppler studies completed. Preliminary results. Technically difficult due to severe respiratory interference. 1% to 39% ICA stenosis. Vertebral artery flow is antegrade.  -  Stable to be transferred to regular floor

## 2017-11-25 NOTE — Progress Notes (Signed)
@IPLOG @        PROGRESS NOTE                                                                                                                                                                                                             Patient Demographics:    Logan Patrick, is a 67 y.o. male, DOB - July 21, 1951, NTZ:[id number]  Admit date - 11/21/2017   Admitting Physician Jani Gravel, MD  Outpatient Primary MD for the patient is [name], MD  LOS - 4  Chief Complaint  Patient presents with  . Fall       Brief Narrative  - Sesar Madewell  is a 67 y.o. male, w Etoh cirrhosis , hypertension who presents with c/o bending over at work and feeling light headed like he was going to have syncope and lost his  Balance.  Pt was brought into ED by ex- wife, In ED, pt apparently had episode of syncope,    Subjective:   -feels ok, swelling improving   Assessment  & Plan :    1.  Massive left thigh hematoma/hemorrhagic shock -Following mechanical fall, worsened by auto anticoagulation and chronic thrombocytopenia from underlying cirrhosis -Status post 8 units of FFP, vitamin K and 4 units of PRBC so far -PCCM following -Orthopedics Dr. Marcelino Scot consulted as well, recommended to treat coagulopathy, he felt that surgical evacuation would precipitate further blood loss into negative pressure vacuum -case d/w VVS Dr.Brabham by Dr.Singh 4/30 -PT/INR still elevated and HB is 6.6, will give 2 more units of PRBC and 4units of FFP with Lasix today -Elevated left leg as possible -TX out of ICU  2. Acute blood loss anemia -Secondary to massive left thigh hematoma as noted above -Status post 4 PRBCs so far, hemoglobin is 6.6, 2units PRBC today -Monitor CBC every 12  3.  Alcoholic cirrhosis with auto anticoagulation and ascites along with chronic thrombocytopenia.  Quit alcohol several years ago, supportive care - Continue oral Xifaxan and lactulose for now.   3.  Syncope  -Likely secondary to blood  loss anemia and orthostatic hypotension -MRI brain was done on admission which is unrevealing -EEG noted diffuse slowing  4.  Hypotension- due to blood loss related anemia,  - transfuse -blood pressure improved  5. ARF - due to #1 and #4 - transfuse, avoid hypotension and nephrotoxins, monitor. -volume expansion with FFP today  6. Hypomagnesemia - replaced, monitor.  DVT prophylaxis: SCDs  Diet : Heart Healthy  Family Communication  : None  Code  Status : Full  Disposition Plan  : Tx to SDU  Consults  : Orthopedics, PCCM  Procedures  :      DVT Prophylaxis  : None for now  Lab Results  Component Value Date   INR 2.17 11/25/2017   INR 2.10 11/24/2017   INR 2.15 11/23/2017     Lab Results  Component Value Date   PLT 66 (L) 11/25/2017    Inpatient Medications  Scheduled Meds: . feeding supplement (PRO-STAT SUGAR FREE 64)  30 mL Oral BID  . furosemide  40 mg Intravenous Once  . lactulose  30 g Oral BID  . pantoprazole  40 mg Oral BID  . rifaximin  550 mg Oral BID   Continuous Infusions: . sodium chloride    . sodium chloride     PRN Meds:.diphenhydrAMINE, iopamidol, ipratropium-albuterol, morphine injection, ondansetron (ZOFRAN) IV, promethazine, sodium chloride, sodium chloride flush  Antibiotics  :    Anti-infectives (From admission, onward)   Start     Dose/Rate Route Frequency Ordered Stop   11/21/17 2315  rifaximin (XIFAXAN) tablet 550 mg     550 mg Oral 2 times daily 11/21/17 2303     11/21/17 1745  cefTRIAXone (ROCEPHIN) 2 g in sodium chloride 0.9 % 100 mL IVPB     2 g 200 mL/hr over 30 Minutes Intravenous  Once 11/21/17 1732 11/21/17 1901         Objective:   Vitals:   11/25/17 0705 11/25/17 0710 11/25/17 0800 11/25/17 0900  BP:  (!) 98/47 (!) 106/51 (!) 98/53  Pulse: (!) 103 100 (!) 108 96  Resp: 18 16 16 16   Temp: 99.3 F (37.4 C) 99.3 F (37.4 C) 99.1 F (37.3 C) 99 F (37.2 C)  TempSrc:      SpO2: 98% 93% 96% 93%  Weight:       Height:        Wt Readings from Last 3 Encounters:  11/25/17 128 kg (282 lb 3 oz)  12/12/12 86.3 kg (190 lb 4.1 oz)     Intake/Output Summary (Last 24 hours) at 11/25/2017 1108 Last data filed at 11/25/2017 0800 Gross per 24 hour  Intake 886 ml  Output 3125 ml  Net -2239 ml     Physical Exam Gen: Awake, Alert, Oriented X 3, chronically ill appearing HEENT: PERRLA, Neck supple, no JVD Lungs: Good air movement bilaterally, CTAB CVS: RRR,No Gallops,Rubs or new Murmurs Abd: soft, Non tender, non distended, BS present Extremities: massive left thigh hematoma with bluish/purplish discoloration of upper thigh, 2+ edema in lower leg distal pulses, sensations and capillary refill intact Skin: as above    Data Review:    CBC Recent Labs  Lab 11/21/17 1530  11/23/17 0603 11/23/17 1555 11/24/17 0404 11/24/17 1820 11/25/17 0445  WBC 11.5*   < > 17.5* 13.7* 12.3* 10.7* 8.1  HGB 10.3*   < > 6.8* 7.2* 7.2* 7.2* 6.6*  HCT 30.3*   < > 20.2* 21.1* 21.4* 21.1* 19.1*  PLT 132*   < > 88* 78* 69* 71* 66*  MCV 105.6*   < > 98.5 94.6 95.5 95.9 96.5  MCH 35.9*   < > 33.2 32.3 32.1 32.7 33.3  MCHC 34.0   < > 33.7 34.1 33.6 34.1 34.6  RDW 17.4*   < > 21.5* 20.1* 20.5* 20.0* 19.5*  LYMPHSABS 1.6  --   --   --   --   --  1.6  MONOABS 0.9  --   --   --   --   --  0.8  EOSABS 0.1  --   --   --   --   --  0.3  BASOSABS 0.1  --   --   --   --   --  0.0   < > = values in this interval not displayed.    Chemistries  Recent Labs  Lab 11/21/17 1530 11/22/17 1128 11/23/17 0603 11/24/17 0404 11/25/17 0445  NA 136 137 134* 133* 135  K 3.2* 3.9 4.0 3.7 3.1*  CL 107 108 102 101 103  CO2 18* 20* 20* 23 25  GLUCOSE 150* 151* 111* 98 93  BUN 10 12 17  25* 28*  CREATININE 0.99 1.56* 1.95* 2.34* 1.99*  CALCIUM 8.4* 8.1* 8.2* 8.4* 8.7*  MG  --  1.2* 1.6* 1.7  --   AST 81* 61*  --   --   --   ALT 35 31  --   --   --   ALKPHOS 143* 109  --   --   --   BILITOT 10.8* 9.6*  --   --   --     ------------------------------------------------------------------------------------------------------------------ No results for input(s): CHOL, HDL, LDLCALC, TRIG, CHOLHDL, LDLDIRECT in the last 72 hours.  No results found for: HGBA1C ------------------------------------------------------------------------------------------------------------------ No results for input(s): TSH, T4TOTAL, T3FREE, THYROIDAB in the last 72 hours.  Invalid input(s): FREET3 ------------------------------------------------------------------------------------------------------------------ No results for input(s): VITAMINB12, FOLATE, FERRITIN, TIBC, IRON, RETICCTPCT in the last 72 hours.  Coagulation profile Recent Labs  Lab 11/21/17 1530 11/22/17 1842 11/23/17 0603 11/24/17 0404 11/25/17 0445  INR 2.75 2.10 2.15 2.10 2.17    No results for input(s): DDIMER in the last 72 hours.  Cardiac Enzymes Recent Labs  Lab 11/21/17 1532 11/21/17 2333  CKMB  --  5.7*  TROPONINI 0.03* <0.03   ------------------------------------------------------------------------------------------------------------------ No results found for: BNP  Micro Results Recent Results (from the past 240 hour(s))  Culture, blood (routine x 2)     Status: None (Preliminary result)   Collection Time: 11/21/17  3:20 PM  Result Value Ref Range Status   Specimen Description   Final    BLOOD LEFT ANTECUBITAL Performed at [facility], [address] [place], [place] [address]    Special Requests   Final    BOTTLES DRAWN AEROBIC AND ANAEROBIC Blood Culture adequate volume Performed at [facility], [address] [place], [place] [address]    Culture   Final    NO GROWTH 3 DAYS Performed at [facility], [place] [address] [place], [place] [address]    Report Status PENDING  Incomplete  Culture, blood (routine x 2)     Status: None (Preliminary result)   Collection Time: 11/21/17  3:40 PM  Result  Value Ref Range Status   Specimen Description   Final    BLOOD LEFT HAND Performed at [facility], [address] [place], [place] [address]    Special Requests   Final    BOTTLES DRAWN AEROBIC AND ANAEROBIC Blood Culture adequate volume Performed at [facility], [address] [place], [place] [address]    Culture   Final    NO GROWTH 3 DAYS Performed at [facility], [place] [address] [place],  [address]    Report Status PENDING  Incomplete  MRSA PCR Screening     Status: None   Collection Time: 11/22/17  3:34 PM  Result Value Ref Range Status   MRSA by  PCR NEGATIVE NEGATIVE Final    Comment:        The GeneXpert MRSA Assay (FDA approved for NASAL specimens only), is one component of a comprehensive MRSA colonization surveillance program. It is not intended to diagnose MRSA infection nor to guide or monitor treatment for MRSA infections. Performed at [facility], [place] [address] [place], [place] [address]     Radiology Reports Dg Chest 1 View  Result Date: 11/21/2017 CLINICAL DATA:  Near syncope with jaundice EXAM: CHEST  1 VIEW COMPARISON:  10/11/2017 FINDINGS: Possible trace left effusion. No focal consolidation. Stable cardiomediastinal silhouette with mild vascular congestion. Mild diffuse interstitial opacity compared to prior. No pneumothorax. IMPRESSION: 1. Probable tiny left effusion 2. Minimal vascular congestion. Mild diffuse increased interstitial opacity compared to prior, possible mild edema. Electronically Signed   By: Donavan Foil M.D.   On: 11/21/2017 18:38   Ct Head Wo Contrast  Result Date: 11/21/2017 CLINICAL DATA:  Altered mental status and seizure. EXAM: CT HEAD WITHOUT CONTRAST TECHNIQUE: Contiguous axial images were obtained from the base of the skull through the vertex without intravenous contrast. COMPARISON:  CT head dated Dec 11, 2012. FINDINGS: Brain: No evidence of acute infarction, hemorrhage,  hydrocephalus, extra-axial collection or mass lesion/mass effect. Stable cerebral atrophy and chronic microvascular ischemic changes. Vascular: No hyperdense vessel or unexpected calcification. Skull: Normal. Negative for fracture or focal lesion. Sinuses/Orbits: No acute finding. Other: None. IMPRESSION: 1.  No acute intracranial abnormality. Electronically Signed   By: Titus Dubin M.D.   On: 11/21/2017 16:02   Mr Brain Wo Contrast  Result Date: 11/22/2017 CLINICAL DATA:  Altered level of consciousness. Liver failure cirrhosis EXAM: MRI HEAD WITHOUT CONTRAST TECHNIQUE: Multiplanar, multiecho pulse sequences of the brain and surrounding structures were obtained without intravenous contrast. COMPARISON:  CT head 11/21/2017 FINDINGS: Brain: Moderate atrophy. Ventricular enlargement consistent with atrophy. Negative for acute infarct. Minimal chronic changes in the white matter. Negative for hemorrhage, mass, or fluid collection Vascular: Normal arterial flow voids Skull and upper cervical spine: Negative Sinuses/Orbits: Negative Other: None IMPRESSION: Moderate atrophy.  No acute intracranial abnormality. Electronically Signed   By: Franchot Gallo M.D.   On: 11/22/2017 10:39   Ct Abdomen Pelvis W Contrast  Result Date: 11/21/2017 CLINICAL DATA:  Near syncope, jaundice, fall. History of liver failure. EXAM: CT ABDOMEN AND PELVIS WITH CONTRAST TECHNIQUE: Multidetector CT imaging of the abdomen and pelvis was performed using the standard protocol following bolus administration of intravenous contrast. CONTRAST:  119mL ISOVUE-300 IOPAMIDOL (ISOVUE-300) INJECTION 61% COMPARISON:  Right upper quadrant ultrasound dated 09/30/2017 FINDINGS: Lower chest: Trace left pleural effusion. Hepatobiliary: Cirrhosis.  No focal hepatic lesion is seen. Cholelithiasis, without associated inflammatory changes. No intrahepatic or extrahepatic ductal dilatation. Pancreas: Mild fluid/ascites along the pancreatic tail (series  2/image 30), nonspecific. No pancreatic ductal dilatation or atrophy. Spleen: Spleen is normal in size. Adrenals/Urinary Tract: Adrenal glands are within normal limits. Kidneys are within normal limits. No hydronephrosis. Bladder is underdistended but unremarkable. Stomach/Bowel: Stomach is within normal limits. No evidence of bowel obstruction. Normal appendix (series 2/image 45). Mild sigmoid diverticulosis, without evidence of diverticulitis. Vascular/Lymphatic: No evidence of abdominal aortic aneurysm. Atherosclerotic calcifications of the abdominal aorta and branch vessels. Dominant splenorenal shunt.  Diminutive portal vein, patent. No suspicious abdominopelvic lymphadenopathy. Reproductive: Prostate is unremarkable. Other: Small volume abdominopelvic ascites, predominantly perihepatic and perisplenic. Fat/fluid in the left inguinal canal. Musculoskeletal: Visualized osseous structures are within normal limits. IMPRESSION: Mild fluid/ascites along the pancreatic tail,  nonspecific. Mild acute pancreatitis is not excluded. However, this may also be related to the patient's abdominopelvic ascites. Cirrhosis. Portal vein is diminutive but patent. Dominant splenorenal shunt. Small volume abdominopelvic ascites. Cholelithiasis, without associated inflammatory changes. Trace left pleural effusion. Electronically Signed   By: Julian Hy M.D.   On: 11/21/2017 18:40   Dg Chest Port 1 View  Result Date: 11/24/2017 CLINICAL DATA:  Respiratory failure, shortness of breath, cirrhosis, former smoker. EXAM: PORTABLE CHEST 1 VIEW COMPARISON:  Portable chest x-ray of Nov 24, 2017 FINDINGS: The lungs are mildly hypoinflated. The lung markings are coarse in the infrahilar regions. There is no alveolar infiltrate or pleural effusion. The heart is top-normal in size. The pulmonary vascularity is normal. The bony thorax exhibits no acute abnormality. IMPRESSION: Interval improvement in the appearance of the pulmonary  interstitium suggests decreased interstitial edema. No alveolar pneumonia nor significant pleural effusion. Electronically Signed   By: David  Martinique M.D.   On: 11/24/2017 09:13   Dg Hip Unilat With Pelvis 2-3 Views Left  Result Date: 11/21/2017 CLINICAL DATA:  Pain following fall EXAM: DG HIP (WITH OR WITHOUT PELVIS) 2-3V LEFT COMPARISON:  None. FINDINGS: Frontal pelvis as well as frontal and lateral left hip images were obtained. No fracture or dislocation. There is slight symmetric narrowing of both hip joints. No erosive change. IMPRESSION: No fracture or dislocation. There is slight symmetric narrowing of both hip joints. Electronically Signed   By: Lowella Grip III M.D.   On: 11/21/2017 14:59    Time Spent in minutes  30   Domenic Polite M.D on 11/25/2017 at 11:08 AM  Between 7am to 7pm - Page via [email or web address] - password [facility]

## 2017-11-25 NOTE — Progress Notes (Signed)
PT Cancellation Note  Patient Details Name: Logan Patrick MRN: [id number] DOB: Jun 21, 1951   Cancelled Treatment:    Reason Eval/Treat Not Completed: Medical issues which prohibited therapy(pt with Hgb 6.6 and not medically appropriate for mobility at this time)   Chantell Kunkler B Tonesha Tsou 11/25/2017, 10:17 AM  Elwyn Reach, [place]

## 2017-11-25 NOTE — Care Management Note (Signed)
Case Management Note  Patient Details  Name: Logan Patrick MRN: [id number] Date of Birth: 1950-07-28  Subjective/Objective:       Spoke to patient at the bedside. He states he lives at home alone, still worksm still drives. Denies barriers getting to MD office and getting or paying for medications.  He does not use or have any DME at home. If [facility] care needed at time of DC he would like to use [facility]             Action/Plan:  Follow for HH needs, would like to use [facility] if needed.   Expected Discharge Date:                  Expected Discharge Plan:     In-House Referral:     Discharge planning Services  CM Consult  Post Acute Care Choice:    Choice offered to:     DME Arranged:    DME Agency:     HH Arranged:    [place] Agency:  [place]  Status of Service:  In process, will continue to follow  If discussed at Long Length of Stay Meetings, dates discussed:    Additional Comments:  Carles Collet, RN 11/25/2017, 12:10 PM

## 2017-11-25 NOTE — Progress Notes (Signed)
[place] Progress Note Patient Name: Logan Patrick DOB: [birth year] MRN: [id number]   Date of Service  11/25/2017  HPI/Events of Note  Acute blood loss anemia secondary to thigh hematoma Hypokalemia  eICU Interventions  Transfuse 1 unit PRBC Kcl 20 meq iv X 1        [place]wo U Ogan 11/25/2017, 6:09 AM

## 2017-11-26 LAB — BPAM RBC
BLOOD PRODUCT EXPIRATION DATE: 201905102359
BLOOD PRODUCT EXPIRATION DATE: 201905102359
BLOOD PRODUCT EXPIRATION DATE: 201905252359
BLOOD PRODUCT EXPIRATION DATE: 201905272359
BLOOD PRODUCT EXPIRATION DATE: 201905272359
Blood Product Expiration Date: 201905252359
ISSUE DATE / TIME: 201905010826
ISSUE DATE / TIME: 201905011035
ISSUE DATE / TIME: 201905030648
ISSUE DATE / TIME: 201904301412
ISSUE DATE / TIME: 201904301412
ISSUE DATE / TIME: 201905031228
UNIT TYPE AND RH: 6200
UNIT TYPE AND RH: 6200
UNIT TYPE AND RH: 6200
Unit Type and Rh: 6200
Unit Type and Rh: 9500
Unit Type and Rh: 9500

## 2017-11-26 LAB — CBC
HCT: 24.5 % — ABNORMAL LOW (ref 39.0–52.0)
HCT: 29.3 % — ABNORMAL LOW (ref 39.0–52.0)
Hemoglobin: 8.3 g/dL — ABNORMAL LOW (ref 13.0–17.0)
Hemoglobin: 9.9 g/dL — ABNORMAL LOW (ref 13.0–17.0)
MCH: 31.6 pg (ref 26.0–34.0)
MCH: 31.8 pg (ref 26.0–34.0)
MCHC: 33.9 g/dL (ref 30.0–36.0)
MCHC: 33.8 g/dL (ref 30.0–36.0)
MCV: 93.2 fL (ref 78.0–100.0)
MCV: 94.2 fL (ref 78.0–100.0)
PLATELETS: 65 10*3/uL — AB (ref 150–400)
Platelets: 73 10*3/uL — ABNORMAL LOW (ref 150–400)
RBC: 2.63 MIL/uL — AB (ref 4.22–5.81)
RBC: 3.11 MIL/uL — ABNORMAL LOW (ref 4.22–5.81)
RDW: 20.9 % — ABNORMAL HIGH (ref 11.5–15.5)
RDW: 21.2 % — AB (ref 11.5–15.5)
WBC: 7.8 10*3/uL (ref 4.0–10.5)
WBC: 10.3 10*3/uL (ref 4.0–10.5)

## 2017-11-26 LAB — TYPE AND SCREEN
ABO/RH(D): A POS
Antibody Screen: NEGATIVE
UNIT DIVISION: 0
UNIT DIVISION: 0
Unit division: 0
Unit division: 0
Unit division: 0
Unit division: 0

## 2017-11-26 LAB — CULTURE, BLOOD (ROUTINE X 2)
Culture: NO GROWTH
Culture: NO GROWTH
SPECIAL REQUESTS: ADEQUATE
Special Requests: ADEQUATE

## 2017-11-26 LAB — BPAM FFP
BLOOD PRODUCT EXPIRATION DATE: 201905072359
Blood Product Expiration Date: 201905072359
Blood Product Expiration Date: 201905082359
Blood Product Expiration Date: 201905082359
ISSUE DATE / TIME: 201905031607
ISSUE DATE / TIME: 201905031654
ISSUE DATE / TIME: 201905031728
ISSUE DATE / TIME: 201905031852
Unit Type and Rh: 6200
Unit Type and Rh: 6200
Unit Type and Rh: 6200
Unit Type and Rh: 6200

## 2017-11-26 LAB — PREPARE FRESH FROZEN PLASMA
UNIT DIVISION: 0
UNIT DIVISION: 0
Unit division: 0

## 2017-11-26 LAB — COMPREHENSIVE METABOLIC PANEL
ALT: 27 U/L (ref 17–63)
ANION GAP: 10 (ref 5–15)
AST: 53 U/L — AB (ref 15–41)
Albumin: 2.4 g/dL — ABNORMAL LOW (ref 3.5–5.0)
Alkaline Phosphatase: 125 U/L (ref 38–126)
BUN: 23 mg/dL — AB (ref 6–20)
CALCIUM: 9.1 mg/dL (ref 8.9–10.3)
CHLORIDE: 102 mmol/L (ref 101–111)
CO2: 28 mmol/L (ref 22–32)
Creatinine, Ser: 1.54 mg/dL — ABNORMAL HIGH (ref 0.61–1.24)
GFR calc non Af Amer: 45 mL/min — ABNORMAL LOW (ref >60)
GFR, EST AFRICAN AMERICAN: 53 mL/min — AB (ref >60)
Glucose, Bld: 103 mg/dL — ABNORMAL HIGH (ref 65–99)
POTASSIUM: 2.8 mmol/L — AB (ref 3.5–5.1)
Sodium: 140 mmol/L (ref 135–145)
TOTAL PROTEIN: 5.5 g/dL — AB (ref 6.5–8.1)
Total Bilirubin: 12 mg/dL — ABNORMAL HIGH (ref 0.3–1.2)

## 2017-11-26 LAB — PROTIME-INR
INR: 1.9
PROTHROMBIN TIME: 21.7 s — AB (ref 11.4–15.2)

## 2017-11-26 MED ORDER — FUROSEMIDE 10 MG/ML IJ SOLN
40.0000 mg | Freq: Once | INTRAMUSCULAR | Status: AC
  Administered 2017-11-26: 40 mg via INTRAVENOUS
  Filled 2017-11-26: qty 4

## 2017-11-26 MED ORDER — POTASSIUM CHLORIDE CRYS ER 20 MEQ PO TBCR
40.0000 meq | EXTENDED_RELEASE_TABLET | ORAL | Status: AC
  Administered 2017-11-26 (×2): 40 meq via ORAL
  Filled 2017-11-26 (×2): qty 2

## 2017-11-26 MED ORDER — POTASSIUM CHLORIDE CRYS ER 20 MEQ PO TBCR
40.0000 meq | EXTENDED_RELEASE_TABLET | Freq: Once | ORAL | Status: AC
  Administered 2017-11-26: 40 meq via ORAL
  Filled 2017-11-26: qty 2

## 2017-11-26 MED ORDER — TRAMADOL HCL 50 MG PO TABS
50.0000 mg | ORAL_TABLET | Freq: Four times a day (QID) | ORAL | Status: DC | PRN
  Administered 2017-11-26 – 2017-11-29 (×5): 50 mg via ORAL
  Filled 2017-11-26 (×6): qty 1

## 2017-11-26 NOTE — Plan of Care (Signed)
  Problem: Elimination: Goal: Will not experience complications related to bowel motility Outcome: Completed/Met Goal: Will not experience complications related to urinary retention Outcome: Completed/Met

## 2017-11-26 NOTE — Plan of Care (Signed)
  Problem: Clinical Measurements: Goal: Diagnostic test results will improve Outcome: Progressing Goal: Respiratory complications will improve Outcome: Progressing Goal: Cardiovascular complication will be avoided Outcome: Progressing   Problem: Pain Managment: Goal: General experience of comfort will improve Outcome: Progressing   

## 2017-11-26 NOTE — Progress Notes (Signed)
[place] Progress Note Patient Name: Logan Patrick DOB: [birth year] MRN: [id number]   Date of Service  11/26/2017  HPI/Events of Note  Hypokalemia  eICU Interventions  Potassium replaced     Intervention Category Intermediate Interventions: Electrolyte abnormality - evaluation and management  Claudia Greenley 11/26/2017, 6:10 AM

## 2017-11-26 NOTE — Plan of Care (Signed)
Transferred to [place] [facility] via [place] and monitor, SCD's with pt, RN to receive in room

## 2017-11-26 NOTE — Progress Notes (Signed)
@IPLOG @        PROGRESS NOTE                                                                                                                                                                                                             Patient Demographics:    Logan Patrick, is a 67 y.o. male, DOB - Aug 24, 1950, ENI:[id number]  Admit date - 11/21/2017   Admitting Physician Jani Gravel, MD  Outpatient Primary MD for the patient is [name], MD  LOS - 5  Chief Complaint  Patient presents with  . Fall       Brief Narrative  - Logan Patrick  is a 67 y.o. male, w Etoh cirrhosis , hypertension who presents with c/o bending over at work and feeling light headed like he was going to have syncope and lost his  Balance.  Pt was brought into ED by ex- wife, In ED, pt apparently had episode of syncope. -noted to have hemorrhagic shock secondary to massive left thigh hematoma secondary to coagulopathy and thrombocytopenia from advanced liver cirrhosis. -required large amount of blood and FFP in ICU, now improving    Subjective:   -feels better, swelling is improving, mobility is little better as well   Assessment  & Plan :    1.  Massive left thigh hematoma/hemorrhagic shock -Following mechanical fall, worsened by auto anticoagulation and chronic thrombocytopenia from underlying cirrhosis -Status post 12 units of FFP, vitamin K and 6 units of PRBC so far -Orthopedics Dr. Marcelino Scot consulted as well, recommended to treat coagulopathy, he felt that surgical evacuation would precipitate further blood loss into negative pressure vacuum -case d/w VVS Dr.Brabham by Dr.Singh 4/30 -hemoglobin finally stable today, PT down to 19 -Monitor clinically, physical therapy evaluation -Elevated left leg as possible -TX out of ICU -Lasix 40 mg IV 1  2. Acute blood loss anemia -Secondary to massive left thigh hematoma as noted above -Status post 6 PRBCs so far, hemoglobin is 8.3 -Monitor CBC every 12  3.   Alcoholic cirrhosis with auto anticoagulation and ascites along with chronic thrombocytopenia.  Quit alcohol several years ago, supportive care - Continue oral Xifaxan and lactulose for now. -with profound thirst spacing, IV Lasix again today -hyperbilirubinemia worsened from baseline cirrhosis, secondary to large amount of blood product use and some degree of hemolysis   3.  Syncope  -Likely secondary to blood loss anemia and orthostatic hypotension -MRI brain was done on admission which is unrevealing -EEG noted diffuse slowing  4.  Hypotension-  due to blood loss related anemia,  - transfuse -blood pressure improved  5. ARF - due to #1 and #4 - transfuse, avoid hypotension and nephrotoxins, monitor. -improving  6. Hypomagnesemia - replaced, monitor.  DVT prophylaxis: SCDs  Diet : Heart Healthy  Family Communication  : None  Code Status : Full  Disposition Plan  : Tx to floor  Consults  : Orthopedics, PCCM  Procedures  :      DVT Prophylaxis  : None for now  Lab Results  Component Value Date   INR 1.90 11/26/2017   INR 2.17 11/25/2017   INR 2.10 11/24/2017     Lab Results  Component Value Date   PLT 65 (L) 11/26/2017    Inpatient Medications  Scheduled Meds: . feeding supplement (PRO-STAT SUGAR FREE 64)  30 mL Oral BID  . furosemide  40 mg Intravenous Once  . lactulose  30 g Oral BID  . pantoprazole  40 mg Oral BID  . potassium chloride  40 mEq Oral Once  . rifaximin  550 mg Oral BID   Continuous Infusions: . sodium chloride     PRN Meds:.diphenhydrAMINE, iopamidol, ipratropium-albuterol, morphine injection, ondansetron (ZOFRAN) IV, promethazine, sodium chloride, sodium chloride flush, traMADol  Antibiotics  :    Anti-infectives (From admission, onward)   Start     Dose/Rate Route Frequency Ordered Stop   11/21/17 2315  rifaximin (XIFAXAN) tablet 550 mg     550 mg Oral 2 times daily 11/21/17 2303     11/21/17 1745  cefTRIAXone (ROCEPHIN) 2 g in  sodium chloride 0.9 % 100 mL IVPB     2 g 200 mL/hr over 30 Minutes Intravenous  Once 11/21/17 1732 11/21/17 1901         Objective:   Vitals:   11/26/17 0600 11/26/17 0700 11/26/17 0800 11/26/17 0900  BP: 107/60 (!) 103/55  109/74  Pulse: (!) 102 99 (!) 106 91  Resp: 16 16 17 15   Temp: 99 F (37.2 C) 98.8 F (37.1 C)  98.2 F (36.8 C)  TempSrc:      SpO2: 95% 94% 95% 97%  Weight:      Height:        Wt Readings from Last 3 Encounters:  11/25/17 128 kg (282 lb 3 oz)  12/12/12 86.3 kg (190 lb 4.1 oz)     Intake/Output Summary (Last 24 hours) at 11/26/2017 1013 Last data filed at 11/26/2017 0900 Gross per 24 hour  Intake 2636 ml  Output 7500 ml  Net -4864 ml     Physical Exam  Gen: chronically ill-appearing male, alert awake oriented 3, pleasant HEENT: pupils equal reactive, positive icterus Lungs: Good air movement bilaterally, CTAB CVS: RRR,No Gallops,Rubs or new Murmurs Abd: soft, Non tender, non distended, BS present Extremities: 2+ lower extremity edema, massive left thigh hematoma with purplish discoloration of left upper thigh, scrotal edema, intact dorsalis pedis, distal sensations and intact capillary refill Skin: as above   Data Review:    CBC Recent Labs  Lab 11/21/17 1530  11/24/17 0404 11/24/17 1820 11/25/17 0445 11/25/17 1028 11/25/17 2045 11/26/17 0408  WBC 11.5*   < > 12.3* 10.7* 8.1  --  9.9 7.8  HGB 10.3*   < > 7.2* 7.2* 6.6* 8.0* 8.7* 8.3*  HCT 30.3*   < > 21.4* 21.1* 19.1* 23.8* 25.6* 24.5*  PLT 132*   < > 69* 71* 66*  --  63* 65*  MCV 105.6*   < > 95.5 95.9 96.5  --  92.8 93.2  MCH 35.9*   < > 32.1 32.7 33.3  --  31.5 31.6  MCHC 34.0   < > 33.6 34.1 34.6  --  34.0 33.9  RDW 17.4*   < > 20.5* 20.0* 19.5*  --  20.3* 20.9*  LYMPHSABS 1.6  --   --   --  1.6  --   --   --   MONOABS 0.9  --   --   --  0.8  --   --   --   EOSABS 0.1  --   --   --  0.3  --   --   --   BASOSABS 0.1  --   --   --  0.0  --   --   --    < > = values in  this interval not displayed.    Chemistries  Recent Labs  Lab 11/21/17 1530 11/22/17 1128 11/23/17 0603 11/24/17 0404 11/25/17 0445 11/26/17 0408  NA 136 137 134* 133* 135 140  K 3.2* 3.9 4.0 3.7 3.1* 2.8*  CL 107 108 102 101 103 102  CO2 18* 20* 20* 23 25 28   GLUCOSE 150* 151* 111* 98 93 103*  BUN 10 12 17  25* 28* 23*  CREATININE 0.99 1.56* 1.95* 2.34* 1.99* 1.54*  CALCIUM 8.4* 8.1* 8.2* 8.4* 8.7* 9.1  MG  --  1.2* 1.6* 1.7  --   --   AST 81* 61*  --   --   --  53*  ALT 35 31  --   --   --  27  ALKPHOS 143* 109  --   --   --  125  BILITOT 10.8* 9.6*  --   --   --  12.0*   ------------------------------------------------------------------------------------------------------------------ No results for input(s): CHOL, HDL, LDLCALC, TRIG, CHOLHDL, LDLDIRECT in the last 72 hours.  No results found for: HGBA1C ------------------------------------------------------------------------------------------------------------------ No results for input(s): TSH, T4TOTAL, T3FREE, THYROIDAB in the last 72 hours.  Invalid input(s): FREET3 ------------------------------------------------------------------------------------------------------------------ No results for input(s): VITAMINB12, FOLATE, FERRITIN, TIBC, IRON, RETICCTPCT in the last 72 hours.  Coagulation profile Recent Labs  Lab 11/22/17 1842 11/23/17 0603 11/24/17 0404 11/25/17 0445 11/26/17 0408  INR 2.10 2.15 2.10 2.17 1.90    No results for input(s): DDIMER in the last 72 hours.  Cardiac Enzymes Recent Labs  Lab 11/21/17 1532 11/21/17 2333  CKMB  --  5.7*  TROPONINI 0.03* <0.03   ------------------------------------------------------------------------------------------------------------------ No results found for: BNP  Micro Results Recent Results (from the past 240 hour(s))  Culture, blood (routine x 2)     Status: None (Preliminary result)   Collection Time: 11/21/17  3:20 PM  Result Value Ref Range Status    Specimen Description   Final    BLOOD LEFT ANTECUBITAL Performed at [facility], [address] [place], [place] [address]    Special Requests   Final    BOTTLES DRAWN AEROBIC AND ANAEROBIC Blood Culture adequate volume Performed at [facility], [address] [place], [place] [address]    Culture   Final    NO GROWTH 4 DAYS Performed at [facility], [place] [address] [place], [place] [address]    Report Status PENDING  Incomplete  Culture, blood (routine x 2)     Status: None (Preliminary result)   Collection Time: 11/21/17  3:40 PM  Result Value Ref Range Status   Specimen Description   Final    BLOOD  LEFT HAND Performed at [facility], [address] [place], [place] [address]    Special Requests   Final    BOTTLES DRAWN AEROBIC AND ANAEROBIC Blood Culture adequate volume Performed at [facility], [address] [place], [place] [address]    Culture   Final    NO GROWTH 4 DAYS Performed at [facility], [place] [address] [place], [place] [address]    Report Status PENDING  Incomplete  MRSA PCR Screening     Status: None   Collection Time: 11/22/17  3:34 PM  Result Value Ref Range Status   MRSA by PCR NEGATIVE NEGATIVE Final    Comment:        The GeneXpert MRSA Assay (FDA approved for NASAL specimens only), is one component of a comprehensive MRSA colonization surveillance program. It is not intended to diagnose MRSA infection nor to guide or monitor treatment for MRSA infections. Performed at [facility], [place] [address], [place], [place] [address]     Radiology Reports Dg Chest 1 View  Result Date: 11/21/2017 CLINICAL DATA:  Near syncope with jaundice EXAM: CHEST  1 VIEW COMPARISON:  10/11/2017 FINDINGS: Possible trace left effusion. No focal consolidation. Stable cardiomediastinal silhouette with mild vascular congestion. Mild diffuse interstitial opacity compared to prior. No pneumothorax.  IMPRESSION: 1. Probable tiny left effusion 2. Minimal vascular congestion. Mild diffuse increased interstitial opacity compared to prior, possible mild edema. Electronically Signed   By: Donavan Foil M.D.   On: 11/21/2017 18:38   Ct Head Wo Contrast  Result Date: 11/21/2017 CLINICAL DATA:  Altered mental status and seizure. EXAM: CT HEAD WITHOUT CONTRAST TECHNIQUE: Contiguous axial images were obtained from the base of the skull through the vertex without intravenous contrast. COMPARISON:  CT head dated Dec 11, 2012. FINDINGS: Brain: No evidence of acute infarction, hemorrhage, hydrocephalus, extra-axial collection or mass lesion/mass effect. Stable cerebral atrophy and chronic microvascular ischemic changes. Vascular: No hyperdense vessel or unexpected calcification. Skull: Normal. Negative for fracture or focal lesion. Sinuses/Orbits: No acute finding. Other: None. IMPRESSION: 1.  No acute intracranial abnormality. Electronically Signed   By: Titus Dubin M.D.   On: 11/21/2017 16:02   Mr Brain Wo Contrast  Result Date: 11/22/2017 CLINICAL DATA:  Altered level of consciousness. Liver failure cirrhosis EXAM: MRI HEAD WITHOUT CONTRAST TECHNIQUE: Multiplanar, multiecho pulse sequences of the brain and surrounding structures were obtained without intravenous contrast. COMPARISON:  CT head 11/21/2017 FINDINGS: Brain: Moderate atrophy. Ventricular enlargement consistent with atrophy. Negative for acute infarct. Minimal chronic changes in the white matter. Negative for hemorrhage, mass, or fluid collection Vascular: Normal arterial flow voids Skull and upper cervical spine: Negative Sinuses/Orbits: Negative Other: None IMPRESSION: Moderate atrophy.  No acute intracranial abnormality. Electronically Signed   By: Franchot Gallo M.D.   On: 11/22/2017 10:39   Ct Abdomen Pelvis W Contrast  Result Date: 11/21/2017 CLINICAL DATA:  Near syncope, jaundice, fall. History of liver failure. EXAM: CT ABDOMEN AND PELVIS  WITH CONTRAST TECHNIQUE: Multidetector CT imaging of the abdomen and pelvis was performed using the standard protocol following bolus administration of intravenous contrast. CONTRAST:  111mL ISOVUE-300 IOPAMIDOL (ISOVUE-300) INJECTION 61% COMPARISON:  Right upper quadrant ultrasound dated 09/30/2017 FINDINGS: Lower chest: Trace left pleural effusion. Hepatobiliary: Cirrhosis.  No focal hepatic lesion is seen. Cholelithiasis, without associated inflammatory changes. No intrahepatic or extrahepatic ductal dilatation. Pancreas: Mild fluid/ascites along the pancreatic tail (series 2/image 30), nonspecific. No pancreatic ductal dilatation  or atrophy. Spleen: Spleen is normal in size. Adrenals/Urinary Tract: Adrenal glands are within normal limits. Kidneys are within normal limits. No hydronephrosis. Bladder is underdistended but unremarkable. Stomach/Bowel: Stomach is within normal limits. No evidence of bowel obstruction. Normal appendix (series 2/image 45). Mild sigmoid diverticulosis, without evidence of diverticulitis. Vascular/Lymphatic: No evidence of abdominal aortic aneurysm. Atherosclerotic calcifications of the abdominal aorta and branch vessels. Dominant splenorenal shunt.  Diminutive portal vein, patent. No suspicious abdominopelvic lymphadenopathy. Reproductive: Prostate is unremarkable. Other: Small volume abdominopelvic ascites, predominantly perihepatic and perisplenic. Fat/fluid in the left inguinal canal. Musculoskeletal: Visualized osseous structures are within normal limits. IMPRESSION: Mild fluid/ascites along the pancreatic tail, nonspecific. Mild acute pancreatitis is not excluded. However, this may also be related to the patient's abdominopelvic ascites. Cirrhosis. Portal vein is diminutive but patent. Dominant splenorenal shunt. Small volume abdominopelvic ascites. Cholelithiasis, without associated inflammatory changes. Trace left pleural effusion. Electronically Signed   By: Julian Hy  M.D.   On: 11/21/2017 18:40   Dg Chest Port 1 View  Result Date: 11/24/2017 CLINICAL DATA:  Respiratory failure, shortness of breath, cirrhosis, former smoker. EXAM: PORTABLE CHEST 1 VIEW COMPARISON:  Portable chest x-ray of Nov 24, 2017 FINDINGS: The lungs are mildly hypoinflated. The lung markings are coarse in the infrahilar regions. There is no alveolar infiltrate or pleural effusion. The heart is top-normal in size. The pulmonary vascularity is normal. The bony thorax exhibits no acute abnormality. IMPRESSION: Interval improvement in the appearance of the pulmonary interstitium suggests decreased interstitial edema. No alveolar pneumonia nor significant pleural effusion. Electronically Signed   By: David  Martinique M.D.   On: 11/24/2017 09:13   Dg Hip Unilat With Pelvis 2-3 Views Left  Result Date: 11/21/2017 CLINICAL DATA:  Pain following fall EXAM: DG HIP (WITH OR WITHOUT PELVIS) 2-3V LEFT COMPARISON:  None. FINDINGS: Frontal pelvis as well as frontal and lateral left hip images were obtained. No fracture or dislocation. There is slight symmetric narrowing of both hip joints. No erosive change. IMPRESSION: No fracture or dislocation. There is slight symmetric narrowing of both hip joints. Electronically Signed   By: Lowella Grip III M.D.   On: 11/21/2017 14:59    Time Spent in minutes  30   Domenic Polite M.D on 11/26/2017 at 10:13 AM  Between 7am to 7pm - Page via [email or web address] - password [facility]

## 2017-11-26 NOTE — Plan of Care (Signed)
Report called to [place]. All questions answered

## 2017-11-27 ENCOUNTER — Inpatient Hospital Stay (HOSPITAL_COMMUNITY): Payer: PPO

## 2017-11-27 LAB — BASIC METABOLIC PANEL
Anion gap: 11 (ref 5–15)
BUN: 23 mg/dL — AB (ref 6–20)
CALCIUM: 8.9 mg/dL (ref 8.9–10.3)
CO2: 28 mmol/L (ref 22–32)
Chloride: 97 mmol/L — ABNORMAL LOW (ref 101–111)
Creatinine, Ser: 1.17 mg/dL (ref 0.61–1.24)
GFR calc Af Amer: >60 mL/min (ref >60)
GLUCOSE: 107 mg/dL — AB (ref 65–99)
POTASSIUM: 3 mmol/L — AB (ref 3.5–5.1)
Sodium: 136 mmol/L (ref 135–145)

## 2017-11-27 LAB — PROTIME-INR
INR: 2.16
PROTHROMBIN TIME: 23.9 s — AB (ref 11.4–15.2)

## 2017-11-27 LAB — CBC
HCT: 25.5 % — ABNORMAL LOW (ref 39.0–52.0)
Hemoglobin: 8.4 g/dL — ABNORMAL LOW (ref 13.0–17.0)
MCH: 31.3 pg (ref 26.0–34.0)
MCHC: 32.9 g/dL (ref 30.0–36.0)
MCV: 95.1 fL (ref 78.0–100.0)
Platelets: 75 10*3/uL — ABNORMAL LOW (ref 150–400)
RBC: 2.68 MIL/uL — ABNORMAL LOW (ref 4.22–5.81)
RDW: 21.1 % — AB (ref 11.5–15.5)
WBC: 9.4 10*3/uL (ref 4.0–10.5)

## 2017-11-27 MED ORDER — SODIUM CHLORIDE 0.9 % IV SOLN
Freq: Once | INTRAVENOUS | Status: AC
  Administered 2017-11-27: 12:00:00 via INTRAVENOUS

## 2017-11-27 MED ORDER — FUROSEMIDE 10 MG/ML IJ SOLN
40.0000 mg | Freq: Once | INTRAMUSCULAR | Status: AC
  Administered 2017-11-27: 40 mg via INTRAVENOUS
  Filled 2017-11-27: qty 4

## 2017-11-27 MED ORDER — POTASSIUM CHLORIDE CRYS ER 20 MEQ PO TBCR
40.0000 meq | EXTENDED_RELEASE_TABLET | Freq: Once | ORAL | Status: AC
  Administered 2017-11-27: 40 meq via ORAL
  Filled 2017-11-27: qty 2

## 2017-11-27 NOTE — Progress Notes (Signed)
Addressed foley catheter with Dr. Broadus Elisa and stated she wants foley catheter continued.  Will continue to do foley care per protocol.

## 2017-11-27 NOTE — Progress Notes (Addendum)
@IPLOG @        PROGRESS NOTE                                                                                                                                                                                                             Patient Demographics:    Logan Patrick, is a 67 y.o. male, DOB - [birth year], ZOX:[id number]  Admit date - 11/21/2017   Admitting Physician Jani Gravel, MD  Outpatient Primary MD for the patient is [name], MD  LOS - 6  Chief Complaint  Patient presents with  . Fall       Brief Narrative  - Gavino Fouch  is a 67 y.o. male, w Etoh cirrhosis , hypertension who presents with c/o bending over at work and feeling light headed like he was going to have syncope and lost his  Balance.  Pt was brought into ED by ex- wife, In ED, pt apparently had episode of syncope. -noted to have hemorrhagic shock secondary to massive left thigh hematoma secondary to coagulopathy and thrombocytopenia from advanced liver cirrhosis. -required large amount of blood and FFP in ICU, now improving    Subjective:   -was doing much better yesterday, however overnight start having more pain in his left thigh/hip with worsening swelling   Assessment  & Plan :    1.  Massive left thigh hematoma/hemorrhagic shock -Following mechanical fall, worsened by auto anticoagulation and chronic thrombocytopenia from underlying cirrhosis -Status post 12 units of FFP, vitamin K and 6 units of PRBC so far -Orthopedics Dr. Marcelino Scot consulted as well, recommended to treat coagulopathy, he felt that surgical evacuation would precipitate further blood loss into negative pressure vacuum -case d/w VVS Dr.Brabham by Dr.Singh 4/30 -hemoglobin finally stable now, PT down to 19, but back up to 24 again -We'll give 4 more units of FFP today -Monitor clinically, physical therapy evaluation -Elevated left leg as possible -monitor CBC and coags daily, d/w Hematology Dr.Mohamed, recommended to continue  supportive care as underlying cirrhosis/liver dz cannot be reversed -worsening swelling overnight, check CT -no s/s of infection yet  2. Acute blood loss anemia -Secondary to massive left thigh hematoma as noted above -Status post 6 PRBCs so far, hemoglobin is 8.3 -Monitor CBC daily  3.  Alcoholic cirrhosis with auto anticoagulation and ascites along with chronic thrombocytopenia.  Quit alcohol several years ago, supportive care - Continue oral Xifaxan and lactulose for now. -with profound thirst spacing, he has been given multiple doses of IV Lasix for last 48 hours -  hyperbilirubinemia worsened from baseline cirrhosis, secondary to large amount of blood product use and some degree of hemolysis   3.  Syncope  -Likely secondary to blood loss anemia and orthostatic hypotension -MRI brain was done on admission which is unrevealing -EEG noted diffuse slowing  4.  Hypotension- due to blood loss related anemia,  - transfuse -blood pressure improved  5. ARF - due to #1 and #4 - transfuse, avoid hypotension and nephrotoxins, monitor. -improving  6. Hypomagnesemia - replaced, monitor.  DVT prophylaxis: SCDs  Diet : Heart Healthy  Family Communication  : None  Code Status : Full  Disposition Plan  : Tx to floor  Consults  : Orthopedics, PCCM  Procedures  :      DVT Prophylaxis  : None for now  Lab Results  Component Value Date   INR 2.16 11/27/2017   INR 1.90 11/26/2017   INR 2.17 11/25/2017     Lab Results  Component Value Date   PLT 75 (L) 11/27/2017    Inpatient Medications  Scheduled Meds: . feeding supplement (PRO-STAT SUGAR FREE 64)  30 mL Oral BID  . lactulose  30 g Oral BID  . pantoprazole  40 mg Oral BID  . potassium chloride  40 mEq Oral Once  . rifaximin  550 mg Oral BID   Continuous Infusions: . sodium chloride    . sodium chloride     PRN Meds:.diphenhydrAMINE, iopamidol, ipratropium-albuterol, morphine injection, ondansetron (ZOFRAN) IV,  promethazine, sodium chloride, sodium chloride flush, traMADol  Antibiotics  :    Anti-infectives (From admission, onward)   Start     Dose/Rate Route Frequency Ordered Stop   11/21/17 2315  rifaximin (XIFAXAN) tablet 550 mg     550 mg Oral 2 times daily 11/21/17 2303     11/21/17 1745  cefTRIAXone (ROCEPHIN) 2 g in sodium chloride 0.9 % 100 mL IVPB     2 g 200 mL/hr over 30 Minutes Intravenous  Once 11/21/17 1732 11/21/17 1901         Objective:   Vitals:   11/26/17 1448 11/26/17 2006 11/27/17 0025 11/27/17 0445  BP: 106/66 (!) 99/45 (!) 91/52 117/78  Pulse: 97 100 (!) 102 96  Resp: 18 19 16 16   Temp: 98.3 F (36.8 C) 98.1 F (36.7 C) 98.3 F (36.8 C) 97.8 F (36.6 C)  TempSrc: Oral Oral Oral Oral  SpO2: 96% 97% 96% 97%  Weight: 122.8 kg (270 lb 11.2 oz)     Height: 5' 10.5" (1.791 m)       Wt Readings from Last 3 Encounters:  11/26/17 122.8 kg (270 lb 11.2 oz)  12/12/12 86.3 kg (190 lb 4.1 oz)     Intake/Output Summary (Last 24 hours) at 11/27/2017 0951 Last data filed at 11/27/2017 0800 Gross per 24 hour  Intake 1380 ml  Output 3101 ml  Net -1721 ml     Physical Exam  Gen: chronically ill-appearing male, alert awake oriented 3, pleasant HEENT: pupils equal reactive, positive icterus Lungs: Good air movement bilaterally, CTAB CVS: RRR,No Gallops,Rubs or new Murmurs Abd: soft, Non tender, non distended, BS present Extremities: 2+ lower extremity edema, massive left thigh hematoma with purplish discoloration of left upper thigh, scrotal edema, intact dorsalis pedis, distal sensations and intact capillary refill Skin: as above   Data Review:    CBC Recent Labs  Lab 11/21/17 1530  11/25/17 0445 11/25/17 1028 11/25/17 2045 11/26/17 0408 11/26/17 1512 11/27/17 0437  WBC 11.5*   < >  8.1  --  9.9 7.8 10.3 9.4  HGB 10.3*   < > 6.6* 8.0* 8.7* 8.3* 9.9* 8.4*  HCT 30.3*   < > 19.1* 23.8* 25.6* 24.5* 29.3* 25.5*  PLT 132*   < > 66*  --  63* 65* 73* 75*   MCV 105.6*   < > 96.5  --  92.8 93.2 94.2 95.1  MCH 35.9*   < > 33.3  --  31.5 31.6 31.8 31.3  MCHC 34.0   < > 34.6  --  34.0 33.9 33.8 32.9  RDW 17.4*   < > 19.5*  --  20.3* 20.9* 21.2* 21.1*  LYMPHSABS 1.6  --  1.6  --   --   --   --   --   MONOABS 0.9  --  0.8  --   --   --   --   --   EOSABS 0.1  --  0.3  --   --   --   --   --   BASOSABS 0.1  --  0.0  --   --   --   --   --    < > = values in this interval not displayed.    Chemistries  Recent Labs  Lab 11/21/17 1530 11/22/17 1128 11/23/17 0603 11/24/17 0404 11/25/17 0445 11/26/17 0408 11/27/17 0437  NA 136 137 134* 133* 135 140 136  K 3.2* 3.9 4.0 3.7 3.1* 2.8* 3.0*  CL 107 108 102 101 103 102 97*  CO2 18* 20* 20* 23 25 28 28   GLUCOSE 150* 151* 111* 98 93 103* 107*  BUN 10 12 17  25* 28* 23* 23*  CREATININE 0.99 1.56* 1.95* 2.34* 1.99* 1.54* 1.17  CALCIUM 8.4* 8.1* 8.2* 8.4* 8.7* 9.1 8.9  MG  --  1.2* 1.6* 1.7  --   --   --   AST 81* 61*  --   --   --  53*  --   ALT 35 31  --   --   --  27  --   ALKPHOS 143* 109  --   --   --  125  --   BILITOT 10.8* 9.6*  --   --   --  12.0*  --    ------------------------------------------------------------------------------------------------------------------ No results for input(s): CHOL, HDL, LDLCALC, TRIG, CHOLHDL, LDLDIRECT in the last 72 hours.  No results found for: HGBA1C ------------------------------------------------------------------------------------------------------------------ No results for input(s): TSH, T4TOTAL, T3FREE, THYROIDAB in the last 72 hours.  Invalid input(s): FREET3 ------------------------------------------------------------------------------------------------------------------ No results for input(s): VITAMINB12, FOLATE, FERRITIN, TIBC, IRON, RETICCTPCT in the last 72 hours.  Coagulation profile Recent Labs  Lab 11/23/17 0603 11/24/17 0404 11/25/17 0445 11/26/17 0408 11/27/17 0437  INR 2.15 2.10 2.17 1.90 2.16    No results for  input(s): DDIMER in the last 72 hours.  Cardiac Enzymes Recent Labs  Lab 11/21/17 1532 11/21/17 2333  CKMB  --  5.7*  TROPONINI 0.03* <0.03   ------------------------------------------------------------------------------------------------------------------ No results found for: BNP  Micro Results Recent Results (from the past 240 hour(s))  Culture, blood (routine x 2)     Status: None   Collection Time: 11/21/17  3:20 PM  Result Value Ref Range Status   Specimen Description   Final    BLOOD LEFT ANTECUBITAL Performed at [facility], [address] [place], [place] [address]    Special Requests   Final    BOTTLES DRAWN AEROBIC AND ANAEROBIC Blood Culture adequate volume Performed at [facility], [address] [place], [place] [address]    Culture   Final    NO GROWTH 5 DAYS Performed at [facility], [place] [address], [place], [place] [address]    Report Status 11/26/2017 FINAL  Final  Culture, blood (routine x 2)     Status: None   Collection Time: 11/21/17  3:40 PM  Result Value Ref Range Status   Specimen Description   Final    BLOOD LEFT HAND Performed at [facility], [address] [place], [place] [address]    Special Requests   Final    BOTTLES DRAWN AEROBIC AND ANAEROBIC Blood Culture adequate volume Performed at [facility], [address] [place], [place] [address]    Culture   Final    NO GROWTH 5 DAYS Performed at [facility], [place] [address], [place], [place] [address]    Report Status 11/26/2017 FINAL  Final  MRSA PCR Screening     Status: None   Collection Time: 11/22/17  3:34 PM  Result Value Ref Range Status   MRSA by PCR NEGATIVE NEGATIVE Final    Comment:        The GeneXpert MRSA Assay (FDA approved for NASAL specimens only), is one component of a comprehensive MRSA colonization surveillance program. It is not intended to diagnose MRSA infection nor to guide or monitor  treatment for MRSA infections. Performed at [facility], [place] [address], [place], [place] [address]     Radiology Reports Dg Chest 1 View  Result Date: 11/21/2017 CLINICAL DATA:  Near syncope with jaundice EXAM: CHEST  1 VIEW COMPARISON:  10/11/2017 FINDINGS: Possible trace left effusion. No focal consolidation. Stable cardiomediastinal silhouette with mild vascular congestion. Mild diffuse interstitial opacity compared to prior. No pneumothorax. IMPRESSION: 1. Probable tiny left effusion 2. Minimal vascular congestion. Mild diffuse increased interstitial opacity compared to prior, possible mild edema. Electronically Signed   By: Donavan Foil M.D.   On: 11/21/2017 18:38   Ct Head Wo Contrast  Result Date: 11/21/2017 CLINICAL DATA:  Altered mental status and seizure. EXAM: CT HEAD WITHOUT CONTRAST TECHNIQUE: Contiguous axial images were obtained from the base of the skull through the vertex without intravenous contrast. COMPARISON:  CT head dated Dec 11, 2012. FINDINGS: Brain: No evidence of acute infarction, hemorrhage, hydrocephalus, extra-axial collection or mass lesion/mass effect. Stable cerebral atrophy and chronic microvascular ischemic changes. Vascular: No hyperdense vessel or unexpected calcification. Skull: Normal. Negative for fracture or focal lesion. Sinuses/Orbits: No acute finding. Other: None. IMPRESSION: 1.  No acute intracranial abnormality. Electronically Signed   By: Titus Dubin M.D.   On: 11/21/2017 16:02   Mr Brain Wo Contrast  Result Date: 11/22/2017 CLINICAL DATA:  Altered level of consciousness. Liver failure cirrhosis EXAM: MRI HEAD WITHOUT CONTRAST TECHNIQUE: Multiplanar, multiecho pulse sequences of the brain and surrounding structures were obtained without intravenous contrast. COMPARISON:  CT head 11/21/2017 FINDINGS: Brain: Moderate atrophy. Ventricular enlargement consistent with atrophy. Negative for acute infarct. Minimal chronic changes in the white  matter. Negative for hemorrhage, mass, or fluid collection Vascular: Normal arterial flow voids Skull and upper cervical spine: Negative Sinuses/Orbits: Negative Other: None IMPRESSION: Moderate atrophy.  No acute intracranial abnormality. Electronically Signed   By: Franchot Gallo M.D.   On: 11/22/2017 10:39   Ct Abdomen Pelvis W Contrast  Result Date: 11/21/2017 CLINICAL DATA:  Near syncope, jaundice, fall. History of liver failure. EXAM:  CT ABDOMEN AND PELVIS WITH CONTRAST TECHNIQUE: Multidetector CT imaging of the abdomen and pelvis was performed using the standard protocol following bolus administration of intravenous contrast. CONTRAST:  150mL ISOVUE-300 IOPAMIDOL (ISOVUE-300) INJECTION 61% COMPARISON:  Right upper quadrant ultrasound dated 09/30/2017 FINDINGS: Lower chest: Trace left pleural effusion. Hepatobiliary: Cirrhosis.  No focal hepatic lesion is seen. Cholelithiasis, without associated inflammatory changes. No intrahepatic or extrahepatic ductal dilatation. Pancreas: Mild fluid/ascites along the pancreatic tail (series 2/image 30), nonspecific. No pancreatic ductal dilatation or atrophy. Spleen: Spleen is normal in size. Adrenals/Urinary Tract: Adrenal glands are within normal limits. Kidneys are within normal limits. No hydronephrosis. Bladder is underdistended but unremarkable. Stomach/Bowel: Stomach is within normal limits. No evidence of bowel obstruction. Normal appendix (series 2/image 45). Mild sigmoid diverticulosis, without evidence of diverticulitis. Vascular/Lymphatic: No evidence of abdominal aortic aneurysm. Atherosclerotic calcifications of the abdominal aorta and branch vessels. Dominant splenorenal shunt.  Diminutive portal vein, patent. No suspicious abdominopelvic lymphadenopathy. Reproductive: Prostate is unremarkable. Other: Small volume abdominopelvic ascites, predominantly perihepatic and perisplenic. Fat/fluid in the left inguinal canal. Musculoskeletal: Visualized osseous  structures are within normal limits. IMPRESSION: Mild fluid/ascites along the pancreatic tail, nonspecific. Mild acute pancreatitis is not excluded. However, this may also be related to the patient's abdominopelvic ascites. Cirrhosis. Portal vein is diminutive but patent. Dominant splenorenal shunt. Small volume abdominopelvic ascites. Cholelithiasis, without associated inflammatory changes. Trace left pleural effusion. Electronically Signed   By: Julian Hy M.D.   On: 11/21/2017 18:40   Dg Chest Port 1 View  Result Date: 11/24/2017 CLINICAL DATA:  Respiratory failure, shortness of breath, cirrhosis, former smoker. EXAM: PORTABLE CHEST 1 VIEW COMPARISON:  Portable chest x-ray of Nov 24, 2017 FINDINGS: The lungs are mildly hypoinflated. The lung markings are coarse in the infrahilar regions. There is no alveolar infiltrate or pleural effusion. The heart is top-normal in size. The pulmonary vascularity is normal. The bony thorax exhibits no acute abnormality. IMPRESSION: Interval improvement in the appearance of the pulmonary interstitium suggests decreased interstitial edema. No alveolar pneumonia nor significant pleural effusion. Electronically Signed   By: David  Martinique M.D.   On: 11/24/2017 09:13   Dg Hip Unilat With Pelvis 2-3 Views Left  Result Date: 11/21/2017 CLINICAL DATA:  Pain following fall EXAM: DG HIP (WITH OR WITHOUT PELVIS) 2-3V LEFT COMPARISON:  None. FINDINGS: Frontal pelvis as well as frontal and lateral left hip images were obtained. No fracture or dislocation. There is slight symmetric narrowing of both hip joints. No erosive change. IMPRESSION: No fracture or dislocation. There is slight symmetric narrowing of both hip joints. Electronically Signed   By: Lowella Grip III M.D.   On: 11/21/2017 14:59    Time Spent in minutes  30   Domenic Polite M.D on 11/27/2017 at 9:51 AM  Between 7am to 7pm - Page via [email or web address] - password [email or web address]

## 2017-11-28 LAB — COMPREHENSIVE METABOLIC PANEL
ALT: 28 U/L (ref 17–63)
ANION GAP: 11 (ref 5–15)
AST: 54 U/L — AB (ref 15–41)
Albumin: 2.5 g/dL — ABNORMAL LOW (ref 3.5–5.0)
Alkaline Phosphatase: 98 U/L (ref 38–126)
BILIRUBIN TOTAL: 15.9 mg/dL — AB (ref 0.3–1.2)
BUN: 26 mg/dL — AB (ref 6–20)
CO2: 27 mmol/L (ref 22–32)
Calcium: 8.7 mg/dL — ABNORMAL LOW (ref 8.9–10.3)
Chloride: 98 mmol/L — ABNORMAL LOW (ref 101–111)
Creatinine, Ser: 1.09 mg/dL (ref 0.61–1.24)
Glucose, Bld: 84 mg/dL (ref 65–99)
POTASSIUM: 3.7 mmol/L (ref 3.5–5.1)
Sodium: 136 mmol/L (ref 135–145)
TOTAL PROTEIN: 5.7 g/dL — AB (ref 6.5–8.1)

## 2017-11-28 LAB — PREPARE FRESH FROZEN PLASMA
UNIT DIVISION: 0
Unit division: 0
Unit division: 0

## 2017-11-28 LAB — CBC
HEMATOCRIT: 22.5 % — AB (ref 39.0–52.0)
Hemoglobin: 7.4 g/dL — ABNORMAL LOW (ref 13.0–17.0)
MCH: 32 pg (ref 26.0–34.0)
MCHC: 32.9 g/dL (ref 30.0–36.0)
MCV: 97.4 fL (ref 78.0–100.0)
Platelets: 74 10*3/uL — ABNORMAL LOW (ref 150–400)
RBC: 2.31 MIL/uL — AB (ref 4.22–5.81)
RDW: 21.3 % — AB (ref 11.5–15.5)
WBC: 12.4 10*3/uL — AB (ref 4.0–10.5)

## 2017-11-28 LAB — BPAM FFP
BLOOD PRODUCT EXPIRATION DATE: 201905082359
BLOOD PRODUCT EXPIRATION DATE: 201905082359
BLOOD PRODUCT EXPIRATION DATE: 201905102359
Blood Product Expiration Date: 201905102359
ISSUE DATE / TIME: 201905051155
ISSUE DATE / TIME: 201905051753
ISSUE DATE / TIME: 201905051353
ISSUE DATE / TIME: 201905051610
UNIT TYPE AND RH: 600
Unit Type and Rh: 6200
Unit Type and Rh: 6200
Unit Type and Rh: 6200

## 2017-11-28 LAB — HEMOGLOBIN AND HEMATOCRIT, BLOOD
HCT: 24.7 % — ABNORMAL LOW (ref 39.0–52.0)
Hemoglobin: 8 g/dL — ABNORMAL LOW (ref 13.0–17.0)

## 2017-11-28 LAB — PROTIME-INR
INR: 1.99
PROTHROMBIN TIME: 22.4 s — AB (ref 11.4–15.2)

## 2017-11-28 LAB — PREPARE RBC (CROSSMATCH)

## 2017-11-28 MED ORDER — FUROSEMIDE 10 MG/ML IJ SOLN
40.0000 mg | Freq: Once | INTRAMUSCULAR | Status: AC
  Administered 2017-11-28: 40 mg via INTRAVENOUS

## 2017-11-28 MED ORDER — POLYETHYLENE GLYCOL 3350 17 G PO PACK: 17.0000 g | PACK | Freq: Every day | ORAL | Status: DC

## 2017-11-28 MED ORDER — SODIUM CHLORIDE 0.9 % IV SOLN
Freq: Once | INTRAVENOUS | Status: AC
  Administered 2017-11-28: 13:00:00 via INTRAVENOUS

## 2017-11-28 MED ORDER — POLYETHYLENE GLYCOL 3350 17 G PO PACK
17.0000 g | PACK | Freq: Two times a day (BID) | ORAL | Status: DC
  Administered 2017-11-29 – 2017-12-04 (×9): 17 g via ORAL
  Filled 2017-11-28 (×11): qty 1

## 2017-11-28 MED ORDER — VANCOMYCIN HCL IN DEXTROSE 1-5 GM/200ML-% IV SOLN
1000.0000 mg | Freq: Two times a day (BID) | INTRAVENOUS | Status: DC
  Administered 2017-11-28 – 2017-11-30 (×5): 1000 mg via INTRAVENOUS
  Filled 2017-11-28 (×5): qty 200

## 2017-11-28 NOTE — Consult Note (Signed)
   [place] CM Inpatient Consult   11/28/2017  Logan Patrick 1951/07/06 [id number]  Patient screened for [facility] with his [employer] plan.  Met with the patient at the bedside.  Patient states, "This has not been a good day."  He is grimacing stating difficulty swallowing.  Patient's nurse enters room for medication administration.  Explained [facility] services and patient states, "You can leave the information for my daughter.  She handles my things now."  A brochure and 24 hour nurse advise line with contact information given. Will follow up. Patient endorses Dr. Kathryne Eriksson as his is primary care provider.  Patient states he will be staying with his daughter when he leaves the hospital.  Nurse states patient is to get some blood today.   For questions contact:   Natividad Brood, RN BSN [name]  [phone] business mobile phone Toll free office [phone]

## 2017-11-28 NOTE — Progress Notes (Signed)
OT Cancellation Note  Patient Details Name: Logan Patrick MRN: [id number] DOB: 07/09/1951   Cancelled Treatment:    Reason Eval/Treat Not Completed: Other (comment). Rn in room starting pt's blood. Will re-attempt tomorrow.  Almon Register [phone] 11/28/2017, 1:58 PM

## 2017-11-28 NOTE — Progress Notes (Addendum)
@IPLOG @        PROGRESS NOTE                                                                                                                                                                                                             Patient Demographics:    Logan Patrick, is a 67 y.o. male, DOB - [birth year], ZYS:[id number]  Admit date - 11/21/2017   Admitting Physician Jani Gravel, MD  Outpatient Primary MD for the patient is [name], MD  LOS - 7  Chief Complaint  Patient presents with  . Fall       Brief Narrative  - Logan Patrick  is a 67 y.o. male, w Etoh cirrhosis , hypertension who presents with c/o bending over at work and feeling light headed like he was going to have syncope and lost his  Balance.  Pt was brought into ED by ex- wife, In ED, pt apparently had episode of syncope. -noted to have hemorrhagic shock secondary to massive left thigh hematoma secondary to coagulopathy and thrombocytopenia from advanced liver cirrhosis. -required large amount of blood and FFP in ICU, now improving    Subjective:   -continues to have L thigh pain since yesterday -no fevers -breathing ok   Assessment  & Plan :    1.  Massive left thigh hematoma/hemorrhagic shock -Following mechanical fall, worsened by auto anticoagulation and chronic thrombocytopenia from underlying cirrhosis -Status post 16 units of FFP, vitamin K and 6 units of PRBC so far -Orthopedics Dr. Marcelino Scot consulted as well, recommended to treat coagulopathy -case d/w VVS Dr.Brabham by Dr.Singh 4/30 -hemoglobin more stable in past 3days, down to 7.4, will give another unit now, PT fluctuates but cannot be reversed for long due to advanced cirrhosis -Monitor clinically, physical therapy evaluation -Elevated left leg as possible -monitor CBC and coags daily, d/w Hematology Dr.Mohamed, recommended to continue supportive care as underlying cirrhosis/liver dz cannot be reversed -worsening swelling 5/5 am onwards, CT shows  large hematoma, will d/w Ortho, risk of infection necrosis -add IV Vanc  2. Acute blood loss anemia -Secondary to massive left thigh hematoma as noted above -Status post 6 PRBCs so far, hemoglobin is 7.4now -Monitor CBC daily  3.  Alcoholic cirrhosis with auto anticoagulation and ascites along with chronic thrombocytopenia.  Quit alcohol several years ago, supportive care - Continue oral Xifaxan and lactulose for now. -with profound thirst spacing, he has been given multiple doses of IV Lasix for last 48 hours -hyperbilirubinemia worsened from baseline cirrhosis, secondary to  large amount of blood product use and some degree of hemolysis   3.  Syncope  -Likely secondary to blood loss anemia and orthostatic hypotension -MRI brain was done on admission which is unrevealing -EEG noted diffuse slowing  4.  Hypotension- due to blood loss related anemia,  - transfuse -blood pressure improved  5. ARF - due to #1 and #4 - transfuse, avoid hypotension and nephrotoxins, monitor. -improving  6. Hypomagnesemia - replaced, monitor.  DVT prophylaxis: SCDs  Diet : Heart Healthy  Family Communication  : None  Code Status : Full  Disposition Plan  : Tx to floor  Consults  : Orthopedics, PCCM D/w Hematology Dr.Mohamed 5/4  Procedures  :      DVT Prophylaxis  : None for now  Lab Results  Component Value Date   INR 1.99 11/28/2017   INR 2.16 11/27/2017   INR 1.90 11/26/2017     Lab Results  Component Value Date   PLT 74 (L) 11/28/2017    Inpatient Medications  Scheduled Meds: . feeding supplement (PRO-STAT SUGAR FREE 64)  30 mL Oral BID  . furosemide  40 mg Intravenous Once  . lactulose  30 g Oral BID  . pantoprazole  40 mg Oral BID  . polyethylene glycol  17 g Oral Daily  . rifaximin  550 mg Oral BID   Continuous Infusions: . sodium chloride    . sodium chloride    . vancomycin     PRN Meds:.diphenhydrAMINE, iopamidol, ipratropium-albuterol, morphine injection,  ondansetron (ZOFRAN) IV, promethazine, sodium chloride, sodium chloride flush, traMADol  Antibiotics  :    Anti-infectives (From admission, onward)   Start     Dose/Rate Route Frequency Ordered Stop   11/28/17 1000  vancomycin (VANCOCIN) IVPB 1000 mg/200 mL premix     1,000 mg 200 mL/hr over 60 Minutes Intravenous Every 12 hours 11/28/17 0903     11/21/17 2315  rifaximin (XIFAXAN) tablet 550 mg     550 mg Oral 2 times daily 11/21/17 2303     11/21/17 1745  cefTRIAXone (ROCEPHIN) 2 g in sodium chloride 0.9 % 100 mL IVPB     2 g 200 mL/hr over 30 Minutes Intravenous  Once 11/21/17 1732 11/21/17 1901         Objective:   Vitals:   11/27/17 1815 11/27/17 1911 11/28/17 0554 11/28/17 0815  BP: (!) 110/59 119/74 (!) 118/57 117/61  Pulse: (!) 103 (!) 103 (!) 111 (!) 112  Resp: 18 20 18    Temp: 99 F (37.2 C) 98.8 F (37.1 C) 98.1 F (36.7 C) 98.9 F (37.2 C)  TempSrc: Oral Oral Oral Oral  SpO2: 96% 99% 98% 97%  Weight:   127.1 kg (280 lb 3.2 oz)   Height:        Wt Readings from Last 3 Encounters:  11/28/17 127.1 kg (280 lb 3.2 oz)  12/12/12 86.3 kg (190 lb 4.1 oz)     Intake/Output Summary (Last 24 hours) at 11/28/2017 1032 Last data filed at 11/28/2017 0600 Gross per 24 hour  Intake 2050.01 ml  Output 1925 ml  Net 125.01 ml     Physical Exam  Gen: Awake, Alert, Oriented X 3,  HEENT: PERRLA, Neck supple, icteric Lungs: Good air movement bilaterally, CTAB CVS: RRR,No Gallops,Rubs or new Murmurs Abd: soft, Non tender, non distended, BS present Extremities:  2+ lower extremity edema, massive left thigh hematoma with purplish discoloration of left upper thigh, scrotal edema, intact dorsalis pedis, distal sensations and intact  capillary refill Skin: as above   Data Review:    CBC Recent Labs  Lab 11/21/17 1530  11/25/17 0445  11/25/17 2045 11/26/17 0408 11/26/17 1512 11/27/17 0437 11/28/17 0507  WBC 11.5*   < > 8.1  --  9.9 7.8 10.3 9.4 12.4*  HGB 10.3*   <  > 6.6*   < > 8.7* 8.3* 9.9* 8.4* 7.4*  HCT 30.3*   < > 19.1*   < > 25.6* 24.5* 29.3* 25.5* 22.5*  PLT 132*   < > 66*  --  63* 65* 73* 75* 74*  MCV 105.6*   < > 96.5  --  92.8 93.2 94.2 95.1 97.4  MCH 35.9*   < > 33.3  --  31.5 31.6 31.8 31.3 32.0  MCHC 34.0   < > 34.6  --  34.0 33.9 33.8 32.9 32.9  RDW 17.4*   < > 19.5*  --  20.3* 20.9* 21.2* 21.1* 21.3*  LYMPHSABS 1.6  --  1.6  --   --   --   --   --   --   MONOABS 0.9  --  0.8  --   --   --   --   --   --   EOSABS 0.1  --  0.3  --   --   --   --   --   --   BASOSABS 0.1  --  0.0  --   --   --   --   --   --    < > = values in this interval not displayed.    Chemistries  Recent Labs  Lab 11/21/17 1530 11/22/17 1128 11/23/17 0603 11/24/17 0404 11/25/17 0445 11/26/17 0408 11/27/17 0437 11/28/17 0507  NA 136 137 134* 133* 135 140 136 136  K 3.2* 3.9 4.0 3.7 3.1* 2.8* 3.0* 3.7  CL 107 108 102 101 103 102 97* 98*  CO2 18* 20* 20* 23 25 28 28 27   GLUCOSE 150* 151* 111* 98 93 103* 107* 84  BUN 10 12 17  25* 28* 23* 23* 26*  CREATININE 0.99 1.56* 1.95* 2.34* 1.99* 1.54* 1.17 1.09  CALCIUM 8.4* 8.1* 8.2* 8.4* 8.7* 9.1 8.9 8.7*  MG  --  1.2* 1.6* 1.7  --   --   --   --   AST 81* 61*  --   --   --  53*  --  54*  ALT 35 31  --   --   --  27  --  28  ALKPHOS 143* 109  --   --   --  125  --  98  BILITOT 10.8* 9.6*  --   --   --  12.0*  --  15.9*   ------------------------------------------------------------------------------------------------------------------ No results for input(s): CHOL, HDL, LDLCALC, TRIG, CHOLHDL, LDLDIRECT in the last 72 hours.  No results found for: HGBA1C ------------------------------------------------------------------------------------------------------------------ No results for input(s): TSH, T4TOTAL, T3FREE, THYROIDAB in the last 72 hours.  Invalid input(s): FREET3 ------------------------------------------------------------------------------------------------------------------ No results for input(s):  VITAMINB12, FOLATE, FERRITIN, TIBC, IRON, RETICCTPCT in the last 72 hours.  Coagulation profile Recent Labs  Lab 11/24/17 0404 11/25/17 0445 11/26/17 0408 11/27/17 0437 11/28/17 0507  INR 2.10 2.17 1.90 2.16 1.99    No results for input(s): DDIMER in the last 72 hours.  Cardiac Enzymes Recent Labs  Lab 11/21/17 1532 11/21/17 2333  CKMB  --  5.7*  TROPONINI 0.03* <0.03   ------------------------------------------------------------------------------------------------------------------ No results found for: BNP  Micro Results  Recent Results (from the past 240 hour(s))  Culture, blood (routine x 2)     Status: None   Collection Time: 11/21/17  3:20 PM  Result Value Ref Range Status   Specimen Description   Final    BLOOD LEFT ANTECUBITAL Performed at [facility], [address] [place], [place] [address]    Special Requests   Final    BOTTLES DRAWN AEROBIC AND ANAEROBIC Blood Culture adequate volume Performed at [facility], [address] [place], [place] [address]    Culture   Final    NO GROWTH 5 DAYS Performed at [facility], [place] [address], [place], [place] [address]    Report Status 11/26/2017 FINAL  Final  Culture, blood (routine x 2)     Status: None   Collection Time: 11/21/17  3:40 PM  Result Value Ref Range Status   Specimen Description   Final    BLOOD LEFT HAND Performed at [facility], [address] [place], [place] [address]    Special Requests   Final    BOTTLES DRAWN AEROBIC AND ANAEROBIC Blood Culture adequate volume Performed at [facility], [address] [place], [place] [address]    Culture   Final    NO GROWTH 5 DAYS Performed at [facility], [place] [address] [place], [place] [address]    Report Status 11/26/2017 FINAL  Final  MRSA PCR Screening     Status: None   Collection Time: 11/22/17  3:34 PM  Result Value Ref Range Status   MRSA by PCR NEGATIVE NEGATIVE Final     Comment:        The GeneXpert MRSA Assay (FDA approved for NASAL specimens only), is one component of a comprehensive MRSA colonization surveillance program. It is not intended to diagnose MRSA infection nor to guide or monitor treatment for MRSA infections. Performed at [facility], [place] [address] [place], [place] [address]     Radiology Reports Dg Chest 1 View  Result Date: 11/21/2017 CLINICAL DATA:  Near syncope with jaundice EXAM: CHEST  1 VIEW COMPARISON:  10/11/2017 FINDINGS: Possible trace left effusion. No focal consolidation. Stable cardiomediastinal silhouette with mild vascular congestion. Mild diffuse interstitial opacity compared to prior. No pneumothorax. IMPRESSION: 1. Probable tiny left effusion 2. Minimal vascular congestion. Mild diffuse increased interstitial opacity compared to prior, possible mild edema. Electronically Signed   By: Donavan Foil M.D.   On: 11/21/2017 18:38   Ct Head Wo Contrast  Result Date: 11/21/2017 CLINICAL DATA:  Altered mental status and seizure. EXAM: CT HEAD WITHOUT CONTRAST TECHNIQUE: Contiguous axial images were obtained from the base of the skull through the vertex without intravenous contrast. COMPARISON:  CT head dated Dec 11, 2012. FINDINGS: Brain: No evidence of acute infarction, hemorrhage, hydrocephalus, extra-axial collection or mass lesion/mass effect. Stable cerebral atrophy and chronic microvascular ischemic changes. Vascular: No hyperdense vessel or unexpected calcification. Skull: Normal. Negative for fracture or focal lesion. Sinuses/Orbits: No acute finding. Other: None. IMPRESSION: 1.  No acute intracranial abnormality. Electronically Signed   By: Titus Dubin M.D.   On: 11/21/2017 16:02   Mr Brain Wo Contrast  Result Date: 11/22/2017 CLINICAL DATA:  Altered level of consciousness. Liver failure cirrhosis EXAM: MRI HEAD WITHOUT CONTRAST TECHNIQUE: Multiplanar, multiecho pulse sequences of the brain and surrounding  structures were obtained without intravenous contrast. COMPARISON:  CT head 11/21/2017 FINDINGS: Brain: Moderate atrophy.  Ventricular enlargement consistent with atrophy. Negative for acute infarct. Minimal chronic changes in the white matter. Negative for hemorrhage, mass, or fluid collection Vascular: Normal arterial flow voids Skull and upper cervical spine: Negative Sinuses/Orbits: Negative Other: None IMPRESSION: Moderate atrophy.  No acute intracranial abnormality. Electronically Signed   By: Franchot Gallo M.D.   On: 11/22/2017 10:39   Ct Abdomen Pelvis W Contrast  Result Date: 11/21/2017 CLINICAL DATA:  Near syncope, jaundice, fall. History of liver failure. EXAM: CT ABDOMEN AND PELVIS WITH CONTRAST TECHNIQUE: Multidetector CT imaging of the abdomen and pelvis was performed using the standard protocol following bolus administration of intravenous contrast. CONTRAST:  166mL ISOVUE-300 IOPAMIDOL (ISOVUE-300) INJECTION 61% COMPARISON:  Right upper quadrant ultrasound dated 09/30/2017 FINDINGS: Lower chest: Trace left pleural effusion. Hepatobiliary: Cirrhosis.  No focal hepatic lesion is seen. Cholelithiasis, without associated inflammatory changes. No intrahepatic or extrahepatic ductal dilatation. Pancreas: Mild fluid/ascites along the pancreatic tail (series 2/image 30), nonspecific. No pancreatic ductal dilatation or atrophy. Spleen: Spleen is normal in size. Adrenals/Urinary Tract: Adrenal glands are within normal limits. Kidneys are within normal limits. No hydronephrosis. Bladder is underdistended but unremarkable. Stomach/Bowel: Stomach is within normal limits. No evidence of bowel obstruction. Normal appendix (series 2/image 45). Mild sigmoid diverticulosis, without evidence of diverticulitis. Vascular/Lymphatic: No evidence of abdominal aortic aneurysm. Atherosclerotic calcifications of the abdominal aorta and branch vessels. Dominant splenorenal shunt.  Diminutive portal vein, patent. No  suspicious abdominopelvic lymphadenopathy. Reproductive: Prostate is unremarkable. Other: Small volume abdominopelvic ascites, predominantly perihepatic and perisplenic. Fat/fluid in the left inguinal canal. Musculoskeletal: Visualized osseous structures are within normal limits. IMPRESSION: Mild fluid/ascites along the pancreatic tail, nonspecific. Mild acute pancreatitis is not excluded. However, this may also be related to the patient's abdominopelvic ascites. Cirrhosis. Portal vein is diminutive but patent. Dominant splenorenal shunt. Small volume abdominopelvic ascites. Cholelithiasis, without associated inflammatory changes. Trace left pleural effusion. Electronically Signed   By: Julian Hy M.D.   On: 11/21/2017 18:40   Ct Hip Left Wo Contrast  Result Date: 11/27/2017 CLINICAL DATA:  Hip pain chronic. Massive hip/thigh hematoma with worsening swelling today. EXAM: CT OF THE LEFT HIP WITHOUT CONTRAST TECHNIQUE: Multidetector CT imaging of the left hip was performed according to the standard protocol. Multiplanar CT image reconstructions were also generated. COMPARISON:  Pelvic CT and left hip radiographs 11/21/2017. FINDINGS: Bones/Joint/Cartilage Examination includes the left hemipelvis and most of the left thigh. Portions of the right thigh are included on the coronal bone windows. No evidence of acute fracture or dislocation. There is no evidence of femoral head avascular necrosis. Mild degenerative changes are present at the hips, sacroiliac joints and symphysis pubis. No large hip joint effusion. Ligaments Not relevant for exam/indication. Muscles and Tendons No definite focal muscular abnormality. Soft tissues There is a large hematoma anteriorly in the mid left thigh. This abuts the rectus femoris muscle and measures approximately 11.9 x 7.3 x 11.7 cm. There is a fluid-fluid level in this collection, consistent with hematocrit effect. There is diffuse edema throughout the surrounding  subcutaneous fat which has increased from the prior pelvic CT. Fluid extends into the left inguinal canal as before. There is mild pelvic ascites. Scattered vascular calcifications are present. IMPRESSION: 1. Large hematoma anteriorly in the mid left thigh with associated hematocrit effect. This appears superficial to the rectus femoris muscle. Increasing edema throughout the surrounding subcutaneous fat. 2. No evidence of acute fracture or dislocation. Electronically Signed   By: Richardean Sale M.D.   On: 11/27/2017 12:47  Dg Chest Port 1 View  Result Date: 11/24/2017 CLINICAL DATA:  Respiratory failure, shortness of breath, cirrhosis, former smoker. EXAM: PORTABLE CHEST 1 VIEW COMPARISON:  Portable chest x-ray of Nov 24, 2017 FINDINGS: The lungs are mildly hypoinflated. The lung markings are coarse in the infrahilar regions. There is no alveolar infiltrate or pleural effusion. The heart is top-normal in size. The pulmonary vascularity is normal. The bony thorax exhibits no acute abnormality. IMPRESSION: Interval improvement in the appearance of the pulmonary interstitium suggests decreased interstitial edema. No alveolar pneumonia nor significant pleural effusion. Electronically Signed   By: David  Martinique M.D.   On: 11/24/2017 09:13   Dg Hip Unilat With Pelvis 2-3 Views Left  Result Date: 11/21/2017 CLINICAL DATA:  Pain following fall EXAM: DG HIP (WITH OR WITHOUT PELVIS) 2-3V LEFT COMPARISON:  None. FINDINGS: Frontal pelvis as well as frontal and lateral left hip images were obtained. No fracture or dislocation. There is slight symmetric narrowing of both hip joints. No erosive change. IMPRESSION: No fracture or dislocation. There is slight symmetric narrowing of both hip joints. Electronically Signed   By: Lowella Grip III M.D.   On: 11/21/2017 14:59    Time Spent : 53min   Domenic Polite M.D on 11/28/2017 at 10:32 AM  Between 7am to 7pm - Page via [email or web address] - password [facility]

## 2017-11-28 NOTE — Progress Notes (Signed)
Pharmacy Antibiotic Note  Logan Patrick is a 67 y.o. male admitted on 11/21/2017 with cellulitis.  Pharmacy has been consulted for Vancomycin dosing.  Patient with L-thigh hematoma s/p hemorrhagic shock now with cellulitis. WBC up today at 12.4 but remains afebrile. SCr 1.09 with normalized CrCl ~68 mL/min. UOP 0.6 mL/min on IV Lasix. SCr was 1.54 2 days ago so will watch carefully.   Plan: Vancomycin 1g IV every 12 hours Low threshold to reduce if SCr bumps Monitor response, renal function, and clinical status.  Follow-up ability to change to oral and length of therapy  Height: 5' 10.5" (179.1 cm) Weight: 280 lb 3.2 oz (127.1 kg) IBW/kg (Calculated) : 74.15  Temp (24hrs), Avg:98.5 F (36.9 C), Min:98.1 F (36.7 C), Max:99 F (37.2 C)  Recent Labs  Lab 11/21/17 1845  11/24/17 0404  11/25/17 0445 11/25/17 2045 11/26/17 0408 11/26/17 1512 11/27/17 0437 11/28/17 0507  WBC  --    < > 12.3*   < > 8.1 9.9 7.8 10.3 9.4 12.4*  CREATININE  --    < > 2.34*  --  1.99*  --  1.54*  --  1.17 1.09  LATICACIDVEN 3.23*  --   --   --   --   --   --   --   --   --    < > = values in this interval not displayed.    Estimated Creatinine Clearance: 90 mL/min (by C-G formula based on SCr of 1.09 mg/dL).    No Known Allergies  Antimicrobials this admission: Vanc 5/6 >>  Dose adjustments this admission:   Microbiology results: 4/29 Bldx2 - neg 4/30 MRSA pcr neg   Thank you for allowing pharmacy to be a part of this patient's care.  Sloan Leiter, PharmD, BCPS, BCCCP Clinical Pharmacist Clinical phone 11/28/2017 until 3:30PM [phone] After hours, please call [phone] 11/28/2017 8:57 AM

## 2017-11-29 LAB — COMPREHENSIVE METABOLIC PANEL
ALK PHOS: 84 U/L (ref 38–126)
ALT: 27 U/L (ref 17–63)
ANION GAP: 12 (ref 5–15)
AST: 58 U/L — ABNORMAL HIGH (ref 15–41)
Albumin: 2.2 g/dL — ABNORMAL LOW (ref 3.5–5.0)
BILIRUBIN TOTAL: 18 mg/dL — AB (ref 0.3–1.2)
BUN: 38 mg/dL — ABNORMAL HIGH (ref 6–20)
CALCIUM: 8.2 mg/dL — AB (ref 8.9–10.3)
CO2: 25 mmol/L (ref 22–32)
Chloride: 94 mmol/L — ABNORMAL LOW (ref 101–111)
Creatinine, Ser: 1.44 mg/dL — ABNORMAL HIGH (ref 0.61–1.24)
GFR calc non Af Amer: 49 mL/min — ABNORMAL LOW (ref >60)
GFR, EST AFRICAN AMERICAN: 57 mL/min — AB (ref >60)
Glucose, Bld: 100 mg/dL — ABNORMAL HIGH (ref 65–99)
Potassium: 3.5 mmol/L (ref 3.5–5.1)
SODIUM: 131 mmol/L — AB (ref 135–145)
Total Protein: 5.4 g/dL — ABNORMAL LOW (ref 6.5–8.1)

## 2017-11-29 LAB — CBC
HCT: 22.7 % — ABNORMAL LOW (ref 39.0–52.0)
HCT: 24.3 % — ABNORMAL LOW (ref 39.0–52.0)
HEMOGLOBIN: 7.4 g/dL — AB (ref 13.0–17.0)
Hemoglobin: 8.1 g/dL — ABNORMAL LOW (ref 13.0–17.0)
MCH: 31.4 pg (ref 26.0–34.0)
MCH: 32.3 pg (ref 26.0–34.0)
MCHC: 32.6 g/dL (ref 30.0–36.0)
MCHC: 33.3 g/dL (ref 30.0–36.0)
MCV: 96.2 fL (ref 78.0–100.0)
MCV: 96.8 fL (ref 78.0–100.0)
Platelets: 83 10*3/uL — ABNORMAL LOW (ref 150–400)
Platelets: 104 10*3/uL — ABNORMAL LOW (ref 150–400)
RBC: 2.36 MIL/uL — AB (ref 4.22–5.81)
RBC: 2.51 MIL/uL — ABNORMAL LOW (ref 4.22–5.81)
RDW: 20.8 % — ABNORMAL HIGH (ref 11.5–15.5)
RDW: 20.8 % — AB (ref 11.5–15.5)
WBC: 15.5 10*3/uL — ABNORMAL HIGH (ref 4.0–10.5)
WBC: 22.1 10*3/uL — ABNORMAL HIGH (ref 4.0–10.5)

## 2017-11-29 LAB — BILIRUBIN, FRACTIONATED(TOT/DIR/INDIR)
BILIRUBIN DIRECT: 5.8 mg/dL — AB (ref 0.1–0.5)
BILIRUBIN INDIRECT: 12 mg/dL — AB (ref 0.3–0.9)
BILIRUBIN TOTAL: 17.8 mg/dL — AB (ref 0.3–1.2)

## 2017-11-29 LAB — PROTIME-INR
INR: 2.3
PROTHROMBIN TIME: 25.1 s — AB (ref 11.4–15.2)

## 2017-11-29 LAB — PREPARE RBC (CROSSMATCH)

## 2017-11-29 MED ORDER — FUROSEMIDE 10 MG/ML IJ SOLN
20.0000 mg | Freq: Once | INTRAMUSCULAR | Status: DC
  Filled 2017-11-29: qty 2

## 2017-11-29 MED ORDER — SODIUM CHLORIDE 0.9 % IV SOLN: Freq: Once | INTRAVENOUS | Status: DC

## 2017-11-29 NOTE — Progress Notes (Addendum)
Patient has only had 100 ml of Urine output since 7 am this morning.   Most recent BP 90/53, patient is lightheaded/dizzy feeling but has been sitting up in chair for a few hours.  Paged MD  MD returned call- will place order for blood.   Will also place order for lasix after blood to be given only if SBP is above 100

## 2017-11-29 NOTE — Evaluation (Signed)
Occupational Therapy Evaluation Patient Details Name: Logan Patrick MRN: [id number] DOB: 20-Jul-1951 Today's Date: 11/29/2017    History of Present Illness Pt is a 67 y.o. male admitted 11/21/17 after falling at work noted to have hemorrhagic shock secondary to massive left thigh hematoma secondary to coagulopathy and thrombocytopenia from advanced liver cirrhosis. Pt sufferred a syncopal vs seizure in ED that resolved with IVF resucitation. MRI shows no acute intracranial abnormality. Pt also with hepatic failure in setting of alcoholic cirrhosis. PMH includes liver failure, HTN.    Clinical Impression   PTA, pt was independent with ADL and functional mobility and working full time running 2 restaurants. Pt currently requiring mod assist for LB ADL, mod assist for toileting hygiene, min assist for toilet transfers, and min guard assist for standing grooming tasks. Pt presenting with significantly limited activity tolerance for ADL participation today limiting his ability to complete tasks at PLOF. He is additionally limited by L thigh pain. Pt would benefit from continued OT services while admitted to improve independence with ADL and functional mobility. Pt encouraged to be OOB as much as possible with assist from staff.     Follow Up Recommendations  Home health OT;Supervision/Assistance - 24 hour    Equipment Recommendations  Tub/shower seat;3 in 1 bedside commode    Recommendations for Other Services       Precautions / Restrictions Precautions Precautions: Fall Precaution Comments: L thigh hematoma Restrictions Weight Bearing Restrictions: No      Mobility Bed Mobility Overal bed mobility: Needs Assistance Bed Mobility: Supine to Sit;Sit to Supine     Supine to sit: Mod assist Sit to supine: Mod assist   General bed mobility comments: Assist for LLE  Transfers Overall transfer level: Needs assistance Equipment used: Rolling walker (2 wheeled) Transfers: Sit to/from  Stand Sit to Stand: Min assist         General transfer comment: Min assist for safety.     Balance Overall balance assessment: Needs assistance Sitting-balance support: No upper extremity supported Sitting balance-Leahy Scale: Fair     Standing balance support: Bilateral upper extremity supported;No upper extremity supported;During functional activity Standing balance-Leahy Scale: Fair Standing balance comment: Statically able to stand without UB support.                            ADL either performed or assessed with clinical judgement   ADL Overall ADL's : Needs assistance/impaired Eating/Feeding: Set up;Sitting   Grooming: Min guard;Standing   Upper Body Bathing: Minimal assistance;Sitting   Lower Body Bathing: Moderate assistance;Sit to/from stand   Upper Body Dressing : Minimal assistance;Sitting   Lower Body Dressing: Moderate assistance;Sit to/from stand   Toilet Transfer: Minimal assistance;Ambulation;RW Toilet Transfer Details (indicate cue type and reason): short, shuffling steps Toileting- Clothing Manipulation and Hygiene: Moderate assistance;Sit to/from stand       Functional mobility during ADLs: Minimal assistance;Rolling walker General ADL Comments: Pt with decreased activity tolerance for ADL participation.      Vision Baseline Vision/History: Wears glasses Wears Glasses: At all times Patient Visual Report: No change from baseline Vision Assessment?: No apparent visual deficits     Perception     Praxis      Pertinent Vitals/Pain Pain Assessment: Faces Pain Score: 5  Faces Pain Scale: Hurts even more Pain Location: L thigh with bed mobility Pain Descriptors / Indicators: Sore Pain Intervention(s): Limited activity within patient's tolerance;Monitored during session;Repositioned     Hand Dominance  Extremity/Trunk Assessment Upper Extremity Assessment Upper Extremity Assessment: Overall WFL for tasks assessed   Lower  Extremity Assessment Lower Extremity Assessment: Defer to PT evaluation;LLE deficits/detail LLE Deficits / Details: significant hematoma   Cervical / Trunk Assessment Cervical / Trunk Assessment: Normal   Communication Communication Communication: No difficulties   Cognition Arousal/Alertness: Awake/alert Behavior During Therapy: WFL for tasks assessed/performed Overall Cognitive Status: Within Functional Limits for tasks assessed                                     General Comments  BLE edema and significant L thigh hematoma    Exercises     Shoulder Instructions      Home Living Family/patient expects to be discharged to:: Private residence Living Arrangements: Alone Available Help at Discharge: Family;Friend(s);Available PRN/intermittently Type of Home: House Home Access: Stairs to enter [employer] of Steps: 2 Entrance Stairs-Rails: Right;Left Home Layout: One level     Bathroom Shower/Tub: Occupational psychologist: Handicapped height     Home Equipment: None   Additional Comments: Ex-wife and children live nearby; pt reports they can assist if needed      Prior Functioning/Environment Level of Independence: Independent        Comments: Works full-time in [employer]; owns 2 restaurants        OT Problem List: Decreased strength;Decreased range of motion;Decreased activity tolerance;Impaired balance (sitting and/or standing);Decreased safety awareness;Decreased knowledge of use of DME or AE;Decreased knowledge of precautions;Pain      OT Treatment/Interventions: Self-care/ADL training;Therapeutic exercise;Energy conservation;DME and/or AE instruction;Therapeutic activities;Patient/family education;Balance training    OT Goals(Current goals can be found in the care plan section) Acute Rehab OT Goals Patient Stated Goal: Return home and back to work OT Goal Formulation: With patient Time For Goal Achievement:  12/13/17 Potential to Achieve Goals: Good ADL Goals Pt Will Perform Grooming: with modified independence;standing Pt Will Perform Lower Body Bathing: with min assist;with adaptive equipment;sit to/from stand Pt Will Perform Lower Body Dressing: with min assist;sit to/from stand;with adaptive equipment Pt Will Transfer to Toilet: with modified independence;ambulating;bedside commode(BSC over toilet) Pt Will Perform Toileting - Clothing Manipulation and hygiene: with min assist;sit to/from stand Pt Will Perform Tub/Shower Transfer: Shower transfer;3 in 1;rolling walker;ambulating;shower seat  OT Frequency: Min 2X/week   Barriers to D/C:            Co-evaluation              AM-PAC PT "6 Clicks" Daily Activity     Outcome Measure Help from another person eating meals?: None Help from another person taking care of personal grooming?: A Little Help from another person toileting, which includes using toliet, bedpan, or urinal?: A Lot Help from another person bathing (including washing, rinsing, drying)?: A Lot Help from another person to put on and taking off regular upper body clothing?: A Little Help from another person to put on and taking off regular lower body clothing?: A Lot 6 Click Score: 16   End of Session Equipment Utilized During Treatment: Rolling walker  Activity Tolerance: Patient tolerated treatment well Patient left: in bed;with call bell/phone within reach;with bed alarm set  OT Visit Diagnosis: Other abnormalities of gait and mobility (R26.89);Pain Pain - Right/Left: Left Pain - part of body: Leg                Time: 2440-1027 OT Time Calculation (min): 22 min  Charges:  OT General Charges $OT Visit: 1 Visit OT Evaluation $OT Eval Moderate Complexity: 1 Mod G-Codes:     Norman Herrlich, MS OTR/L  Pager: [place] [facility] [place] 11/29/2017, 2:56 PM

## 2017-11-29 NOTE — Progress Notes (Signed)
Physical Therapy Treatment Patient Details Name: GIAN YBARRA MRN: [id number] DOB: February 21, 1951 Today's Date: 11/29/2017    History of Present Illness Pt is a 67 y.o. male admitted 11/21/17 after falling at work noted to have hemorrhagic shock secondary to massive left thigh hematoma secondary to coagulopathy and thrombocytopenia from advanced liver cirrhosis. Pt sufferred a syncopal vs seizure in ED that resolved with IVF resucitation. MRI shows no acute intracranial abnormality. Pt also with hepatic failure in setting of alcoholic cirrhosis. PMH includes liver failure, HTN.     PT Comments    Pt pleasant and willing to participate with therapy. Session focused on bed mobility, transfers, and ambulation. Pt reports baseline dizziness (described as "swimmy headed") that did not become worse with activity today. Ice applied to L thigh for pain related to hematoma. DME recommendations edited to include 3 in 1 BSC and bariatric RW.  Vitals at rest: BP - 99/51, HR - 114 bpm, SpO2 - 98% Vitals with activity BP - 94/69, HR - 128. SpO2 - 93%    Follow Up Recommendations  Home health PT;Supervision - Intermittent     Equipment Recommendations  Rolling walker with 5" wheels;3in1 (PT)(Bariatric RW)    Recommendations for Other Services       Precautions / Restrictions Precautions Precautions: Fall Precaution Comments: L thigh hematoma Restrictions Weight Bearing Restrictions: No    Mobility  Bed Mobility Overal bed mobility: Needs Assistance Bed Mobility: Supine to Sit     Supine to sit: Min assist;HOB elevated     General bed mobility comments: Pt cued to utilize gait belt around distal LLE to allow UE to move LLE off bed. Pt also required cues to scoot hips to edge of bed in preparation for standing. Pt required min A to reposition LLE on ground when sitting EOB. Increased time and effort required to complete bed mobility  Transfers Overall transfer level: Needs  assistance Equipment used: Rolling walker (2 wheeled) Transfers: Sit to/from Stand Sit to Stand: Min assist;Min guard         General transfer comment: Pt performed sit<> stand x2 with RW with min A required during 1st attempt and improved technique (increased forward lean) allowing min guard on attempt #2. Pt required cueing for hand placement and technique for standing and sitting. BP taken in supine with HOB elevated: 99/51 and in standing: 94/69  Ambulation/Gait Ambulation/Gait assistance: Min guard;+2 safety/equipment(Chair follow) Ambulation Distance (Feet): 26 Feet Assistive device: Rolling walker (2 wheeled) Gait Pattern/deviations: Step-to pattern;Wide base of support;Decreased step length - right;Decreased step length - left;Decreased weight shift to left Gait velocity: Decreased Gait velocity interpretation: <1.31 ft/sec, indicative of household ambulator General Gait Details: Slow gait with wide BOS secondary to BLE swelling. Pt ambulated 14 ft in room and asked for seated rest break. Pt then ambulated 28 ft in hallway. SpO2 checked after 1st gait trail and found to be 93% on RA. During gait, pt had to be continually redirected towards the R as he repeatedly veered L.   Stairs             Wheelchair Mobility    Modified Rankin (Stroke Patients Only)       Balance Overall balance assessment: Needs assistance Sitting-balance support: No upper extremity supported Sitting balance-Leahy Scale: Fair     Standing balance support: Bilateral upper extremity supported Standing balance-Leahy Scale: Poor Standing balance comment: Pt reliant on walker and requires min guard for standing  Cognition Arousal/Alertness: Awake/alert Behavior During Therapy: WFL for tasks assessed/performed Overall Cognitive Status: Within Functional Limits for tasks assessed                                        Exercises       General Comments General comments (skin integrity, edema, etc.): BLE edema and significant L thigh hematoma      Pertinent Vitals/Pain Pain Assessment: 0-10 Pain Score: 5  Pain Location: L thigh Pain Descriptors / Indicators: Sore Pain Intervention(s): Limited activity within patient's tolerance;Monitored during session;Ice applied    Home Living                      Prior Function            PT Goals (current goals can now be found in the care plan section) Acute Rehab PT Goals Patient Stated Goal: Return home and back to work PT Goal Formulation: With patient Time For Goal Achievement: 12/08/17 Potential to Achieve Goals: Good Progress towards PT goals: Progressing toward goals    Frequency    Min 3X/week      PT Plan Current plan remains appropriate    Co-evaluation              AM-PAC PT "6 Clicks" Daily Activity  Outcome Measure  Difficulty turning over in bed (including adjusting bedclothes, sheets and blankets)?: A Little Difficulty moving from lying on back to sitting on the side of the bed? : A Lot Difficulty sitting down on and standing up from a chair with arms (e.g., wheelchair, bedside commode, etc,.)?: Unable Help needed moving to and from a bed to chair (including a wheelchair)?: A Little Help needed walking in hospital room?: A Little Help needed climbing 3-5 steps with a railing? : A Lot 6 Click Score: 14    End of Session Equipment Utilized During Treatment: Gait belt Activity Tolerance: Patient tolerated treatment well Patient left: in chair;with call bell/phone within reach;with family/visitor present;with chair alarm set Nurse Communication: Mobility status PT Visit Diagnosis: Other abnormalities of gait and mobility (R26.89);Unsteadiness on feet (R26.81)     Time: 1540-0867 PT Time Calculation (min) (ACUTE ONLY): 35 min  Charges:  $Gait Training: 8-22 mins $Therapeutic Activity: 8-22 mins                    G Codes:        Gabe Coleton Woon, SPT   [name] 11/29/2017, 2:41 PM

## 2017-11-29 NOTE — Progress Notes (Signed)
Orthopedic Trauma Service Progress Note   Patient ID: Logan Patrick MRN: [id number] DOB/AGE: [birth year] 67 y.o.  Subjective:  No specific complaints  Sounds like pt is getting up very minimally and spending nearly all hours in bed  Has not been using ice to L thigh   Mild L thigh pain  No numbness or tingling    Review of Systems  Respiratory: Negative for shortness of breath.   Cardiovascular: Negative for chest pain.  Gastrointestinal: Negative for abdominal pain, nausea and vomiting.  Neurological: Negative for tingling.    Objective:   VITALS:   Vitals:   11/29/17 0009 11/29/17 0428 11/29/17 0743 11/29/17 0906  BP: (!) 106/58 (!) 95/58 (!) 105/54 (!) 102/48  Pulse: (!) 111 (!) 107 (!) 107 (!) 111  Resp: 18 18    Temp: 98.9 F (37.2 C) 98.6 F (37 C) 98.2 F (36.8 C)   TempSrc: Oral Oral Oral   SpO2: 93% 93% 93%   Weight:  126.6 kg (279 lb)    Height:        Estimated body mass index is 39.47 kg/m as calculated from the following:   Height as of this encounter: 5' 10.5" (1.791 m).   Weight as of this encounter: 126.6 kg (279 lb).   Intake/Output      05/06 0701 - 05/07 0700 05/07 0701 - 05/08 0700   P.O. 960 120   Blood 315    IV Piggyback 400    Total Intake(mL/kg) 1675 (13.2) 120 (0.9)   Urine (mL/kg/hr) 650 (0.2)    Stool 0    Total Output 650    Net +1025 +120        Stool Occurrence 3 x      LABS  Results for orders placed or performed during the hospital encounter of 11/21/17 (from the past 24 hour(s))  Hemoglobin and hematocrit, blood     Status: Abnormal   Collection Time: 11/28/17  8:17 PM  Result Value Ref Range   Hemoglobin 8.0 (L) 13.0 - 17.0 g/dL   HCT 24.7 (L) 39.0 - 52.0 %  Comprehensive metabolic panel     Status: Abnormal   Collection Time: 11/29/17  5:26 AM  Result Value Ref Range   Sodium 131 (L) 135 - 145 mmol/L   Potassium 3.5 3.5 - 5.1 mmol/L   Chloride 94 (L) 101 - 111 mmol/L   CO2 25 22 -  32 mmol/L   Glucose, Bld 100 (H) 65 - 99 mg/dL   BUN 38 (H) 6 - 20 mg/dL   Creatinine, Ser 1.44 (H) 0.61 - 1.24 mg/dL   Calcium 8.2 (L) 8.9 - 10.3 mg/dL   Total Protein 5.4 (L) 6.5 - 8.1 g/dL   Albumin 2.2 (L) 3.5 - 5.0 g/dL   AST 58 (H) 15 - 41 U/L   ALT 27 17 - 63 U/L   Alkaline Phosphatase 84 38 - 126 U/L   Total Bilirubin 18.0 (H) 0.3 - 1.2 mg/dL   GFR calc non Af Amer 49 (L) >60 mL/min   GFR calc Af Amer 57 (L) >60 mL/min   Anion gap 12 5 - 15  CBC     Status: Abnormal   Collection Time: 11/29/17  5:26 AM  Result Value Ref Range   WBC 15.5 (H) 4.0 - 10.5 K/uL   RBC 2.36 (L) 4.22 - 5.81 MIL/uL   Hemoglobin 7.4 (L) 13.0 - 17.0 g/dL   HCT 22.7 (L) 39.0 - 52.0 %  MCV 96.2 78.0 - 100.0 fL   MCH 31.4 26.0 - 34.0 pg   MCHC 32.6 30.0 - 36.0 g/dL   RDW 20.8 (H) 11.5 - 15.5 %   Platelets 83 (L) 150 - 400 K/uL  Protime-INR     Status: Abnormal   Collection Time: 11/29/17  5:26 AM  Result Value Ref Range   Prothrombin Time 25.1 (H) 11.4 - 15.2 seconds   INR 2.30   Bilirubin, fractionated(tot/dir/indir)     Status: Abnormal   Collection Time: 11/29/17  7:43 AM  Result Value Ref Range   Total Bilirubin 17.8 (H) 0.3 - 1.2 mg/dL   Bilirubin, Direct 5.8 (H) 0.1 - 0.5 mg/dL   Indirect Bilirubin 12.0 (H) 0.3 - 0.9 mg/dL     PHYSICAL EXAM:   Gen: in bed, NAD, pleasant  Ext:       Left Lower extremity   Increased ecchymosis to L proximal thigh, extending posteriorly over the glute  Mild tenderness  Proximal thigh is firm   Abrasions to anterolateral thigh are stable   Asymmetric swelling of L leg compared to contralateral side  Pitting edema  Distal motor and sensory functions intact      Feet              Head     Assessment/Plan:     Principal Problem:   Syncope, near Active Problems:   Anemia   Hypokalemia   Hypotension   Tachycardia   Thrombocytopenia (HCC)   Seizures (HCC)   Elevated troponin   Syncope   Hemorrhagic shock (HCC)   AKI (acute kidney  injury) (HCC)   Thigh hematoma, left, initial encounter   Acute blood loss anemia   Hematoma of left hip   Anti-infectives (From admission, onward)   Start     Dose/Rate Route Frequency Ordered Stop   11/28/17 1000  vancomycin (VANCOCIN) IVPB 1000 mg/200 mL premix     1,000 mg 200 mL/hr over 60 Minutes Intravenous Every 12 hours 11/28/17 0903     11/21/17 2315  rifaximin (XIFAXAN) tablet 550 mg     550 mg Oral 2 times daily 11/21/17 2303     11/21/17 1745  cefTRIAXone (ROCEPHIN) 2 g in sodium chloride 0.9 % 100 mL IVPB     2 g 200 mL/hr over 30 Minutes Intravenous  Once 11/21/17 1732 11/21/17 1901    .  POD/HD#: 67  67 y/o male s/p fall    - fall   -L thigh hematoma                          Continue with supportive care   Think he would benefit from ice and TED hose    Ideally I would like to get compression to the proximal thigh and this could be done with an ace wrapped in a hip spica fashion but I do not think he would tolerate this well. Alternatively pt could get a pair of compression shorts as well.    Pt really needs to get up as much as possible as well               Ok to be up with assistance             WBAT B LEx             Continue with coagulopathy correction               pts asymmetric  swelling is a bit concerning    Likely has some extrinsic venous compression form his thigh swelling, in addition to his immobility     Would like for him to wear TED hose and also use SCDs   Will also check doppler as he has been very immobile    Continue to monitor soft tissue for complications      - Dispo:             Per medical service              Jari Pigg, PA-C [facility] [phone] Levi Aland (C) 11/29/2017, 10:46 AM

## 2017-11-29 NOTE — Progress Notes (Signed)
@IPLOG @        PROGRESS NOTE                                                                                                                                                                                                             Patient Demographics:    Logan Patrick, is a 67 y.o. male, DOB - June 07, 1951, TWS:[id number]  Admit date - 11/21/2017   Admitting Physician Jani Gravel, MD  Outpatient Primary MD for the patient is [name], MD  LOS - 8  Chief Complaint  Patient presents with  . Fall       Brief Narrative  - Logan Patrick  is a 67 y.o. male, w Etoh cirrhosis , hypertension who presents with c/o bending over at work and feeling light headed like he was going to have syncope and lost his  Balance.  Pt was brought into ED by ex- wife, In ED, pt apparently had episode of syncope. -noted to have hemorrhagic shock secondary to massive left thigh hematoma secondary to coagulopathy and thrombocytopenia from advanced liver cirrhosis. -required large amount of blood and FFP in ICU -5/4 Tx to floor -Continues to intermittently require blood products I.e. PRBC and FFP although frequency of requirement is a little better -Has been getting IV Lasix intermittently as well -Orthopedics asked to reevaluate due to worsening leukocytosis and risk of necrosis/infection    Subjective:  -continues to have left high pain and discomfort, a little better compared to yesterday -No fevers -Breathing okay   Assessment  & Plan :    1.  Massive left thigh hematoma/hemorrhagic shock -Following mechanical fall, worsened by auto anticoagulation and chronic thrombocytopenia from underlying cirrhosis -Status post 16 units of FFP, vitamin K and 7 units of PRBC so far -Orthopedics Dr. Marcelino Scot consulted as well, recommended to treat coagulopathy -case d/w VVS as well on 4/30 -hemoglobin was more stable in past 4days, now 7.4, will repeat, PT/INR fluctuates but cannot be reversed for long due to  advanced cirrhosis -increased swelling for 24-48 hours now, CT he femur shows large hematoma, no evidence of abscess at this time -WBC count is worsening, added IV vancomycin -Asked orthopedics to reevaluate due to risk of infection/necrosis -Elevated left leg as possible -I also d/w Hematology Dr.Mohamed over the weekend, recommended to continue supportive care as underlying cirrhosis/liver dz cannot be reversed for long -physical therapy following  2. Acute blood loss anemia -Secondary to massive left thigh hematoma as noted above -Status post 7 PRBCs so far, hemoglobin is  7.4now -Monitor CBC daily, recheck CBC this afternoon  3.  Alcoholic cirrhosis with auto anticoagulation and ascites along with chronic thrombocytopenia.  Quit alcohol several years ago, supportive care - Continue oral Xifaxan and lactulose for now. -with profound thirst spacing, he has been given multiple doses of IV Lasix for last 48 hours -hyperbilirubinemia worsened from baseline cirrhosis, secondary to large amount of blood product use and high degree of hemolysis, most of bili is indirect  -Hold Lasix today, BP more soft and bump in creatinine -with significant third spacing diuresis as BP tolerates   3.  Syncope on admission -Likely secondary to blood loss anemia and orthostatic hypotension -MRI brain was done on admission which is unrevealing -EEG noted diffuse slowing  4.  Hypotension- due to blood loss related anemia,  - transfuse -blood pressure improved  5. ARF - due to #1 and #4 - transfuse, avoid hypotension and nephrotoxins, monitor. -improving  6. Hypomagnesemia - replaced, monitor.  7. Thrombocytopenia -Due to cirrhosis and consumption, improving  DVT prophylaxis: SCDs  Diet : Heart Healthy  Family Communication  : None  Code Status : Full  Disposition Plan  : will likely need at least another week in the hospital  Consults  :   Orthopedics,  PCCM D/w Hematology Dr.Mohamed  5/4   Procedures  :      DVT Prophylaxis  : None for now  Lab Results  Component Value Date   INR 2.30 11/29/2017   INR 1.99 11/28/2017   INR 2.16 11/27/2017     Lab Results  Component Value Date   PLT 83 (L) 11/29/2017    Inpatient Medications  Scheduled Meds: . feeding supplement (PRO-STAT SUGAR FREE 64)  30 mL Oral BID  . lactulose  30 g Oral BID  . pantoprazole  40 mg Oral BID  . polyethylene glycol  17 g Oral BID  . rifaximin  550 mg Oral BID   Continuous Infusions: . sodium chloride    . vancomycin 1,000 mg (11/29/17 0957)   PRN Meds:.diphenhydrAMINE, iopamidol, ipratropium-albuterol, morphine injection, ondansetron (ZOFRAN) IV, promethazine, sodium chloride, sodium chloride flush, traMADol  Antibiotics  :    Anti-infectives (From admission, onward)   Start     Dose/Rate Route Frequency Ordered Stop   11/28/17 1000  vancomycin (VANCOCIN) IVPB 1000 mg/200 mL premix     1,000 mg 200 mL/hr over 60 Minutes Intravenous Every 12 hours 11/28/17 0903     11/21/17 2315  rifaximin (XIFAXAN) tablet 550 mg     550 mg Oral 2 times daily 11/21/17 2303     11/21/17 1745  cefTRIAXone (ROCEPHIN) 2 g in sodium chloride 0.9 % 100 mL IVPB     2 g 200 mL/hr over 30 Minutes Intravenous  Once 11/21/17 1732 11/21/17 1901         Objective:   Vitals:   11/29/17 0428 11/29/17 0743 11/29/17 0906 11/29/17 1225  BP: (!) 95/58 (!) 105/54 (!) 102/48 (!) 106/52  Pulse: (!) 107 (!) 107 (!) 111 (!) 105  Resp: 18   18  Temp: 98.6 F (37 C) 98.2 F (36.8 C)  98.1 F (36.7 C)  TempSrc: Oral Oral  Oral  SpO2: 93% 93%  94%  Weight: 126.6 kg (279 lb)     Height:        Wt Readings from Last 3 Encounters:  11/29/17 126.6 kg (279 lb)  12/12/12 86.3 kg (190 lb 4.1 oz)     Intake/Output Summary (Last 24 hours)  at 11/29/2017 1326 Last data filed at 11/29/2017 0810 Gross per 24 hour  Intake 1435 ml  Output 450 ml  Net 985 ml     Physical Exam  Gen: Awake, Alert, Oriented X  3, obese male, profound icterus HEENT: PERRLA, Neck supple, positive icterus Lungs: decreased breath sounds at both bases CVS: S1-S2/regular rate rhythm Abd: soft, obese,, nondistended, bowel sounds present Extremities:  2+ lower extremity edema, massive left thigh hematoma with purplish discoloration of left upper thigh, scrotal edema, intact dorsalis pedis, distal sensations and intact capillary refill Skin: discoloration of left thigh as related to hematoma   Data Review:    CBC Recent Labs  Lab 11/25/17 0445  11/26/17 0408 11/26/17 1512 11/27/17 0437 11/28/17 0507 11/28/17 2017 11/29/17 0526  WBC 8.1   < > 7.8 10.3 9.4 12.4*  --  15.5*  HGB 6.6*   < > 8.3* 9.9* 8.4* 7.4* 8.0* 7.4*  HCT 19.1*   < > 24.5* 29.3* 25.5* 22.5* 24.7* 22.7*  PLT 66*   < > 65* 73* 75* 74*  --  83*  MCV 96.5   < > 93.2 94.2 95.1 97.4  --  96.2  MCH 33.3   < > 31.6 31.8 31.3 32.0  --  31.4  MCHC 34.6   < > 33.9 33.8 32.9 32.9  --  32.6  RDW 19.5*   < > 20.9* 21.2* 21.1* 21.3*  --  20.8*  LYMPHSABS 1.6  --   --   --   --   --   --   --   MONOABS 0.8  --   --   --   --   --   --   --   EOSABS 0.3  --   --   --   --   --   --   --   BASOSABS 0.0  --   --   --   --   --   --   --    < > = values in this interval not displayed.    Chemistries  Recent Labs  Lab 11/23/17 0603 11/24/17 0404 11/25/17 0445 11/26/17 0408 11/27/17 0437 11/28/17 0507 11/29/17 0526 11/29/17 0743  NA 134* 133* 135 140 136 136 131*  --   K 4.0 3.7 3.1* 2.8* 3.0* 3.7 3.5  --   CL 102 101 103 102 97* 98* 94*  --   CO2 20* 23 25 28 28 27 25   --   GLUCOSE 111* 98 93 103* 107* 84 100*  --   BUN 17 25* 28* 23* 23* 26* 38*  --   CREATININE 1.95* 2.34* 1.99* 1.54* 1.17 1.09 1.44*  --   CALCIUM 8.2* 8.4* 8.7* 9.1 8.9 8.7* 8.2*  --   MG 1.6* 1.7  --   --   --   --   --   --   AST  --   --   --  53*  --  54* 58*  --   ALT  --   --   --  27  --  28 27  --   ALKPHOS  --   --   --  125  --  98 84  --   BILITOT  --   --   --   12.0*  --  15.9* 18.0* 17.8*   ------------------------------------------------------------------------------------------------------------------ No results for input(s): CHOL, HDL, LDLCALC, TRIG, CHOLHDL, LDLDIRECT in the last 72 hours.  No results found for: HGBA1C ------------------------------------------------------------------------------------------------------------------  No results for input(s): TSH, T4TOTAL, T3FREE, THYROIDAB in the last 72 hours.  Invalid input(s): FREET3 ------------------------------------------------------------------------------------------------------------------ No results for input(s): VITAMINB12, FOLATE, FERRITIN, TIBC, IRON, RETICCTPCT in the last 72 hours.  Coagulation profile Recent Labs  Lab 11/25/17 0445 11/26/17 0408 11/27/17 0437 11/28/17 0507 11/29/17 0526  INR 2.17 1.90 2.16 1.99 2.30    No results for input(s): DDIMER in the last 72 hours.  Cardiac Enzymes No results for input(s): CKMB, TROPONINI, MYOGLOBIN in the last 168 hours.  Invalid input(s): CK ------------------------------------------------------------------------------------------------------------------ No results found for: BNP  Micro Results Recent Results (from the past 240 hour(s))  Culture, blood (routine x 2)     Status: None   Collection Time: 11/21/17  3:20 PM  Result Value Ref Range Status   Specimen Description   Final    BLOOD LEFT ANTECUBITAL Performed at [facility], [address] [place], [place] [address]    Special Requests   Final    BOTTLES DRAWN AEROBIC AND ANAEROBIC Blood Culture adequate volume Performed at [facility], [address], [place], [place] [address]    Culture   Final    NO GROWTH 5 DAYS Performed at [facility], [place] [address] [place], [place] [address]    Report Status 11/26/2017 FINAL  Final  Culture, blood (routine x 2)     Status: None   Collection Time: 11/21/17  3:40 PM  Result Value  Ref Range Status   Specimen Description   Final    BLOOD LEFT HAND Performed at [facility], [address] [place], [place] [address]    Special Requests   Final    BOTTLES DRAWN AEROBIC AND ANAEROBIC Blood Culture adequate volume Performed at [facility], [address] [place], [place] [address]    Culture   Final    NO GROWTH 5 DAYS Performed at [facility], [place] [address], [place], [place] [address]    Report Status 11/26/2017 FINAL  Final  MRSA PCR Screening     Status: None   Collection Time: 11/22/17  3:34 PM  Result Value Ref Range Status   MRSA by PCR NEGATIVE NEGATIVE Final    Comment:        The GeneXpert MRSA Assay (FDA approved for NASAL specimens only), is one component of a comprehensive MRSA colonization surveillance program. It is not intended to diagnose MRSA infection nor to guide or monitor treatment for MRSA infections. Performed at [facility], [place] [address] [place],  [address]     Radiology Reports Dg Chest 1 View  Result Date: 11/21/2017 CLINICAL DATA:  Near syncope with jaundice EXAM: CHEST  1 VIEW COMPARISON:  10/11/2017 FINDINGS: Possible trace left effusion. No focal consolidation. Stable cardiomediastinal silhouette with mild vascular congestion. Mild diffuse interstitial opacity compared to prior. No pneumothorax. IMPRESSION: 1. Probable tiny left effusion 2. Minimal vascular congestion. Mild diffuse increased interstitial opacity compared to prior, possible mild edema. Electronically Signed   By: Donavan Foil M.D.   On: 11/21/2017 18:38   Ct Head Wo Contrast  Result Date: 11/21/2017 CLINICAL DATA:  Altered mental status and seizure. EXAM: CT HEAD WITHOUT CONTRAST TECHNIQUE: Contiguous axial images were obtained from the base of the skull through the vertex without intravenous contrast. COMPARISON:  CT head dated Dec 11, 2012. FINDINGS: Brain: No evidence of acute infarction, hemorrhage,  hydrocephalus, extra-axial collection or mass lesion/mass effect. Stable cerebral  atrophy and chronic microvascular ischemic changes. Vascular: No hyperdense vessel or unexpected calcification. Skull: Normal. Negative for fracture or focal lesion. Sinuses/Orbits: No acute finding. Other: None. IMPRESSION: 1.  No acute intracranial abnormality. Electronically Signed   By: Titus Dubin M.D.   On: 11/21/2017 16:02   Mr Brain Wo Contrast  Result Date: 11/22/2017 CLINICAL DATA:  Altered level of consciousness. Liver failure cirrhosis EXAM: MRI HEAD WITHOUT CONTRAST TECHNIQUE: Multiplanar, multiecho pulse sequences of the brain and surrounding structures were obtained without intravenous contrast. COMPARISON:  CT head 11/21/2017 FINDINGS: Brain: Moderate atrophy. Ventricular enlargement consistent with atrophy. Negative for acute infarct. Minimal chronic changes in the white matter. Negative for hemorrhage, mass, or fluid collection Vascular: Normal arterial flow voids Skull and upper cervical spine: Negative Sinuses/Orbits: Negative Other: None IMPRESSION: Moderate atrophy.  No acute intracranial abnormality. Electronically Signed   By: Franchot Gallo M.D.   On: 11/22/2017 10:39   Ct Abdomen Pelvis W Contrast  Result Date: 11/21/2017 CLINICAL DATA:  Near syncope, jaundice, fall. History of liver failure. EXAM: CT ABDOMEN AND PELVIS WITH CONTRAST TECHNIQUE: Multidetector CT imaging of the abdomen and pelvis was performed using the standard protocol following bolus administration of intravenous contrast. CONTRAST:  159mL ISOVUE-300 IOPAMIDOL (ISOVUE-300) INJECTION 61% COMPARISON:  Right upper quadrant ultrasound dated 09/30/2017 FINDINGS: Lower chest: Trace left pleural effusion. Hepatobiliary: Cirrhosis.  No focal hepatic lesion is seen. Cholelithiasis, without associated inflammatory changes. No intrahepatic or extrahepatic ductal dilatation. Pancreas: Mild fluid/ascites along the pancreatic tail (series  2/image 30), nonspecific. No pancreatic ductal dilatation or atrophy. Spleen: Spleen is normal in size. Adrenals/Urinary Tract: Adrenal glands are within normal limits. Kidneys are within normal limits. No hydronephrosis. Bladder is underdistended but unremarkable. Stomach/Bowel: Stomach is within normal limits. No evidence of bowel obstruction. Normal appendix (series 2/image 45). Mild sigmoid diverticulosis, without evidence of diverticulitis. Vascular/Lymphatic: No evidence of abdominal aortic aneurysm. Atherosclerotic calcifications of the abdominal aorta and branch vessels. Dominant splenorenal shunt.  Diminutive portal vein, patent. No suspicious abdominopelvic lymphadenopathy. Reproductive: Prostate is unremarkable. Other: Small volume abdominopelvic ascites, predominantly perihepatic and perisplenic. Fat/fluid in the left inguinal canal. Musculoskeletal: Visualized osseous structures are within normal limits. IMPRESSION: Mild fluid/ascites along the pancreatic tail, nonspecific. Mild acute pancreatitis is not excluded. However, this may also be related to the patient's abdominopelvic ascites. Cirrhosis. Portal vein is diminutive but patent. Dominant splenorenal shunt. Small volume abdominopelvic ascites. Cholelithiasis, without associated inflammatory changes. Trace left pleural effusion. Electronically Signed   By: Julian Hy M.D.   On: 11/21/2017 18:40   Ct Hip Left Wo Contrast  Result Date: 11/27/2017 CLINICAL DATA:  Hip pain chronic. Massive hip/thigh hematoma with worsening swelling today. EXAM: CT OF THE LEFT HIP WITHOUT CONTRAST TECHNIQUE: Multidetector CT imaging of the left hip was performed according to the standard protocol. Multiplanar CT image reconstructions were also generated. COMPARISON:  Pelvic CT and left hip radiographs 11/21/2017. FINDINGS: Bones/Joint/Cartilage Examination includes the left hemipelvis and most of the left thigh. Portions of the right thigh are included on the  coronal bone windows. No evidence of acute fracture or dislocation. There is no evidence of femoral head avascular necrosis. Mild degenerative changes are present at the hips, sacroiliac joints and symphysis pubis. No large hip joint effusion. Ligaments Not relevant for exam/indication. Muscles and Tendons No definite focal muscular abnormality. Soft tissues There is a large hematoma anteriorly in the mid left thigh. This abuts the rectus femoris muscle and measures approximately 11.9 x 7.3 x 11.7 cm. There  is a fluid-fluid level in this collection, consistent with hematocrit effect. There is diffuse edema throughout the surrounding subcutaneous fat which has increased from the prior pelvic CT. Fluid extends into the left inguinal canal as before. There is mild pelvic ascites. Scattered vascular calcifications are present. IMPRESSION: 1. Large hematoma anteriorly in the mid left thigh with associated hematocrit effect. This appears superficial to the rectus femoris muscle. Increasing edema throughout the surrounding subcutaneous fat. 2. No evidence of acute fracture or dislocation. Electronically Signed   By: Richardean Sale M.D.   On: 11/27/2017 12:47   Dg Chest Port 1 View  Result Date: 11/24/2017 CLINICAL DATA:  Respiratory failure, shortness of breath, cirrhosis, former smoker. EXAM: PORTABLE CHEST 1 VIEW COMPARISON:  Portable chest x-ray of Nov 24, 2017 FINDINGS: The lungs are mildly hypoinflated. The lung markings are coarse in the infrahilar regions. There is no alveolar infiltrate or pleural effusion. The heart is top-normal in size. The pulmonary vascularity is normal. The bony thorax exhibits no acute abnormality. IMPRESSION: Interval improvement in the appearance of the pulmonary interstitium suggests decreased interstitial edema. No alveolar pneumonia nor significant pleural effusion. Electronically Signed   By: David  Martinique M.D.   On: 11/24/2017 09:13   Dg Hip Unilat With Pelvis 2-3 Views  Left  Result Date: 11/21/2017 CLINICAL DATA:  Pain following fall EXAM: DG HIP (WITH OR WITHOUT PELVIS) 2-3V LEFT COMPARISON:  None. FINDINGS: Frontal pelvis as well as frontal and lateral left hip images were obtained. No fracture or dislocation. There is slight symmetric narrowing of both hip joints. No erosive change. IMPRESSION: No fracture or dislocation. There is slight symmetric narrowing of both hip joints. Electronically Signed   By: Lowella Grip III M.D.   On: 11/21/2017 14:59    Time Spent : 57min   Domenic Polite M.D on 11/29/2017 at 1:26 PM  Between 7am to 7pm - Page via [email or web address] - password [facility]

## 2017-11-30 ENCOUNTER — Inpatient Hospital Stay (HOSPITAL_COMMUNITY): Payer: PPO

## 2017-11-30 DIAGNOSIS — K7290 Hepatic failure, unspecified without coma: Secondary | ICD-10-CM

## 2017-11-30 DIAGNOSIS — K7210 Chronic hepatic failure without coma: Secondary | ICD-10-CM

## 2017-11-30 DIAGNOSIS — M7989 Other specified soft tissue disorders: Secondary | ICD-10-CM

## 2017-11-30 LAB — PREPARE RBC (CROSSMATCH)

## 2017-11-30 LAB — HEMOGLOBIN AND HEMATOCRIT, BLOOD
HEMATOCRIT: 23.8 % — AB (ref 39.0–52.0)
Hemoglobin: 8 g/dL — ABNORMAL LOW (ref 13.0–17.0)

## 2017-11-30 LAB — CBC
HEMATOCRIT: 23 % — AB (ref 39.0–52.0)
HEMOGLOBIN: 7.7 g/dL — AB (ref 13.0–17.0)
MCH: 31.7 pg (ref 26.0–34.0)
MCHC: 33.5 g/dL (ref 30.0–36.0)
MCV: 94.7 fL (ref 78.0–100.0)
Platelets: 92 10*3/uL — ABNORMAL LOW (ref 150–400)
RBC: 2.43 MIL/uL — AB (ref 4.22–5.81)
RDW: 20.3 % — ABNORMAL HIGH (ref 11.5–15.5)
WBC: 15.4 10*3/uL — ABNORMAL HIGH (ref 4.0–10.5)

## 2017-11-30 LAB — COMPREHENSIVE METABOLIC PANEL
ALK PHOS: 86 U/L (ref 38–126)
ALT: 30 U/L (ref 17–63)
ANION GAP: 11 (ref 5–15)
AST: 74 U/L — ABNORMAL HIGH (ref 15–41)
Albumin: 2.1 g/dL — ABNORMAL LOW (ref 3.5–5.0)
BUN: 54 mg/dL — ABNORMAL HIGH (ref 6–20)
CALCIUM: 8 mg/dL — AB (ref 8.9–10.3)
CHLORIDE: 93 mmol/L — AB (ref 101–111)
CO2: 26 mmol/L (ref 22–32)
Creatinine, Ser: 2.06 mg/dL — ABNORMAL HIGH (ref 0.61–1.24)
GFR calc non Af Amer: 32 mL/min — ABNORMAL LOW (ref >60)
GFR, EST AFRICAN AMERICAN: 37 mL/min — AB (ref >60)
Glucose, Bld: 84 mg/dL (ref 65–99)
POTASSIUM: 3.7 mmol/L (ref 3.5–5.1)
SODIUM: 130 mmol/L — AB (ref 135–145)
Total Bilirubin: 18.4 mg/dL (ref 0.3–1.2)
Total Protein: 4.9 g/dL — ABNORMAL LOW (ref 6.5–8.1)

## 2017-11-30 LAB — VANCOMYCIN, TROUGH: VANCOMYCIN TR: 24 ug/mL — AB (ref 15–20)

## 2017-11-30 LAB — CBC WITH DIFFERENTIAL/PLATELET
Basophils Absolute: 0 10*3/uL (ref 0.0–0.1)
Basophils Relative: 0 %
EOS ABS: 0.5 10*3/uL (ref 0.0–0.7)
EOS PCT: 3 %
HCT: 27.6 % — ABNORMAL LOW (ref 39.0–52.0)
HEMOGLOBIN: 9.7 g/dL — AB (ref 13.0–17.0)
Lymphocytes Relative: 6 %
Lymphs Abs: 1 10*3/uL (ref 0.7–4.0)
MCH: 32.4 pg (ref 26.0–34.0)
MCHC: 35.1 g/dL (ref 30.0–36.0)
MCV: 92.3 fL (ref 78.0–100.0)
MONO ABS: 1.7 10*3/uL — AB (ref 0.1–1.0)
MONOS PCT: 11 %
Neutro Abs: 12.3 10*3/uL — ABNORMAL HIGH (ref 1.7–7.7)
Neutrophils Relative %: 80 %
Platelets: 96 10*3/uL — ABNORMAL LOW (ref 150–400)
RBC: 2.99 MIL/uL — ABNORMAL LOW (ref 4.22–5.81)
RDW: 20.1 % — ABNORMAL HIGH (ref 11.5–15.5)
WBC: 15.5 10*3/uL — ABNORMAL HIGH (ref 4.0–10.5)

## 2017-11-30 LAB — PROTIME-INR
INR: 2.33
PROTHROMBIN TIME: 25.4 s — AB (ref 11.4–15.2)

## 2017-11-30 LAB — MAGNESIUM: MAGNESIUM: 1 mg/dL — AB (ref 1.7–2.4)

## 2017-11-30 MED ORDER — OXYCODONE HCL 5 MG PO TABS: 5.0000 mg | ORAL_TABLET | Freq: Four times a day (QID) | ORAL | Status: DC | PRN

## 2017-11-30 MED ORDER — SODIUM CHLORIDE 0.9 % IV SOLN: 1500.0000 mg | INTRAVENOUS | Status: DC

## 2017-11-30 MED ORDER — MAGNESIUM SULFATE 2 GM/50ML IV SOLN
2.0000 g | Freq: Once | INTRAVENOUS | Status: AC
  Administered 2017-11-30: 2 g via INTRAVENOUS
  Filled 2017-11-30: qty 50

## 2017-11-30 MED ORDER — OXYCODONE HCL 5 MG PO TABS
5.0000 mg | ORAL_TABLET | Freq: Four times a day (QID) | ORAL | Status: DC | PRN
  Administered 2017-12-01 – 2017-12-05 (×5): 5 mg via ORAL
  Filled 2017-11-30 (×5): qty 1

## 2017-11-30 MED ORDER — MAGNESIUM OXIDE 400 (241.3 MG) MG PO TABS
400.0000 mg | ORAL_TABLET | Freq: Every day | ORAL | Status: DC
  Administered 2017-11-30 – 2017-12-05 (×6): 400 mg via ORAL
  Filled 2017-11-30 (×6): qty 1

## 2017-11-30 MED ORDER — FUROSEMIDE 20 MG PO TABS
20.0000 mg | ORAL_TABLET | Freq: Every day | ORAL | Status: DC
  Administered 2017-11-30 – 2017-12-01 (×2): 20 mg via ORAL
  Filled 2017-11-30 (×2): qty 1

## 2017-11-30 MED ORDER — VANCOMYCIN HCL IN DEXTROSE 750-5 MG/150ML-% IV SOLN: 750.0000 mg | Freq: Two times a day (BID) | INTRAVENOUS | Status: DC

## 2017-11-30 MED ORDER — SODIUM CHLORIDE 0.9 % IV SOLN
Freq: Once | INTRAVENOUS | Status: AC
  Administered 2017-11-30: 14:00:00 via INTRAVENOUS

## 2017-11-30 NOTE — Progress Notes (Signed)
Paged MD regarding critical total bilirubin 18.4

## 2017-11-30 NOTE — Progress Notes (Signed)
MD Danford called gave verbal order to wrap pt hematoma  Ordered ace bandages, educated pt  Awaiting on supplies

## 2017-11-30 NOTE — Progress Notes (Signed)
Pt daughter Lauren requesting update from MD  Provided contact info to MD to call daughter

## 2017-11-30 NOTE — Progress Notes (Signed)
RN received call from [employer] (nurse in transfer center from [place]) at [phone]. Jerene Pitch stated that she tried to reach MD Danford several times on different numbers. She states that because it takes a lot of people to get the liver transplant plan on the roll, she wanted to try starting tonight. She asked that this note be left for the MD's on Logan Patrick's team.

## 2017-11-30 NOTE — Progress Notes (Signed)
Preliminary results by tech - Venous Duplex Lower Ext. Completed. Right leg, negative for deep vein thrombosis. Left leg, technically difficult evaluation of the venous system due to excessive swelling and a large hematoma in the proximal and mid thigh area obscuring imaging. The femoral vein and popliteal veins appear open and patent with no evidence of obvious deep vein thrombosis color flow imaging. The calf veins appear patent. Oda Cogan, BS, RDMS, RVT

## 2017-11-30 NOTE — Progress Notes (Addendum)
Pharmacy Antibiotic Note  Logan Patrick is a 67 y.o. male admitted on 11/21/2017 with cellulitis.  Pharmacy has been consulted for Vancomycin dosing.  Patient with L-thigh hematoma s/p hemorrhagic shock now with cellulitis. WBC increased to 22.1K yesterday, today down to 15.4k.  Remains afebrile with T max = 99.1 SCr increasing, 2.04 today,  with normalized CrCl ~47 mL/min. UOP down to 450 ml,  0.1 ml/kg/hr 6 mL/min on IV Lasix.   5/8 Vancomycin trough= 24 mcg/ml on Vanc 1g IV q12 hr (SCr 1.09>1.44>2.06). Stopped current 10AM vancomycin 1g dose mid infusion and will adjust vancomycin dose.   Plan: Decrease IV Vancomycin to 1500 mg IV every 24 hours, next due 5/9 10:00 Monitor response, renal function, and clinical status.  Follow-up ability to change to oral and length of therapy  Height: 5' 10.5" (179.1 cm) Weight: 276 lb 0.3 oz (125.2 kg) IBW/kg (Calculated) : 74.15  Temp (24hrs), Avg:98.5 F (36.9 C), Min:98.1 F (36.7 C), Max:99.1 F (37.3 C)  Recent Labs  Lab 11/26/17 0408  11/27/17 0437 11/28/17 0507 11/29/17 0526 11/29/17 1428 11/30/17 0511 11/30/17 0926  WBC 7.8   < > 9.4 12.4* 15.5* 22.1* 15.4*  --   CREATININE 1.54*  --  1.17 1.09 1.44*  --  2.06*  --   VANCOTROUGH  --   --   --   --   --   --   --  24*   < > = values in this interval not displayed.    Estimated Creatinine Clearance: 47.2 mL/min (A) (by C-G formula based on SCr of 2.06 mg/dL (H)).    No Known Allergies  Antimicrobials this admission: Vanc 5/6 >>  Dose adjustments this admission: 5/8 VT 24 mcg/ml on Vanc 1g IV q12 hr (SCr 1.09>1.44>2.06),  Decrease/adjust dose to 1500 mg IV q24h , targe VT ~15 mcg/ml  Microbiology results: 4/29 Bldx2 - neg 4/30 MRSA pcr neg   Thank you for allowing pharmacy to be a part of this patient's care. Nicole Cella, RPh Clinical Pharmacist Pager: [phone] Clinical phone 11/30/2017 until 3:30PM - #[phone] After hours, please call #[phone] 11/30/2017 11:29 AM

## 2017-11-30 NOTE — Progress Notes (Addendum)
PROGRESS NOTE    Logan Patrick  GGE:[id number] DOB: 10/17/1950 DOA: 11/21/2017 PCP: Logan Sacramento, MD      Brief Narrative:  Logan Patrick is a 67 y.o. M with alcoholic cirrhosis, HTN who presented with syncope, found to have hemorrhagic shock from left thigh hematoma.   -Given 16units FFP and 8 units PRBCs total during hospitalization so far -Ortho consulted, not recommending evacuation   Assessment & Plan:  Left thigh hematoma Hemorrhagic shock Acute blood loss anemia Trauma preceding the hematoma.  Complicated by coagulopathy.  Has been discussed informally with Hematology, the coagulopathy is clearly due to underlying liver failure and will not reverse with medical treatments or transfusion. -Appreciate orthopedics assistance -Apply compression with ACE wrap -Encourage TED hose use -Monitor Hgb  Hgb worse today.   -Transfuse 1 unit -Discontinue antibiotics and monitor for fever  Alcoholic cirrhosis Thrombocytopenia Patient diagnosed with cirrhosis in 2014, quit drinking at that time per Logan Patrick, abstinent since.  Was well compensated until this spring, had used a cold therapy OTC, developed jaundice, and at that time, consideration of transplant was raised, although no formal referral has yet been made.  MELD today 35, even if we used his Bilirubin from 1 month ago 12 mg/dL, it would still be 33 (>50% mortality at 3 months).  No ascites.  No encephalopathy. -Start furosemide 20 mg PO daily -Continue lactulose and rifaximin  Syncope on admisison From [facility].  Acute renal failure Worsening today.  Suspect this is either ischemic injury in setting of severe anemia, or congestion.   -Transfuse 1 unit -Check urine electrolytes, UA/sediment -Diurese as above -D/c Vancomycin  Hypomagnesemia -Replace Mag  Protein calorie malnutrition -Continue Pro-stat   Hyponatremia From cirrhosis.       DVT prophylaxis: SCDs Code Status: FULL Family Communication: Daughter  by phone MDM and disposition Plan: The below labs and imaging reports were reviewed.  The patient's status is clinically stable.      He is admitted with worsening liver failure and coagulopathy with a thigh hematoma. The thigh hematoma is improving, and we are treating conservatively.  In the meantime, his renal function is worsening (necessitating the above work up) and I have consulted his primary hepatologist Logan Patrick who will see the patient today and help triage transfer to a tertiary care center for consideration of liver transplant, which has been initiated today.   Consultants:   Gastroenterology  Procedures:   Echo 5/1 Study Conclusions  - Left ventricle: The cavity size was normal. There was mild   concentric hypertrophy. Systolic function was vigorous. The   estimated ejection fraction was in the range of 65% to 70%. Wall   motion was normal; there were no regional wall motion   abnormalities. - Aortic valve: There was very mild stenosis. There was no   regurgitation. Peak velocity (S): 251 cm/s. Mean gradient (S): 12   mm Hg. Valve area (VTI): 2.59 cm^2. Valve area (Vmax): 2.66 cm^2.   Valve area (Vmean): 2.88 cm^2. - Mitral valve: Transvalvular velocity was within the normal range.   There was no evidence for stenosis. There was mild regurgitation. - Left atrium: The atrium was moderately dilated. - Right ventricle: The cavity size was normal. Wall thickness was   normal. Systolic function was normal. - Atrial septum: No defect or patent foramen ovale was identified. - Pulmonary arteries: Systolic pressure was within the normal   range. PA peak pressure: 28 mm Hg (S).    Carotid US 5/1  Technically difficult due to severe respiratory interference  Final Interpretation: Technically difficult due to above mention limitations. Right Carotid: Velocities in the right ICA are consistent with a 1-39% stenosis.  Left Carotid: Velocities in the left ICA are consistent  with a 1-39% stenosis.  Vertebrals: Bilateral vertebral arteries demonstrate antegrade flow. Subclavians: Normal flow hemodynamics were seen in bilateral subclavian       arteries.  *See table(s) above for measurements and observations.    LE doppler US Prelim negative     Antimicrobials:   Vancomycin 5/6 >> 5/8    Subjective: Feels better.  No fever.  No vomiting, abdominal swelling.  No confusion.  Left leg still very painful, mostly with movement, still profound leg swelling bilaterally, no pain in left calf/foot, no pallor, paralysis there.  Some dizziness with standing today.  Objective: Vitals:   11/29/17 2100 11/29/17 2119 11/30/17 0211 11/30/17 0248  BP: (!) 104/57 (!) 111/58  107/62  Pulse: (!) 105 (!) 105  (!) 106  Resp: 19   18  Temp: 99.1 F (37.3 C)   98.4 F (36.9 C)  TempSrc: Oral   Oral  SpO2: 96% 95%  98%  Weight:   125.2 kg (276 lb 0.3 oz)   Height:        Intake/Output Summary (Last 24 hours) at 11/30/2017 2703 Last data filed at 11/30/2017 0600 Gross per 24 hour  Intake 875 ml  Output 450 ml  Net 425 ml   Filed Weights   11/28/17 0554 11/29/17 0428 11/30/17 0211  Weight: 127.1 kg (280 lb 3.2 oz) 126.6 kg (279 lb) 125.2 kg (276 lb 0.3 oz)    Examination: General appearance: Adult male, alert and in no acute distress.   HEENT: Icterus obvious, conjunctiva pink, lids and lashes normal. No nasal deformity, discharge, epistaxis.  Lips moist, teeth normal.  No oral lesions, oral mcuosa moist.   Skin: Warm and dry.  Marked jaundice.  No suspicious rashes or lesions no other stigmata of liver disease. Cardiac: RRR, nl S1-S2, no murmurs appreciated.  Capillary refill is brisk.  JVP normal.  3+ LE edema bilaterally.  Radial pulses 2+ and symmetric.  Can't feel lower extremity DP pulses due to pitting edema, but are warm and mobile. Respiratory: Normal respiratory rate and rhythm.  CTAB without rales or wheezes. Abdomen: Abdomen soft.  No TTP. No  ascites, distension.  Spleen tip palpated.  Liver hard to appreciate given habitus.   MSK: No deformities or effusions in large joints of upper or lower extremities bilatearlly, some muscle wasting in extremities noted.  The left thigh is large swollen, tense and purple.  Distal neurocardiac intact. Neuro: Awake and alert.  EOMI, moves all extremities. Speech fluent.    Psych: Sensorium intact and responding to questions, attention normal. Affect normal.  Judgment and insight appear normal.    Data Reviewed: I have personally reviewed following labs and imaging studies:  CBC: Recent Labs  Lab 11/25/17 0445  11/26/17 1512 11/27/17 0437 11/28/17 0507 11/28/17 2017 11/29/17 0526 11/29/17 1428 11/29/17 2353  WBC 8.1   < > 10.3 9.4 12.4*  --  15.5* 22.1*  --   NEUTROABS 5.4  --   --   --   --   --   --   --   --   HGB 6.6*   < > 9.9* 8.4* 7.4* 8.0* 7.4* 8.1* 8.0*  HCT 19.1*   < > 29.3* 25.5* 22.5* 24.7* 22.7* 24.3* 23.8*  MCV 96.5   < >  94.2 95.1 97.4  --  96.2 96.8  --   PLT 66*   < > 73* 75* 74*  --  83* 104*  --    < > = values in this interval not displayed.   Basic Metabolic Panel: Recent Labs  Lab 11/24/17 0404 11/25/17 0445 11/26/17 0408 11/27/17 0437 11/28/17 0507 11/29/17 0526  NA 133* 135 140 136 136 131*  K 3.7 3.1* 2.8* 3.0* 3.7 3.5  CL 101 103 102 97* 98* 94*  CO2 23 25 28 28 27 25   GLUCOSE 98 93 103* 107* 84 100*  BUN 25* 28* 23* 23* 26* 38*  CREATININE 2.34* 1.99* 1.54* 1.17 1.09 1.44*  CALCIUM 8.4* 8.7* 9.1 8.9 8.7* 8.2*  MG 1.7  --   --   --   --   --   PHOS 5.1*  --   --   --   --   --    GFR: Estimated Creatinine Clearance: 67.5 mL/min (A) (by C-G formula based on SCr of 1.44 mg/dL (H)). Liver Function Tests: Recent Labs  Lab 11/26/17 0408 11/28/17 0507 11/29/17 0526 11/29/17 0743  AST 53* 54* 58*  --   ALT 27 28 27   --   ALKPHOS 125 98 84  --   BILITOT 12.0* 15.9* 18.0* 17.8*  PROT 5.5* 5.7* 5.4*  --   ALBUMIN 2.4* 2.5* 2.2*  --    No  results for input(s): LIPASE, AMYLASE in the last 168 hours. No results for input(s): AMMONIA in the last 168 hours. Coagulation Profile: Recent Labs  Lab 11/25/17 0445 11/26/17 0408 11/27/17 0437 11/28/17 0507 11/29/17 0526  INR 2.17 1.90 2.16 1.99 2.30   Cardiac Enzymes: No results for input(s): CKTOTAL, CKMB, CKMBINDEX, TROPONINI in the last 168 hours. BNP (last 3 results) No results for input(s): PROBNP in the last 8760 hours. HbA1C: No results for input(s): HGBA1C in the last 72 hours. CBG: No results for input(s): GLUCAP in the last 168 hours. Lipid Profile: No results for input(s): CHOL, HDL, LDLCALC, TRIG, CHOLHDL, LDLDIRECT in the last 72 hours. Thyroid Function Tests: No results for input(s): TSH, T4TOTAL, FREET4, T3FREE, THYROIDAB in the last 72 hours. Anemia Panel: No results for input(s): VITAMINB12, FOLATE, FERRITIN, TIBC, IRON, RETICCTPCT in the last 72 hours. Urine analysis:    Component Value Date/Time   COLORURINE AMBER (A) 11/21/2017 1948   APPEARANCEUR CLOUDY (A) 11/21/2017 1948   LABSPEC 1.025 11/21/2017 1948   PHURINE 5.5 11/21/2017 1948   GLUCOSEU 100 (A) 11/21/2017 1948   HGBUR MODERATE (A) 11/21/2017 1948   BILIRUBINUR LARGE (A) 11/21/2017 1948   KETONESUR 15 (A) 11/21/2017 1948   PROTEINUR 30 (A) 11/21/2017 1948   UROBILINOGEN 1.0 11/30/2012 2041   NITRITE POSITIVE (A) 11/21/2017 1948   LEUKOCYTESUR NEGATIVE 11/21/2017 1948   Sepsis Labs: @LABRCNTIP (procalcitonin:4,lacticacidven:4)  ) Recent Results (from the past 240 hour(s))  Culture, blood (routine x 2)     Status: None   Collection Time: 11/21/17  3:20 PM  Result Value Ref Range Status   Specimen Description   Final    BLOOD LEFT ANTECUBITAL Performed at [facility], [address] [place], [place] [address]    Special Requests   Final    BOTTLES DRAWN AEROBIC AND ANAEROBIC Blood Culture adequate volume Performed at [facility], [address] [place], [place] [address]    Culture   Final    NO GROWTH 5 DAYS Performed at [facility], [place] [address] [place], [place] [address]    Report Status 11/26/2017 FINAL  Final  Culture, blood (routine x 2)     Status: None   Collection Time: 11/21/17  3:40 PM  Result Value Ref Range Status   Specimen Description   Final    BLOOD LEFT HAND Performed at [facility], [address] [place], [place] [address]    Special Requests   Final    BOTTLES DRAWN AEROBIC AND ANAEROBIC Blood Culture adequate volume Performed at [facility], [address] [place], [place] [address]    Culture   Final    NO GROWTH 5 DAYS Performed at [facility], [place] [address] [place], [place] [address]    Report Status 11/26/2017 FINAL  Final  MRSA PCR Screening     Status: None   Collection Time: 11/22/17  3:34 PM  Result Value Ref Range Status   MRSA by PCR NEGATIVE NEGATIVE Final    Comment:        The GeneXpert MRSA Assay (FDA approved for NASAL specimens only), is one component of a comprehensive MRSA colonization surveillance program. It is not intended to diagnose MRSA infection nor to guide or monitor treatment for MRSA infections. Performed at [facility], [place] [address] [place], [place] [address]          Radiology Studies: No results found.      Scheduled Meds: . feeding supplement (PRO-STAT SUGAR FREE 64)  30 mL Oral BID  . furosemide  20 mg Intravenous Once  . lactulose  30 g Oral BID  . pantoprazole  40 mg Oral BID  . polyethylene glycol  17 g Oral BID  . rifaximin  550 mg Oral BID   Continuous Infusions: . sodium chloride    . sodium chloride    . vancomycin Stopped (11/29/17 2237)     LOS: 9 days    Time spent: 45 minutes    Edwin Dada, MD [facility] 11/30/2017, 10:45 AM     Pager [phone] --- please page though AMION:  [email or web address] Password [email or web address] If 7PM-7AM, please contact  night-coverage

## 2017-11-30 NOTE — Consult Note (Signed)
Reason for Consult: Worsening cirrhosis Referring Physician: [facility]  Davis Gourd HPI: This is a 67 year old male with a PMH of ETOH cirrhosis (abstinent since 2014) and obesity admitted for a syncopal episode and left thigh hematoma.  He was at work and then had a syncopal episode.  That day was hot and he may have been dehydrated.  In the ER he was noted to have another episode and he did ultimately respond to fluids.  Over the interval time period his hepatic function has deteriorated.  At the time of admission his creatinine was at 0.9 with a baseline around 0.8.  The creatinine increased up to 2.3 and then declined to 2.0, which is the current value.  His TB increased from 10 to 18 and his INR is at 2.3.  Earlier in the year, 08/2017, he was evaluated in the office for routine follow up.  At that time he was overtly jaundiced and he was suffering with an URI/cough for 18 weeks prior.  He took several over-the-counter medications and this was felt to be the source for his hepatic decompensation, however, the specific over-the-counter cough remedies were not known.  The hope was that his TB would improve and it seemed to be that way when he dropped down from 12 to 10, but he continued to remain at 10.  Fortunately in the office his creatinine was at 0.8 and clinically he was well.  He did not voice any complaints, but the subject of transplantation was briefly discussed.  The patient was to follow up in the next couple of weeks and repeat blood work with a calculation of his MELD score was going to be performed.  Past Medical History:  Diagnosis Date  . Alcoholic cirrhosis of liver ([place])   . Hypertension   . Liver failure ([place])   . Pneumonia     Past Surgical History:  Procedure Laterality Date  . UMBILICAL HERNIA REPAIR      Family History  Problem Relation Age of Onset  . Diabetes type II Mother     Social History:  reports that he quit smoking about 41 years ago. He has  never used smokeless tobacco. He reports that he does not drink alcohol or use drugs.  Allergies: No Known Allergies  Medications:  Scheduled: . feeding supplement (PRO-STAT SUGAR FREE 64)  30 mL Oral BID  . furosemide  20 mg Oral Daily  . lactulose  30 g Oral BID  . magnesium oxide  400 mg Oral Daily  . pantoprazole  40 mg Oral BID  . polyethylene glycol  17 g Oral BID  . rifaximin  550 mg Oral BID   Continuous: . sodium chloride      Results for orders placed or performed during the hospital encounter of 11/21/17 (from the past 24 hour(s))  Hemoglobin and hematocrit, blood     Status: Abnormal   Collection Time: 11/29/17 11:53 PM  Result Value Ref Range   Hemoglobin 8.0 (L) 13.0 - 17.0 g/dL   HCT 23.8 (L) 39.0 - 52.0 %  Comprehensive metabolic panel     Status: Abnormal   Collection Time: 11/30/17  5:11 AM  Result Value Ref Range   Sodium 130 (L) 135 - 145 mmol/L   Potassium 3.7 3.5 - 5.1 mmol/L   Chloride 93 (L) 101 - 111 mmol/L   CO2 26 22 - 32 mmol/L   Glucose, Bld 84 65 - 99 mg/dL   BUN 54 (H) 6 -  20 mg/dL   Creatinine, Ser 2.06 (H) 0.61 - 1.24 mg/dL   Calcium 8.0 (L) 8.9 - 10.3 mg/dL   Total Protein 4.9 (L) 6.5 - 8.1 g/dL   Albumin 2.1 (L) 3.5 - 5.0 g/dL   AST 74 (H) 15 - 41 U/L   ALT 30 17 - 63 U/L   Alkaline Phosphatase 86 38 - 126 U/L   Total Bilirubin 18.4 (HH) 0.3 - 1.2 mg/dL   GFR calc non Af Amer 32 (L) >60 mL/min   GFR calc Af Amer 37 (L) >60 mL/min   Anion gap 11 5 - 15  CBC     Status: Abnormal   Collection Time: 11/30/17  5:11 AM  Result Value Ref Range   WBC 15.4 (H) 4.0 - 10.5 K/uL   RBC 2.43 (L) 4.22 - 5.81 MIL/uL   Hemoglobin 7.7 (L) 13.0 - 17.0 g/dL   HCT 23.0 (L) 39.0 - 52.0 %   MCV 94.7 78.0 - 100.0 fL   MCH 31.7 26.0 - 34.0 pg   MCHC 33.5 30.0 - 36.0 g/dL   RDW 20.3 (H) 11.5 - 15.5 %   Platelets 92 (L) 150 - 400 K/uL  Protime-INR     Status: Abnormal   Collection Time: 11/30/17  5:11 AM  Result Value Ref Range   Prothrombin Time  25.4 (H) 11.4 - 15.2 seconds   INR 2.33   Magnesium     Status: Abnormal   Collection Time: 11/30/17  5:11 AM  Result Value Ref Range   Magnesium 1.0 (L) 1.7 - 2.4 mg/dL  Vancomycin, trough     Status: Abnormal   Collection Time: 11/30/17  9:26 AM  Result Value Ref Range   Vancomycin Tr 24 (HH) 15 - 20 ug/mL  Prepare RBC     Status: None   Collection Time: 11/30/17 12:40 PM  Result Value Ref Range   Order Confirmation      ORDER PROCESSED BY BLOOD BANK Performed at [facility], [address] [place], [place] [address]   CBC with Differential/Platelet     Status: Abnormal   Collection Time: 11/30/17  7:55 PM  Result Value Ref Range   WBC 15.5 (H) 4.0 - 10.5 K/uL   RBC 2.99 (L) 4.22 - 5.81 MIL/uL   Hemoglobin 9.7 (L) 13.0 - 17.0 g/dL   HCT 27.6 (L) 39.0 - 52.0 %   MCV 92.3 78.0 - 100.0 fL   MCH 32.4 26.0 - 34.0 pg   MCHC 35.1 30.0 - 36.0 g/dL   RDW 20.1 (H) 11.5 - 15.5 %   Platelets 96 (L) 150 - 400 K/uL   Neutrophils Relative % 80 %   Neutro Abs 12.3 (H) 1.7 - 7.7 K/uL   Lymphocytes Relative 6 %   Lymphs Abs 1.0 0.7 - 4.0 K/uL   Monocytes Relative 11 %   Monocytes Absolute 1.7 (H) 0.1 - 1.0 K/uL   Eosinophils Relative 3 %   Eosinophils Absolute 0.5 0.0 - 0.7 K/uL   Basophils Relative 0 %   Basophils Absolute 0.0 0.0 - 0.1 K/uL     No results found.  ROS:  As stated above in the HPI otherwise negative.  Blood pressure 123/76, pulse (!) 107, temperature 98 F (36.7 C), temperature source Oral, resp. rate 18, height 5' 10.5" (1.791 m), weight 125.2 kg (276 lb 0.3 oz), SpO2 95 %.    PE: Gen: NAD, Alert and Oriented HEENT:  [place]/AT, EOMI Neck: Supple, no LAD Lungs:  CTA Bilaterally CV: RRR without M/G/R ABM: Soft, NTND, +BS Ext: No C/C/E  Assessment/Plan: 1) Decompensated cirrhosis. 2) Renal insufficiency. 3) Worsening coagulopathy. 4) Large left thigh hematoma.   His current MELD score is at 35, but using a lower bilirubin at 10 only changed his MELD  down to 33.  His hematoma is driving up the TB, but he has a new baseline TB at 10.  Another poor sign of hepatic failure is the worsening INR.  His creatinine has markedly worsened and his recorded urine output is declining, although this is not strictly recorded.  His mentation is normal at this time.  Dr. Loleta Books and I discussed and [facility] will be contacted about transplantation.  It will be good to obtain their input into his situation.  Plan: 1) Supportive care at this time. 2) Monitor mentation and routine blood work.  Kelan Pritt D 11/30/2017, 10:26 PM

## 2017-11-30 NOTE — Progress Notes (Signed)
Paged MD for critical vancomycin trough and pharmacy  Pharmacist gave verbal order to stop IVBP, IV vanocomycin stopped. Education provided to the pt, pt understanding

## 2017-11-30 NOTE — Progress Notes (Signed)
Applied two ace bandage wraps to pt left thigh, pt tolerated well

## 2017-12-01 DIAGNOSIS — E43 Unspecified severe protein-calorie malnutrition: Secondary | ICD-10-CM

## 2017-12-01 DIAGNOSIS — K7030 Alcoholic cirrhosis of liver without ascites: Secondary | ICD-10-CM

## 2017-12-01 DIAGNOSIS — L8990 Pressure ulcer of unspecified site, unspecified stage: Secondary | ICD-10-CM | POA: Diagnosis present

## 2017-12-01 HISTORY — DX: Alcoholic cirrhosis of liver without ascites: K70.30

## 2017-12-01 LAB — PROTIME-INR
INR: 2.29
Prothrombin Time: 25 seconds — ABNORMAL HIGH (ref 11.4–15.2)

## 2017-12-01 LAB — PROTEIN / CREATININE RATIO, URINE
Creatinine, Urine: 65.84 mg/dL
PROTEIN CREATININE RATIO: 0.12 mg/mg{creat} (ref 0.00–0.15)
TOTAL PROTEIN, URINE: 8 mg/dL

## 2017-12-01 LAB — TYPE AND SCREEN
ABO/RH(D): A POS
ANTIBODY SCREEN: NEGATIVE
UNIT DIVISION: 0
Unit division: 0
Unit division: 0

## 2017-12-01 LAB — BPAM RBC
BLOOD PRODUCT EXPIRATION DATE: 201905282359
Blood Product Expiration Date: 201905232359
Blood Product Expiration Date: 201905262359
ISSUE DATE / TIME: 201905081346
ISSUE DATE / TIME: 201905061333
ISSUE DATE / TIME: 201905071742
UNIT TYPE AND RH: 6200
UNIT TYPE AND RH: 6200
Unit Type and Rh: 6200

## 2017-12-01 LAB — URINALYSIS, ROUTINE W REFLEX MICROSCOPIC
BILIRUBIN URINE: NEGATIVE
GLUCOSE, UA: NEGATIVE mg/dL
Ketones, ur: NEGATIVE mg/dL
NITRITE: NEGATIVE
Protein, ur: NEGATIVE mg/dL
Specific Gravity, Urine: 1.01 (ref 1.005–1.030)
pH: 6 (ref 5.0–8.0)

## 2017-12-01 LAB — CREATININE, URINE, RANDOM: Creatinine, Urine: 67.12 mg/dL

## 2017-12-01 LAB — COMPREHENSIVE METABOLIC PANEL
ALBUMIN: 2 g/dL — AB (ref 3.5–5.0)
ALT: 34 U/L (ref 17–63)
AST: 76 U/L — AB (ref 15–41)
Alkaline Phosphatase: 90 U/L (ref 38–126)
Anion gap: 11 (ref 5–15)
BUN: 61 mg/dL — AB (ref 6–20)
CHLORIDE: 93 mmol/L — AB (ref 101–111)
CO2: 27 mmol/L (ref 22–32)
Calcium: 8.4 mg/dL — ABNORMAL LOW (ref 8.9–10.3)
Creatinine, Ser: 2.36 mg/dL — ABNORMAL HIGH (ref 0.61–1.24)
GFR calc Af Amer: 31 mL/min — ABNORMAL LOW (ref >60)
GFR calc non Af Amer: 27 mL/min — ABNORMAL LOW (ref >60)
GLUCOSE: 109 mg/dL — AB (ref 65–99)
POTASSIUM: 3.4 mmol/L — AB (ref 3.5–5.1)
Sodium: 131 mmol/L — ABNORMAL LOW (ref 135–145)
Total Bilirubin: 21.6 mg/dL (ref 0.3–1.2)
Total Protein: 5.2 g/dL — ABNORMAL LOW (ref 6.5–8.1)

## 2017-12-01 LAB — CBC
HEMATOCRIT: 27 % — AB (ref 39.0–52.0)
Hemoglobin: 9.1 g/dL — ABNORMAL LOW (ref 13.0–17.0)
MCH: 31.1 pg (ref 26.0–34.0)
MCHC: 33.7 g/dL (ref 30.0–36.0)
MCV: 92.2 fL (ref 78.0–100.0)
Platelets: 100 10*3/uL — ABNORMAL LOW (ref 150–400)
RBC: 2.93 MIL/uL — ABNORMAL LOW (ref 4.22–5.81)
RDW: 20.2 % — ABNORMAL HIGH (ref 11.5–15.5)
WBC: 14.6 10*3/uL — AB (ref 4.0–10.5)

## 2017-12-01 LAB — SODIUM, URINE, RANDOM: Sodium, Ur: <10 mmol/L

## 2017-12-01 LAB — CK: CK TOTAL: 87 U/L (ref 49–397)

## 2017-12-01 NOTE — Progress Notes (Signed)
Steady improvement continues.  Ambulated in hall today.   LLE hematoma less tense Removed ACE to reduce tourniquet effect at the thigh and will convert back to hose  Will recheck next week.  Logan [place], MD [facility], [facility] [phone]

## 2017-12-01 NOTE — Consult Note (Signed)
Logan Patrick Admit Date: 11/21/2017 12/01/2017 Rexene Agent Requesting Physician:  Loleta Books MD  Reason for Consult:  AKI  HPI:  54M admitted 11/21/2017 after presenting with syncope.  PMH Incudes:  Alcoholic cirrhosis, abstinent for several years  Hypertension by chart review, not on antihypertensives at time of admission  During this admission patient has developed bruising and swelling in the anterior left thigh and imaging on 5/5 demonstrated massive thigh hematoma.  INR has been elevated due to liver disease.  He has required 16 units of FFP and 9 units of packed red cells to treat this.  Followed by orthopedics, no surgical intervention or drain.  Received empiric antibiotics with vancomycin.   Patient has had decompensated cirrhosis manifested by increasing total bilirubin, renal failure, coagulopathy.  Gastroenterology is following, he has been accepted to [facility] for ongoing care for his cirrhosis with consideration of transplant.  Creatinine was normal at the time of admission.  At admission he received IV contrast for a CT of the abdomen and pelvis.  Creatinine subsequently increased, peaking at 2.34 on 5/2 before downtrending to evaluate 1.09 on 5/6.  Since then the creatinine has further increased to evaluate 2.36 today.  CT of the abdomen and pelvis at time of admission did not identify structural renal issues or hydronephrosis.  He currently has a Foley catheter.  Urine output has been variable ranging from 0.5 to 7.5 L a day on 5/3.  Yesterday he made 1.3 L in urine on low-dose furosemide.  No further contrast exposure.  No nonsteroidal exposure during admission.  CK was 90 on 11/21/2017.  Not rechecked since.  Urine analysis on the same date had moderate hemoglobin with 11-20 erythrocytes per high-powered field.  1+ protein.  Albumin is 2.0, total bilirubin 21.6, INR 2.29.  Blood pressures have been soft, systolic in the 093G, diastolic in the 18E.  Today he was up and able to  walk the entirety of the hall, feeling stronger.  Orthopedics is pleased with the current status of his hematoma, and feeling progress is being made.    Creatinine, Ser (mg/dL)  Date Value  12/01/2017 2.36 (H)  11/30/2017 2.06 (H)  11/29/2017 1.44 (H)  11/28/2017 1.09  11/27/2017 1.17  11/26/2017 1.54 (H)  11/25/2017 1.99 (H)  11/24/2017 2.34 (H)  11/23/2017 1.95 (H)  11/22/2017 1.56 (H)  ] I/Os: I/O last 3 completed shifts: In: 9937 [P.O.:825; Blood:630] Out: 1600 [Urine:1600]   ROS NSAIDS: Single dose of Motrin the day prior to admission IV Contrast CT abdomen and pelvis with contrast on 11/21/2017 TMP/SMX no exposure Hypotension mild, chronic, related to liver disease Balance of 12 systems is negative w/ exceptions as above  PMH  Past Medical History:  Diagnosis Date  . Alcoholic cirrhosis of liver ([place])   . Hypertension   . Liver failure ([place])   . Pneumonia    PSH  Past Surgical History:  Procedure Laterality Date  . UMBILICAL HERNIA REPAIR     FH  Family History  Problem Relation Age of Onset  . Diabetes type II Mother    SH  reports that he quit smoking about 41 years ago. He has never used smokeless tobacco. He reports that he does not drink alcohol or use drugs. Allergies No Known Allergies Home medications Prior to Admission medications   Medication Sig Start Date End Date Taking? Authorizing Provider  B Complex Vitamins (B COMPLEX-B12 PO) Take 1 tablet by mouth daily.   Yes [provider]  ibuprofen (  ADVIL,MOTRIN) 200 MG tablet Take 200 mg by mouth every 6 (six) hours as needed for mild pain.    Yes [provider]  Multiple Vitamin (MULTIVITAMIN WITH MINERALS) TABS tablet Take 1 tablet by mouth daily.   Yes [provider]  rifaximin (XIFAXAN) 550 MG TABS Take 550 mg by mouth 2 (two) times daily.   Yes [provider]  albuterol-ipratropium (COMBIVENT) 18-103 MCG/ACT inhaler Inhale 2 puffs into the lungs every 6  (six) hours as needed for wheezing. For wheezing    [provider]  pantoprazole (PROTONIX) 40 MG tablet Take 40 mg by mouth 2 (two) times daily.    [provider]    Current Medications Scheduled Meds: . feeding supplement (PRO-STAT SUGAR FREE 64)  30 mL Oral BID  . furosemide  20 mg Oral Daily  . lactulose  30 g Oral BID  . magnesium oxide  400 mg Oral Daily  . pantoprazole  40 mg Oral BID  . polyethylene glycol  17 g Oral BID  . rifaximin  550 mg Oral BID   Continuous Infusions: . sodium chloride     PRN Meds:.diphenhydrAMINE, iopamidol, ipratropium-albuterol, morphine injection, ondansetron (ZOFRAN) IV, oxyCODONE, sodium chloride flush  CBC Recent Labs  Lab 11/25/17 0445  11/30/17 0511 11/30/17 1955 12/01/17 0559  WBC 8.1   < > 15.4* 15.5* 14.6*  NEUTROABS 5.4  --   --  12.3*  --   HGB 6.6*   < > 7.7* 9.7* 9.1*  HCT 19.1*   < > 23.0* 27.6* 27.0*  MCV 96.5   < > 94.7 92.3 92.2  PLT 66*   < > 92* 96* 100*   < > = values in this interval not displayed.   Basic Metabolic Panel Recent Labs  Lab 11/25/17 0445 11/26/17 0408 11/27/17 0437 11/28/17 0507 11/29/17 0526 11/30/17 0511 12/01/17 0559  NA 135 140 136 136 131* 130* 131*  K 3.1* 2.8* 3.0* 3.7 3.5 3.7 3.4*  CL 103 102 97* 98* 94* 93* 93*  CO2 25 28 28 27 25 26 27   GLUCOSE 93 103* 107* 84 100* 84 109*  BUN 28* 23* 23* 26* 38* 54* 61*  CREATININE 1.99* 1.54* 1.17 1.09 1.44* 2.06* 2.36*  CALCIUM 8.7* 9.1 8.9 8.7* 8.2* 8.0* 8.4*    Physical Exam  Blood pressure (!) 106/57, pulse 99, temperature 97.7 F (36.5 C), temperature source Oral, resp. rate 18, height 5' 10.5" (1.791 m), weight 129.5 kg (285 lb 6.4 oz), SpO2 95 %. GEN: No acute distress, chronically ill-appearing ENT: NCAT, poor dentition EYES: EOMI CV: Regular, normal S1-S2, no rub, no murmur PULM: Clear bilaterally ABD: Obese, mild distention, soft, nontender SKIN: Massive ecchymosis in the entirety of the proximal thigh EXT:  4+ edema both legs, worse in the right NEURO: nonfocal, no asterixus GU: Foley in place   Assessment 68 year old male with relatively normal GFR admitted 4/29 after syncope and found to have massive hematoma  of the left thigh with ABLA requiring significant transfusions.  Further, now has decompensated cirrhosis with coagulopathy, rising total bilirubin (partially related to resolution of hematoma), and renal failure.  He is nonoliguric.  He has had 2 distinct episodes of renal failure.  I think the earlier episode was likely related to contrast exposure at the time of presentation.  More recently renal function has been declining again.  It is nonoliguric.  Differential here would be hepatorenal syndrome (less favored given nonoliguric state), ATN, potential pigment nephropathy from resolution of  hematoma/rhabdomyolysis from damage to muscle.  1. AKI, normal GFR at baseline; not uremic; DDx as above: HRS, ATN, pigment nephropathy 2. Decompensated ETOH cirrhosis, GI following, potentially to [facility] for further management 3. Massive hematoma of L thigh req sig pRBC and FFP transfusions 4. Syncope 5. Hyponatremia, mild; hypervolemic; monitor  Plan 1. Repeat CK, UA, UNa, UP/C; Daily RFP 2. I don't think we need to hold lasix at this time given profound hypervolemia with reasonable alternative explanations to HRS 3. Don't think we need renal US, admission CT A/P ok and has foley catheter 4. If UNa is very low would make HRS more likely (note rec lasix qAM) consider holding diuretics and albumin challenge at that time with likely addition of midodrine and octreotide    Pearson Grippe MD [phone] pgr 12/01/2017, 4:15 PM

## 2017-12-01 NOTE — Progress Notes (Addendum)
Subjective: No complaints.  Feeling well.  Objective: Vital signs in last 24 hours: Temp:  [97.7 F (36.5 C)-98 F (36.7 C)] 97.7 F (36.5 C) (05/09 1478) Pulse Rate:  [98-107] 99 (05/09 0637) Resp:  [17-18] 18 (05/09 0637) BP: (103-123)/(57-76) 106/57 (05/09 0637) SpO2:  [95 %-97 %] 95 % (05/09 0637) Weight:  [129.5 kg (285 lb 6.4 oz)] 129.5 kg (285 lb 6.4 oz) (05/09 0637) Last BM Date: 11/30/17  Intake/Output from previous day: 05/08 0701 - 05/09 0700 In: 900 [P.O.:585; Blood:315] Out: 1250 [Urine:1250] Intake/Output this shift: No intake/output data recorded.  General appearance: alert and no distress GI: obese, soft, unable to assess for ascites clinically Extremities: 4+ pitting edema, NO asterixis  Lab Results: Recent Labs    11/30/17 0511 11/30/17 1955 12/01/17 0559  WBC 15.4* 15.5* 14.6*  HGB 7.7* 9.7* 9.1*  HCT 23.0* 27.6* 27.0*  PLT 92* 96* 100*   BMET Recent Labs    11/29/17 0526 11/30/17 0511 12/01/17 0559  NA 131* 130* 131*  K 3.5 3.7 3.4*  CL 94* 93* 93*  CO2 25 26 27   GLUCOSE 100* 84 109*  BUN 38* 54* 61*  CREATININE 1.44* 2.06* 2.36*  CALCIUM 8.2* 8.0* 8.4*   LFT Recent Labs    11/29/17 0743  12/01/17 0559  PROT  --    < > 5.2*  ALBUMIN  --    < > 2.0*  AST  --    < > 76*  ALT  --    < > 34  ALKPHOS  --    < > 90  BILITOT 17.8*   < > 21.6*  BILIDIR 5.8*  --   --   IBILI 12.0*  --   --    < > = values in this interval not displayed.   PT/INR Recent Labs    11/30/17 0511 12/01/17 0559  LABPROT 25.4* 25.0*  INR 2.33 2.29   Hepatitis Panel No results for input(s): HEPBSAG, HCVAB, HEPAIGM, HEPBIGM in the last 72 hours. C-Diff No results for input(s): CDIFFTOX in the last 72 hours. Fecal Lactopherrin No results for input(s): FECLLACTOFRN in the last 72 hours.  Studies/Results: No results found.  Medications:  Scheduled: . feeding supplement (PRO-STAT SUGAR FREE 64)  30 mL Oral BID  . furosemide  20 mg Oral Daily  .  lactulose  30 g Oral BID  . magnesium oxide  400 mg Oral Daily  . pantoprazole  40 mg Oral BID  . polyethylene glycol  17 g Oral BID  . rifaximin  550 mg Oral BID   Continuous: . sodium chloride      Assessment/Plan: 1) Worsening decompensated cirrhosis. 2) Left thigh. 3) Worsening renal insufficiency.   Creatinine continues to increase.  TB has also increased, but it is difficult to know how much the elevation is from his liver failure versus his hematoma.  There is no evidence of hepatic encephalopathy.  The creatinine continues to worsen, but he was able to have good urine output yesterday at 1250 ml.  Currently he is on oral lasix 20 mg QD.  I am not certain how accurate these I/O reports are on the regular floor.  It will be good to have Renal evaluate the patient for HRS.  Most likely he will benefit with albumin, at the very least, and midodrine and octreotide.  Plan: 1) Renal consult. 2) Await transfer to [facility]. 3) Continue with 2 gram sodium diet. 4) Continue with supportive care.  LOS: 10  days   Jerelene Salaam D 12/01/2017, 3:04 PM

## 2017-12-01 NOTE — Progress Notes (Signed)
Patient has foley catheter clamped to obtain urine sample for the ordered labs.  No urine obtained in the foley tubing yet.  Night shift nurse notified foley catheter clamped during shift report.  Patient refused ted hose placement this afternoon.  Patient stated that ted hose made his legs hurt when they were placed on yesterday.   Marcille Blanco, RN

## 2017-12-01 NOTE — Progress Notes (Signed)
Occupational Therapy Treatment Patient Details Name: Logan Patrick MRN: [id number] DOB: [birth year] Today's Date: 12/01/2017    History of present illness Pt is a 67 y.o. male admitted 11/21/17 after falling at work noted to have hemorrhagic shock secondary to massive left thigh hematoma secondary to coagulopathy and thrombocytopenia from advanced liver cirrhosis. Pt sufferred a syncopal vs seizure in ED that resolved with IVF resucitation. MRI shows no acute intracranial abnormality. Pt also with hepatic failure in setting of alcoholic cirrhosis. PMH includes liver failure, HTN.    OT comments  Pt progressing towards OT goals this session. Pt was able to improve in bed mobility, transfers, and energy was better today. OT will continue to work with Pt acutely. Next session to focus on energy conservation, and access to LB for ADL.  During the course of the session the patient explained to me many time that he feels like he is getting mixed signals and mixed recommendations from various practitioners. Due to the seriousness of his situation, the number of specialists involved I feel like a Palliative Medicine Consult is warranted and would be appreciated by the patient and his family.   Follow Up Recommendations  Home health OT;Supervision/Assistance - 24 hour    Equipment Recommendations  Tub/shower seat;3 in 1 bedside commode(WIDE)    Recommendations for Other Services  Palliative Care Consult    Precautions / Restrictions Precautions Precautions: Fall Precaution Comments: L thigh hematoma Restrictions Weight Bearing Restrictions: No       Mobility Bed Mobility Overal bed mobility: Needs Assistance Bed Mobility: Supine to Sit;Sit to Supine     Supine to sit: HOB elevated;Supervision Sit to supine: Min assist   General bed mobility comments: Pt required min assist for LLE back into bed, Pt able to use BUE to pull up in bed  Transfers Overall transfer level: Needs  assistance Equipment used: None Transfers: Sit to/from [facility] Sit to Stand: Min guard Stand pivot transfers: Min guard       General transfer comment: min guard for safety    Balance Overall balance assessment: Needs assistance Sitting-balance support: No upper extremity supported Sitting balance-Leahy Scale: Good     Standing balance support: Single extremity supported;During functional activity Standing balance-Leahy Scale: Fair                             ADL either performed or assessed with clinical judgement   ADL Overall ADL's : Needs assistance/impaired     Grooming: Min guard;Standing                   Toilet Transfer: Min guard;Stand-pivot;BSC;Requires Estate agent Details (indicate cue type and reason): short, shuffling steps Toileting- Clothing Manipulation and Hygiene: Supervision/safety;Sit to/from stand Toileting - Clothing Manipulation Details (indicate cue type and reason): able to perform rear peri care     Functional mobility during ADLs: Minimal assistance(SPT only) General ADL Comments: Pt with decreased activity tolerance for ADL participation.      Vision   Vision Assessment?: No apparent visual deficits   Perception     Praxis      Cognition Arousal/Alertness: Awake/alert Behavior During Therapy: WFL for tasks assessed/performed Overall Cognitive Status: Within Functional Limits for tasks assessed  Exercises     Shoulder Instructions       General Comments edema in BLE and scrotum    Pertinent Vitals/ Pain       Pain Assessment: Faces Pain Score: 7  Pain Location: L thigh with gait, scrotum/penis from catheter Pain Descriptors / Indicators: Sore;Tender Pain Intervention(s): Limited activity within patient's tolerance;Monitored during session;Repositioned;Ice applied  Home Living Family/patient expects to be  discharged to:: Private residence                                        Prior Functioning/Environment              Frequency  Min 2X/week        Progress Toward Goals  OT Goals(current goals can now be found in the care plan section)  Progress towards OT goals: Progressing toward goals  Acute Rehab OT Goals Patient Stated Goal: to get a new liver OT Goal Formulation: With patient Time For Goal Achievement: 12/13/17 Potential to Achieve Goals: Good  Plan Discharge plan remains appropriate;Frequency remains appropriate    Co-evaluation                 AM-PAC PT "6 Clicks" Daily Activity     Outcome Measure   Help from another person eating meals?: None Help from another person taking care of personal grooming?: A Little Help from another person toileting, which includes using toliet, bedpan, or urinal?: A Little Help from another person bathing (including washing, rinsing, drying)?: A Lot Help from another person to put on and taking off regular upper body clothing?: A Little Help from another person to put on and taking off regular lower body clothing?: A Lot 6 Click Score: 17    End of Session Equipment Utilized During Treatment: Rolling walker  OT Visit Diagnosis: Other abnormalities of gait and mobility (R26.89);Pain Pain - Right/Left: Left Pain - part of body: Leg   Activity Tolerance Patient tolerated treatment well   Patient Left in bed;with call bell/phone within reach;with bed alarm set   Nurse Communication Mobility status;Other (comment)(BM)        Time: 1451-1530 OT Time Calculation (min): 39 min  Charges: OT General Charges $OT Visit: 1 Visit OT Treatments $Self Care/Home Management : 23-37 mins $Therapeutic Activity: 8-22 mins  Hulda Humphrey OTR/L [place] 12/01/2017, 4:28 PM

## 2017-12-01 NOTE — Progress Notes (Signed)
Physical Therapy Treatment Patient Details Name: Logan Patrick MRN: [id number] DOB: Apr 09, 1951 Today's Date: 12/01/2017    History of Present Illness Pt is a 67 y.o. male admitted 11/21/17 after falling at work noted to have hemorrhagic shock secondary to massive left thigh hematoma secondary to coagulopathy and thrombocytopenia from advanced liver cirrhosis. Pt sufferred a syncopal vs seizure in ED that resolved with IVF resucitation. MRI shows no acute intracranial abnormality. Pt also with hepatic failure in setting of alcoholic cirrhosis. PMH includes liver failure, HTN.     PT Comments    Pt pleasant and with remarkable improvement in mobility today. Pt able to utilize belt as leg lifter for moving OOB without physical assist, stand and walk down hall all without physical assist to perform. Pt reports decreased pain and educated for continued mobility with nursing. Encouraged OOB to chair throughout the day and will continue to follow with plan for trying stairs next session.      Follow Up Recommendations  Home health PT;Supervision for mobility/OOB     Equipment Recommendations  Rolling walker with 5" wheels;3in1 (PT)    Recommendations for Other Services       Precautions / Restrictions Precautions Precautions: Fall Precaution Comments: L thigh hematoma    Mobility  Bed Mobility Overal bed mobility: Needs Assistance Bed Mobility: Supine to Sit     Supine to sit: HOB elevated;Supervision     General bed mobility comments: pt with cues to utilize belt for leg lifter and was able to move LLE with belt without physical assist, HOB 30 degrees, increased time  Transfers Overall transfer level: Needs assistance   Transfers: Sit to/from Stand Sit to Stand: Min guard         General transfer comment: pt with increased ability to rise from bed today  Ambulation/Gait Ambulation/Gait assistance: Min guard Ambulation Distance (Feet): 400 Feet Assistive device: Rolling  walker (2 wheeled) Gait Pattern/deviations: Wide base of support;Decreased weight shift to left;Step-through pattern;Decreased stance time - left   Gait velocity interpretation: <1.31 ft/sec, indicative of household ambulator General Gait Details: pt with wide BOS due to edema with increased gait and able to walk entire hall with slow gait and a few standing pauses during gait. Pt with cues x 3 for position in hallway with RW   Stairs             Wheelchair Mobility    Modified Rankin (Stroke Patients Only)       Balance Overall balance assessment: Needs assistance   Sitting balance-Leahy Scale: Good       Standing balance-Leahy Scale: Fair                              Cognition Arousal/Alertness: Awake/alert Behavior During Therapy: WFL for tasks assessed/performed Overall Cognitive Status: Within Functional Limits for tasks assessed                                        Exercises      General Comments        Pertinent Vitals/Pain Pain Score: 5  Pain Location: L thigh with gait Pain Descriptors / Indicators: Sore Pain Intervention(s): Limited activity within patient's tolerance;Monitored during session;Repositioned;Other (comment)(ice packs left for pt in room after toileting)    Home Living  Prior Function            PT Goals (current goals can now be found in the care plan section) Progress towards PT goals: Progressing toward goals    Frequency    Min 3X/week      PT Plan Current plan remains appropriate    Co-evaluation              AM-PAC PT "6 Clicks" Daily Activity  Outcome Measure  Difficulty turning over in bed (including adjusting bedclothes, sheets and blankets)?: A Lot Difficulty moving from lying on back to sitting on the side of the bed? : A Lot Difficulty sitting down on and standing up from a chair with arms (e.g., wheelchair, bedside commode, etc,.)?: A  Lot Help needed moving to and from a bed to chair (including a wheelchair)?: A Little Help needed walking in hospital room?: A Little Help needed climbing 3-5 steps with a railing? : A Lot 6 Click Score: 14    End of Session Equipment Utilized During Treatment: Gait belt Activity Tolerance: Patient tolerated treatment well Patient left: Other (comment);with call bell/phone within reach(on [facility] with tray and call bell with NT and RN aware) Nurse Communication: Mobility status PT Visit Diagnosis: Other abnormalities of gait and mobility (R26.89);Unsteadiness on feet (R26.81)     Time: 5885-0277 PT Time Calculation (min) (ACUTE ONLY): 24 min  Charges:  $Gait Training: 8-22 mins $Therapeutic Activity: 8-22 mins                    G Codes:       Elwyn Reach, PT [phone]    [place] 12/01/2017, 10:38 AM

## 2017-12-01 NOTE — Progress Notes (Signed)
Tech called RN into room because he noticed a 7lb increase in weight for the patient. RN assessed patient and noticed his left arm was twice the size as last night with +4 pitting edema. Patient states that he is doing great. Will page on-call MD. Will also notify on-coming RN to follow-up. TRH1

## 2017-12-01 NOTE — Progress Notes (Signed)
PROGRESS NOTE    Logan Patrick  XFG:[id number] DOB: 1950/09/29 DOA: 11/21/2017 PCP: Logan Sacramento, MD      Brief Narrative:  [name] is a 67 y.o. M with alcoholic cirrhosis, HTN who presented with syncope, found to have hemorrhagic shock from left thigh hematoma.   -Given 16units FFP and 9 units PRBCs total during hospitalization so far -Ortho consulted, not recommending evacuation   Assessment & Plan:  Left thigh hematoma Hemorrhagic shock Acute blood loss anemia Mild trauma preceding the hematoma (was at work, lifting a pallet or box, dizzy and fell, landed on left thigh).  Developed hematoma due to coagulopathy.  His coagulopathy has been discussed informally with Hematology, and because it is clearly due to underlying liver failure it will not reverse for any appreciable time with medical treatments or transfusion.  Leg feels better today, more mobile, no distal perfusion compromise.  Antibiotics stopped yesterday, no new fever. Hgb appropriate bump post transfusion. -Appreciate orthopedics assistance -Maintain compression with ACE wrap  Alcoholic cirrhosis Thrombocytopenia Patient diagnosed with cirrhosis in 2014, quit drinking at that time per Dr. Benson Patrick, abstinent since.  Was well compensated until this spring, had used a cold therapy OTC, developed jaundice, and at that time, consideration of transplant was raised, although no formal referral has yet been made.  Have discussed with Dr. Merrilee Patrick, we are transfer to their service soon. -Continue lactulose and rifaximin -Continue furosemide  Syncope on admission From ABLA.  Acute renal failure Worsening again.  Unclear if this is ischemic injury in setting of severe anemia, or congestion.   -Follow UA, urine electrolytes -Continue diuresis -Strict I/Os, daily weights  Hypomagnesemia -Continue oral Mag  Protein calorie malnutrition -Continue Prostat  Hyponatremia From cirrhosis.       DVT prophylaxis:  SCDs Code Status: FULL Family Communication: Daughter by phone MDM and disposition Plan: Below labs and imaging were reviewed and summarized above.  The patient's clinical status is stable, but he has end stage liver disease, now with decompensated liver failure and coagulopathy causing hematoma.  MELD score is 35, indicating roughly 50% three month mortality.  I have discussed this morning with Dr. Merrilee Patrick from [place], and transfer is being initiated.        Consultants:   Gastroenterology  Procedures:   Echo 5/1 Study Conclusions  - Left ventricle: The cavity size was normal. There was mild   concentric hypertrophy. Systolic function was vigorous. The   estimated ejection fraction was in the range of 65% to 70%. Wall   motion was normal; there were no regional wall motion   abnormalities. - Aortic valve: There was very mild stenosis. There was no   regurgitation. Peak velocity (S): 251 cm/s. Mean gradient (S): 12   mm Hg. Valve area (VTI): 2.59 cm^2. Valve area (Vmax): 2.66 cm^2.   Valve area (Vmean): 2.88 cm^2. - Mitral valve: Transvalvular velocity was within the normal range.   There was no evidence for stenosis. There was mild regurgitation. - Left atrium: The atrium was moderately dilated. - Right ventricle: The cavity size was normal. Wall thickness was   normal. Systolic function was normal. - Atrial septum: No defect or patent foramen ovale was identified. - Pulmonary arteries: Systolic pressure was within the normal   range. PA peak pressure: 28 mm Hg (S).    Carotid US 5/1 Technically difficult due to severe respiratory interference  Final Interpretation: Technically difficult due to above mention limitations. Right Carotid: Velocities in the right ICA  are consistent with a 1-39% stenosis.  Left Carotid: Velocities in the left ICA are consistent with a 1-39% stenosis.  Vertebrals: Bilateral vertebral arteries demonstrate antegrade flow. Subclavians:  Normal flow hemodynamics were seen in bilateral subclavian       arteries.  *See table(s) above for measurements and observations.    LE doppler US Very large left thigh hematoma.  Left Technical Findings: Difficult to compress left femoral vein due to large thigh hematoma. Femoral vein appears patent with color flow Doppler.  Final Interpretation: Right: There is no evidence of deep vein thrombosis in the lower extremity. No cystic structure found in the popliteal fossa. Left: There is no evidence of a obvious deep vein thrombosis in the lower extremity . A very large hematoma in the left thigh  *See table(s) above for measurements and observations.  Electronically signed by Logan Patrick on 11/30/2017 at 6:00:30 PM.     Antimicrobials:   Vancomycin 5/6 >> 5/8    Subjective: Leg feels "looser" more mobile, less pain after ACE wrap.  No fever, no confusion.  No malaise, vomiting, abdominal swelling.  Still no pain in left calf/foot, no pallor, paralysis there.    Objective: Vitals:   11/30/17 1411 11/30/17 1708 11/30/17 1918 12/01/17 0637  BP: (!) 108/56 (!) 103/59 123/76 (!) 106/57  Pulse: (!) 101 98 (!) 107 99  Resp: 18 17 18 18   Temp: 98 F (36.7 C) 97.8 F (36.6 C) 98 F (36.7 C) 97.7 F (36.5 C)  TempSrc: Oral Oral Oral Oral  SpO2: 98% 97% 95% 95%  Weight:    129.5 kg (285 lb 6.4 oz)  Height:        Intake/Output Summary (Last 24 hours) at 12/01/2017 0804 Last data filed at 12/01/2017 0500 Gross per 24 hour  Intake 900 ml  Output 1250 ml  Net -350 ml   Filed Weights   11/29/17 0428 11/30/17 0211 12/01/17 0637  Weight: 126.6 kg (279 lb) 125.2 kg (276 lb 0.3 oz) 129.5 kg (285 lb 6.4 oz)    Examination: General appearance: Adult male, alert and in no acute distress.   HEENT: Icterus.  Lids and lashes normal.  OP moist, dentures in place, lips normal, no oral lesions.    Skin: Warm and dry, jaundice, bruising/ecchymosis on arms, legs.  Leg  wrapped. Cardiac: RRR, no murmurs, JVP not visible.  3+ edema to thighs.   Still can't feel lower DP pulses but they are warm and mobile and dopplerable per nursing.   Respiratory: Normal respiratory effort.  CTAB without wheezing.    Abdomen: Abdomen soft, no ascites.  No tenderness. MSK: Left thigh in ACE wrap. Neuro: Oriented to person, place and time.  EOMI, pupils equal and reactive.  CN normal.  Moves all extremities except left leg limited by pain.   Psych: Judgment and insight normal.    Data Reviewed: I have personally reviewed following labs and imaging studies:  CBC: Recent Labs  Lab 11/25/17 0445  11/29/17 0526 11/29/17 1428 11/29/17 2353 11/30/17 0511 11/30/17 1955 12/01/17 0559  WBC 8.1   < > 15.5* 22.1*  --  15.4* 15.5* 14.6*  NEUTROABS 5.4  --   --   --   --   --  12.3*  --   HGB 6.6*   < > 7.4* 8.1* 8.0* 7.7* 9.7* 9.1*  HCT 19.1*   < > 22.7* 24.3* 23.8* 23.0* 27.6* 27.0*  MCV 96.5   < > 96.2 96.8  --  94.7  92.3 92.2  PLT 66*   < > 83* 104*  --  92* 96* 100*   < > = values in this interval not displayed.   Basic Metabolic Panel: Recent Labs  Lab 11/27/17 0437 11/28/17 0507 11/29/17 0526 11/30/17 0511 12/01/17 0559  NA 136 136 131* 130* 131*  K 3.0* 3.7 3.5 3.7 3.4*  CL 97* 98* 94* 93* 93*  CO2 28 27 25 26 27   GLUCOSE 107* 84 100* 84 109*  BUN 23* 26* 38* 54* 61*  CREATININE 1.17 1.09 1.44* 2.06* 2.36*  CALCIUM 8.9 8.7* 8.2* 8.0* 8.4*  MG  --   --   --  1.0*  --    GFR: Estimated Creatinine Clearance: 41.9 mL/min (A) (by C-G formula based on SCr of 2.36 mg/dL (H)). Liver Function Tests: Recent Labs  Lab 11/26/17 0408 11/28/17 0507 11/29/17 0526 11/29/17 0743 11/30/17 0511 12/01/17 0559  AST 53* 54* 58*  --  74* 76*  ALT 27 28 27   --  30 34  ALKPHOS 125 98 84  --  86 90  BILITOT 12.0* 15.9* 18.0* 17.8* 18.4* 21.6*  PROT 5.5* 5.7* 5.4*  --  4.9* 5.2*  ALBUMIN 2.4* 2.5* 2.2*  --  2.1* 2.0*   No results for input(s): LIPASE, AMYLASE in the  last 168 hours. No results for input(s): AMMONIA in the last 168 hours. Coagulation Profile: Recent Labs  Lab 11/27/17 0437 11/28/17 0507 11/29/17 0526 11/30/17 0511 12/01/17 0559  INR 2.16 1.99 2.30 2.33 2.29   Cardiac Enzymes: No results for input(s): CKTOTAL, CKMB, CKMBINDEX, TROPONINI in the last 168 hours. BNP (last 3 results) No results for input(s): PROBNP in the last 8760 hours. HbA1C: No results for input(s): HGBA1C in the last 72 hours. CBG: No results for input(s): GLUCAP in the last 168 hours. Lipid Profile: No results for input(s): CHOL, HDL, LDLCALC, TRIG, CHOLHDL, LDLDIRECT in the last 72 hours. Thyroid Function Tests: No results for input(s): TSH, T4TOTAL, FREET4, T3FREE, THYROIDAB in the last 72 hours. Anemia Panel: No results for input(s): VITAMINB12, FOLATE, FERRITIN, TIBC, IRON, RETICCTPCT in the last 72 hours. Urine analysis:    Component Value Date/Time   COLORURINE AMBER (A) 11/21/2017 1948   APPEARANCEUR CLOUDY (A) 11/21/2017 1948   LABSPEC 1.025 11/21/2017 1948   PHURINE 5.5 11/21/2017 1948   GLUCOSEU 100 (A) 11/21/2017 1948   HGBUR MODERATE (A) 11/21/2017 1948   BILIRUBINUR LARGE (A) 11/21/2017 1948   KETONESUR 15 (A) 11/21/2017 1948   PROTEINUR 30 (A) 11/21/2017 1948   UROBILINOGEN 1.0 11/30/2012 2041   NITRITE POSITIVE (A) 11/21/2017 1948   LEUKOCYTESUR NEGATIVE 11/21/2017 1948   Sepsis Labs: @LABRCNTIP (procalcitonin:4,lacticacidven:4)  ) Recent Results (from the past 240 hour(s))  Culture, blood (routine x 2)     Status: None   Collection Time: 11/21/17  3:20 PM  Result Value Ref Range Status   Specimen Description   Final    BLOOD LEFT ANTECUBITAL Performed at [facility], [address] [place], [place] [address]    Special Requests   Final    BOTTLES DRAWN AEROBIC AND ANAEROBIC Blood Culture adequate volume Performed at [facility], [address] [place], [place] [address]    Culture   Final    NO  GROWTH 5 DAYS Performed at [facility], [place] [address], [place], [place] [address]    Report Status 11/26/2017 FINAL  Final  Culture, blood (routine x 2)  Status: None   Collection Time: 11/21/17  3:40 PM  Result Value Ref Range Status   Specimen Description   Final    BLOOD LEFT HAND Performed at [facility], [address] [place], [place] [address]    Special Requests   Final    BOTTLES DRAWN AEROBIC AND ANAEROBIC Blood Culture adequate volume Performed at [facility], [address] [place], [place] [address]    Culture   Final    NO GROWTH 5 DAYS Performed at [facility], [place] [address], [place], [place] [address]    Report Status 11/26/2017 FINAL  Final  MRSA PCR Screening     Status: None   Collection Time: 11/22/17  3:34 PM  Result Value Ref Range Status   MRSA by PCR NEGATIVE NEGATIVE Final    Comment:        The GeneXpert MRSA Assay (FDA approved for NASAL specimens only), is one component of a comprehensive MRSA colonization surveillance program. It is not intended to diagnose MRSA infection nor to guide or monitor treatment for MRSA infections. Performed at [facility], [place] [address], [place], [place] [address]          Radiology Studies: No results found.      Scheduled Meds: . feeding supplement (PRO-STAT SUGAR FREE 64)  30 mL Oral BID  . furosemide  20 mg Oral Daily  . lactulose  30 g Oral BID  . magnesium oxide  400 mg Oral Daily  . pantoprazole  40 mg Oral BID  . polyethylene glycol  17 g Oral BID  . rifaximin  550 mg Oral BID   Continuous Infusions: . sodium chloride       LOS: 10 days    Time spent: 25 minutes    Edwin Dada, MD [facility] 12/01/2017, 8:00 AM     Pager [phone] --- please page though AMION:  [email or web address] If 7PM-7AM, please contact night-coverage

## 2017-12-01 NOTE — Care Management Important Message (Signed)
Important Message  Patient Details  Name: Logan Patrick MRN: [id number] Date of Birth: Jun 17, 1951   Medicare Important Message Given:  Yes    [name] [name] 12/01/2017, 1:34 PM

## 2017-12-02 LAB — COMPREHENSIVE METABOLIC PANEL
ALT: 38 U/L (ref 17–63)
ANION GAP: 12 (ref 5–15)
AST: 95 U/L — ABNORMAL HIGH (ref 15–41)
Albumin: 2.1 g/dL — ABNORMAL LOW (ref 3.5–5.0)
Alkaline Phosphatase: 112 U/L (ref 38–126)
BUN: 68 mg/dL — ABNORMAL HIGH (ref 6–20)
CHLORIDE: 96 mmol/L — AB (ref 101–111)
CO2: 26 mmol/L (ref 22–32)
Calcium: 8.8 mg/dL — ABNORMAL LOW (ref 8.9–10.3)
Creatinine, Ser: 2.59 mg/dL — ABNORMAL HIGH (ref 0.61–1.24)
GFR calc non Af Amer: 24 mL/min — ABNORMAL LOW (ref >60)
GFR, EST AFRICAN AMERICAN: 28 mL/min — AB (ref >60)
Glucose, Bld: 93 mg/dL (ref 65–99)
Potassium: 3.4 mmol/L — ABNORMAL LOW (ref 3.5–5.1)
Sodium: 134 mmol/L — ABNORMAL LOW (ref 135–145)
Total Bilirubin: 22.9 mg/dL (ref 0.3–1.2)
Total Protein: 5.8 g/dL — ABNORMAL LOW (ref 6.5–8.1)

## 2017-12-02 LAB — CBC
HEMATOCRIT: 28.1 % — AB (ref 39.0–52.0)
HEMOGLOBIN: 9.5 g/dL — AB (ref 13.0–17.0)
MCH: 31.6 pg (ref 26.0–34.0)
MCHC: 33.8 g/dL (ref 30.0–36.0)
MCV: 93.4 fL (ref 78.0–100.0)
Platelets: 114 10*3/uL — ABNORMAL LOW (ref 150–400)
RBC: 3.01 MIL/uL — AB (ref 4.22–5.81)
RDW: 20.5 % — ABNORMAL HIGH (ref 11.5–15.5)
WBC: 12.3 10*3/uL — ABNORMAL HIGH (ref 4.0–10.5)

## 2017-12-02 MED ORDER — SODIUM CHLORIDE 0.9% FLUSH: 10.0000 mL | INTRAVENOUS | Status: DC | PRN

## 2017-12-02 MED ORDER — ALBUMIN HUMAN 25 % IV SOLN
25.0000 g | Freq: Once | INTRAVENOUS | Status: AC
  Administered 2017-12-02: 25 g via INTRAVENOUS
  Filled 2017-12-02: qty 100

## 2017-12-02 MED ORDER — ALBUMIN HUMAN 25 % IV SOLN
25.0000 g | Freq: Four times a day (QID) | INTRAVENOUS | Status: AC
  Administered 2017-12-02 – 2017-12-03 (×3): 25 g via INTRAVENOUS
  Filled 2017-12-02 (×3): qty 100

## 2017-12-02 MED ORDER — SODIUM CHLORIDE 0.9 % IV SOLN
50.0000 ug/h | INTRAVENOUS | Status: DC
  Administered 2017-12-02 – 2017-12-05 (×7): 50 ug/h via INTRAVENOUS
  Filled 2017-12-02 (×12): qty 1

## 2017-12-02 MED ORDER — SODIUM CHLORIDE 0.9% FLUSH
10.0000 mL | Freq: Two times a day (BID) | INTRAVENOUS | Status: DC
  Administered 2017-12-04: 10 mL

## 2017-12-02 MED ORDER — MIDODRINE HCL 5 MG PO TABS
10.0000 mg | ORAL_TABLET | Freq: Three times a day (TID) | ORAL | Status: DC
  Administered 2017-12-02 – 2017-12-05 (×10): 10 mg via ORAL
  Filled 2017-12-02 (×9): qty 2

## 2017-12-02 NOTE — Progress Notes (Signed)
Successful fax sent, MD aware Fax report document saved in pt shadow chart

## 2017-12-02 NOTE — Progress Notes (Signed)
Subjective: No complaints.  Feeling well.  Objective: Vital signs in last 24 hours: Temp:  [97.8 F (36.6 C)-98.9 F (37.2 C)] 98.9 F (37.2 C) (05/10 1630) Pulse Rate:  [89-96] 90 (05/10 1630) Resp:  [18-20] 18 (05/10 1630) BP: (107-116)/(60-72) 116/72 (05/10 1630) SpO2:  [98 %] 98 % (05/10 0500) Weight:  [130.4 kg (287 lb 7.7 oz)] 130.4 kg (287 lb 7.7 oz) (05/10 0500) Last BM Date: 12/02/17  Intake/Output from previous day: 05/09 0701 - 05/10 0700 In: 150 [P.O.:150] Out: 1500 [Urine:1500] Intake/Output this shift: No intake/output data recorded.  General appearance: alert and no distress GI: soft, non-tender; bowel sounds normal; no masses,  no organomegaly  Lab Results: Recent Labs    11/30/17 1955 12/01/17 0559 12/02/17 0600  WBC 15.5* 14.6* 12.3*  HGB 9.7* 9.1* 9.5*  HCT 27.6* 27.0* 28.1*  PLT 96* 100* 114*   BMET Recent Labs    11/30/17 0511 12/01/17 0559 12/02/17 0600  NA 130* 131* 134*  K 3.7 3.4* 3.4*  CL 93* 93* 96*  CO2 26 27 26   GLUCOSE 84 109* 93  BUN 54* 61* 68*  CREATININE 2.06* 2.36* 2.59*  CALCIUM 8.0* 8.4* 8.8*   LFT Recent Labs    12/02/17 0600  PROT 5.8*  ALBUMIN 2.1*  AST 95*  ALT 38  ALKPHOS 112  BILITOT 22.9*   PT/INR Recent Labs    11/30/17 0511 12/01/17 0559  LABPROT 25.4* 25.0*  INR 2.33 2.29   Hepatitis Panel No results for input(s): HEPBSAG, HCVAB, HEPAIGM, HEPBIGM in the last 72 hours. C-Diff No results for input(s): CDIFFTOX in the last 72 hours. Fecal Lactopherrin No results for input(s): FECLLACTOFRN in the last 72 hours.  Studies/Results: No results found.  Medications:  Scheduled: . feeding supplement (PRO-STAT SUGAR FREE 64)  30 mL Oral BID  . lactulose  30 g Oral BID  . magnesium oxide  400 mg Oral Daily  . midodrine  10 mg Oral TID WC  . pantoprazole  40 mg Oral BID  . polyethylene glycol  17 g Oral BID  . rifaximin  550 mg Oral BID   Continuous: . sodium chloride    . albumin human    .  octreotide  (SANDOSTATIN)    IV infusion 50 mcg/hr (12/02/17 1800)    Assessment/Plan: 1) Decompensated cirrhosis. 2) Nonoliguric HRS. 3) Left thigh hematoma.   The patient does not display any evidence of hepatic encephalopathy.  Creatinine did increase and his urine sodium is <10.  Still awaiting transfer to [facility].  Plan: 1) Agree with midodrine, albumin, and octreotide. 2) Continue with supportive care.  LOS: 11 days   Aiken Withem D 12/02/2017, 9:06 PM

## 2017-12-02 NOTE — Progress Notes (Signed)
Physical Therapy Treatment Patient Details Name: Logan Patrick MRN: [id number] DOB: April 27, 1951 Today's Date: 12/02/2017    History of Present Illness Pt is a 67 y.o. male admitted 11/21/17 after falling at work noted to have hemorrhagic shock secondary to massive left thigh hematoma secondary to coagulopathy and thrombocytopenia from advanced liver cirrhosis. Pt sufferred a syncopal vs seizure in ED that resolved with IVF resucitation. MRI shows no acute intracranial abnormality. Pt also with hepatic failure in setting of alcoholic cirrhosis. PMH includes liver failure, HTN.     PT Comments    Pt pleasant and agreeable to treatment. Pt shows continued improvement in mobility as evidenced by ability to ambulate 400 ft with increased speed and to ascend/descend 2 stairs Min Guard. Pt continues to report L thigh pain and reported possibly being transferred to [facility] for liver treatment.   Follow Up Recommendations  Home health PT;Supervision for mobility/OOB     Equipment Recommendations  Rolling walker with 5" wheels;3in1 (PT)    Recommendations for Other Services       Precautions / Restrictions Precautions Precautions: Fall Precaution Comments: L thigh hematoma Restrictions Weight Bearing Restrictions: No    Mobility  Bed Mobility Overal bed mobility: Needs Assistance Bed Mobility: Supine to Sit     Supine to sit: HOB elevated;Supervision     General bed mobility comments: pt using rail to fully elevate trunk, able to move LLE off of bed without use of belt today, increaed time, cues to breathe as pt holding his breath  Transfers Overall transfer level: Needs assistance   Transfers: Sit to/from Stand Sit to Stand: Min guard         General transfer comment: min guard for safety with cues for hand placement. Pt attempted to stand with RW too far anterior resulting in PT having to hold down anterior RW to anchor it. Pt verbalized understanding of correction for future  attempts  Ambulation/Gait Ambulation/Gait assistance: Min guard Ambulation Distance (Feet): 400 Feet Assistive device: Rolling walker (2 wheeled) Gait Pattern/deviations: Wide base of support;Decreased weight shift to left;Step-through pattern;Decreased stance time - left Gait velocity: Decreased   General Gait Details: pt with wide BOS due to edema with increased gait and able to walk entire hall with slow gait and a few standing pauses during gait. Pt able to ambulate and carry conversation with slight increase in work of breathing noted. Pt reported no adverse symptoms during gait.   Stairs Stairs: Yes Stairs assistance: Min guard Stair Management: One rail Right;One rail Left;Step to pattern;Sideways;Forwards Number of Stairs: 2 General stair comments: Pt ascended 2 stairs with use of R hand rail leading with RLE. Pt descended 2 stairs using L hand rail with the 1st stair being attempted sideways with pt having difficulty clearing L foot off stair. Pt descended 2nd step forwards leading with LLE with improved stability and ease   Wheelchair Mobility    Modified Rankin (Stroke Patients Only)       Balance Overall balance assessment: Needs assistance Sitting-balance support: No upper extremity supported Sitting balance-Leahy Scale: Good     Standing balance support: Bilateral upper extremity supported   Standing balance comment: Able to stand with RW and adjust gown with one UE while the other remains on RW.                            Cognition Arousal/Alertness: Awake/alert Behavior During Therapy: WFL for tasks assessed/performed Overall Cognitive Status: Within  Functional Limits for tasks assessed                                        Exercises      General Comments        Pertinent Vitals/Pain Pain Assessment: 0-10 Pain Score: 5  Pain Location: L thigh and head from gritting teeth due to pain Pain Descriptors / Indicators:  Sore;Tender Pain Intervention(s): Limited activity within patient's tolerance;Monitored during session    Home Living                      Prior Function            PT Goals (current goals can now be found in the care plan section) Acute Rehab PT Goals Patient Stated Goal: "To do all that I can" PT Goal Formulation: With patient Time For Goal Achievement: 12/16/17 Potential to Achieve Goals: Good Progress towards PT goals: Progressing toward goals    Frequency    Min 3X/week      PT Plan Current plan remains appropriate    Co-evaluation              AM-PAC PT "6 Clicks" Daily Activity  Outcome Measure  Difficulty turning over in bed (including adjusting bedclothes, sheets and blankets)?: A Lot Difficulty moving from lying on back to sitting on the side of the bed? : A Lot Difficulty sitting down on and standing up from a chair with arms (e.g., wheelchair, bedside commode, etc,.)?: A Little Help needed moving to and from a bed to chair (including a wheelchair)?: A Little Help needed walking in hospital room?: A Little Help needed climbing 3-5 steps with a railing? : A Lot 6 Click Score: 15    End of Session Equipment Utilized During Treatment: Gait belt Activity Tolerance: Patient tolerated treatment well Patient left: in chair;with call bell/phone within reach;with family/visitor present   PT Visit Diagnosis: Other abnormalities of gait and mobility (R26.89);Unsteadiness on feet (R26.81)     Time: 0370-4888 PT Time Calculation (min) (ACUTE ONLY): 30 min  Charges:  $Gait Training: 8-22 mins $Therapeutic Activity: 8-22 mins                    G Codes:       Gabe Alleen Kehm, SPT   [name] 12/02/2017, 12:10 PM

## 2017-12-02 NOTE — Progress Notes (Signed)
[facility] Progress Note   Assessment/ Plan:    67 year old male with relatively normal GFR admitted 4/29 after syncope and found to have massive hematoma  of the left thigh with ABLA requiring significant transfusions.  Further, now has decompensated cirrhosis with coagulopathy, rising total bilirubin (partially related to resolution of hematoma), and renal failure.  He is nonoliguric.  He has had 2 distinct episodes of renal failure.  I think the earlier episode was likely related to contrast exposure at the time of presentation.  More recently renal function has been declining again.  It is nonoliguric.  Differential here would be hepatorenal syndrome (less favored given nonoliguric state), ATN, potential pigment nephropathy from resolution of hematoma/rhabdomyolysis from damage to muscle.    1. AKI, no uremic symptoms.  DDx as above: HRS, ATN, pigment nephropathy.  UA with moderate Hgb.  CK within normal.  2. Hyponatremia, mild; hypervolemic; monitor.  UNa is low, making HRS more likely.  3. Decompensated ETOH cirrhosis, GI following. Octreotide and midodrine started.  4. Massive hematoma of L thigh req sig pRBC and FFP transfusions  5. Syncope    Subjective:   Patient asleep, awakens easily.  Feels well.  No current complaints.     Objective:   BP 110/64 (BP Location: Right Arm)   Pulse 89   Temp 98.4 F (36.9 C)   Resp 20   Ht 5' 10.5" (1.791 m)   Wt 287 lb 7.7 oz (130.4 kg)   SpO2 98%   BMI 40.67 kg/m   Intake/Output Summary (Last 24 hours) at 12/02/2017 1700 Last data filed at 12/02/2017 1100 Gross per 24 hour  Intake 490 ml  Output 1500 ml  Net -1010 ml   Weight change: 2 lb 1.3 oz (0.943 kg)  Physical Exam: General: 67 yo male, lying in bed, NAD HEENT: EOMI, PERRL, marked scleral icterus, MMM, o/p clear  Skin: Skin is warm, dry, diffuse bruising present over arms and legs Cardiac: RRR no MRG, 3+ edema to the thighs bilaterally.   Respiratory: Normal  effort, CTAB  Abdomen:  soft, NTND, +bs  Neuro: Alert, Ox4, no focal deficits  Imaging: No results found.  Labs: BMET Recent Labs  Lab 11/26/17 0408 11/27/17 2130 11/28/17 0507 11/29/17 0526 11/30/17 0511 12/01/17 0559 12/02/17 0600  NA 140 136 136 131* 130* 131* 134*  K 2.8* 3.0* 3.7 3.5 3.7 3.4* 3.4*  CL 102 97* 98* 94* 93* 93* 96*  CO2 28 28 27 25 26 27 26   GLUCOSE 103* 107* 84 100* 84 109* 93  BUN 23* 23* 26* 38* 54* 61* 68*  CREATININE 1.54* 1.17 1.09 1.44* 2.06* 2.36* 2.59*  CALCIUM 9.1 8.9 8.7* 8.2* 8.0* 8.4* 8.8*   CBC Recent Labs  Lab 11/30/17 0511 11/30/17 1955 12/01/17 0559 12/02/17 0600  WBC 15.4* 15.5* 14.6* 12.3*  NEUTROABS  --  12.3*  --   --   HGB 7.7* 9.7* 9.1* 9.5*  HCT 23.0* 27.6* 27.0* 28.1*  MCV 94.7 92.3 92.2 93.4  PLT 92* 96* 100* 114*    Medications:    . feeding supplement (PRO-STAT SUGAR FREE 64)  30 mL Oral BID  . lactulose  30 g Oral BID  . magnesium oxide  400 mg Oral Daily  . midodrine  10 mg Oral TID WC  . pantoprazole  40 mg Oral BID  . polyethylene glycol  17 g Oral BID  . rifaximin  550 mg Oral BID    Lovenia Kim, MD [facility],  PGY-2

## 2017-12-02 NOTE — Progress Notes (Signed)
Patient continues to require indwelling foley cath due to gross scrotal edema and edema to upper thighs and lower abdominal area.

## 2017-12-02 NOTE — Progress Notes (Signed)
Per MD Danford request for RN to print H&P and 12/02/2017 progress note written by MD Danford and fax to [phone] [facility]

## 2017-12-02 NOTE — Progress Notes (Signed)
PROGRESS NOTE    GARON MELANDER  PYP:[id number] DOB: 01/14/1951 DOA: 11/21/2017 PCP: [name], MD      Brief Narrative:  Mr. Hughlett is a 67 y.o. M with hx of alcoholic cirrhosis, abstinent since 2014 and HTN who presented with fall, developed syncope in ER, found to have left thigh hematoma from the fall causing hypotension/hemorrhagic shock.    Initially admitted for hemorrhagic shock: -Given 8 units FFP to correct coagulopathy and 3 units PRBCs initially -Given additional 8 units FFP in first few days to try to temporarily correct INR, slow bleeding, as well as an additional 5 units of blood until Hgb stabilized by hospital day 4  -Orthopedic surgery were consulted, recommended conservative management, compression hose -After stabilization of his hematoma, it became clear that patient's liver cirrhosis was decompensated, MELD 35 -Furthermore, during that time, he began to develop new renal failure -Referral to transplant center was recommended and initiated -Nephrology were consulted   Assessment & Plan:  Left thigh hematoma Hemorrhagic shock Acute blood loss anemia Mild trauma preceding the hematoma (was at work, lifting a pallet or box, dizzy and fell, landed on left thigh).  Developed hematoma due to coagulopathy.  Due to underlying liver failure it will not reverse for any appreciable time with medical treatments or transfusion.  Leg again better today, swelling improving.  No new fever off antibiotics.  Hgb stable. -Appreciate orthopedics assistance -TED hose when able -Daily CBC  Acute kidney injury Cr still trending up. FeNA 0.3%.  UNa low.  Urine sediment bland.  Urine output -1.5L yesterday. -Consult nephrology, appreciate cares -Start albumin  -Start midodrine, octreotide -Hold diuresis for now -Strict I/Os, daily weights  Alcoholic cirrhosis Thrombocytopenia Patient diagnosed with cirrhosis in 2014, quit drinking at that time per Dr. [name], abstinent  since.  Was well compensated until this spring, had used a cold therapy OTC, developed jaundice, and at that time, consideration of transplant was raised, although no formal referral has yet been made.  Have discussed with Dr. [name], [facility], we are hopeful to transfer to their hospital pending insurance approval.    No ascites, confusion. -Continue home lactulose and rifaximin -Hold furosemide  Syncope on admission From [facility]LA.  Hypomagnesemia -Continue mag  Protein calorie malnutrition -Continue prostat  Hyponatremia From cirrhosis.       DVT prophylaxis: SCDs Code Status: FULL Family Communication: None today MDM and disposition Plan:  The below labs and imaging report reviewed and summarized above.  The patient's clinical status improving.  He was admitted with end-stage liver disease, severe decompensated liver failure and coagulopathy causing hematoma.  His meld score is 735 which indicates a roughly 50% 66-month mortality.  The case has been discussed with [facility], and transfer is being initiated.  In the meantime he has worsening renal failure for which nephrology on board and we are starting additional medications.       Consultants:   Gastroenterology  Procedures:   Echo 5/1 Study Conclusions  - Left ventricle: The cavity size was normal. There was mild   concentric hypertrophy. Systolic function was vigorous. The   estimated ejection fraction was in the range of 65% to 70%. Wall   motion was normal; there were no regional wall motion   abnormalities. - Aortic valve: There was very mild stenosis. There was no   regurgitation. Peak velocity (S): 251 cm/s. Mean gradient (S): 12   mm Hg. Valve area (VTI): 2.59 cm^2. Valve area (Vmax): 2.66 cm^2.  Valve area (Vmean): 2.88 cm^2. - Mitral valve: Transvalvular velocity was within the normal range.   There was no evidence for stenosis. There was mild regurgitation. - Left atrium: The atrium was  moderately dilated. - Right ventricle: The cavity size was normal. Wall thickness was   normal. Systolic function was normal. - Atrial septum: No defect or patent foramen ovale was identified. - Pulmonary arteries: Systolic pressure was within the normal   range. PA peak pressure: 28 mm Hg (S).    Carotid US 5/1 Technically difficult due to severe respiratory interference  Final Interpretation: Technically difficult due to above mention limitations. Right Carotid: Velocities in the right ICA are consistent with a 1-39% stenosis.  Left Carotid: Velocities in the left ICA are consistent with a 1-39% stenosis.  Vertebrals: Bilateral vertebral arteries demonstrate antegrade flow. Subclavians: Normal flow hemodynamics were seen in bilateral subclavian       arteries.  *See table(s) above for measurements and observations.    LE doppler US Very large left thigh hematoma.  Left Technical Findings: Difficult to compress left femoral vein due to large thigh hematoma. Femoral vein appears patent with color flow Doppler.  Final Interpretation: Right: There is no evidence of deep vein thrombosis in the lower extremity. No cystic structure found in the popliteal fossa. Left: There is no evidence of a obvious deep vein thrombosis in the lower extremity . A very large hematoma in the left thigh  *See table(s) above for measurements and observations.  Electronically signed by Ruta Hinds on 11/30/2017 at 6:00:30 PM.     Antimicrobials:   Vancomycin 5/6 >> 5/8    Subjective: The leg feels less swollen, less pain.  No fever, confusion.  No ascites.  No abdominal pain.  No malaise, vomiting.     Objective: Vitals:   12/01/17 0637 12/01/17 2100 12/02/17 0500 12/02/17 1100  BP: (!) 106/57 112/67 107/60 110/64  Pulse: 99 98 96 89  Resp: 18 18 18 20   Temp: 97.7 F (36.5 C) 97.8 F (36.6 C) 97.8 F (36.6 C) 98.4 F (36.9 C)  TempSrc: Oral Oral Oral   SpO2: 95% 95%  98%   Weight: 129.5 kg (285 lb 6.4 oz)  130.4 kg (287 lb 7.7 oz)   Height:        Intake/Output Summary (Last 24 hours) at 12/02/2017 1444 Last data filed at 12/02/2017 1100 Gross per 24 hour  Intake 490 ml  Output 1500 ml  Net -1010 ml   Filed Weights   11/30/17 0211 12/01/17 0637 12/02/17 0500  Weight: 125.2 kg (276 lb 0.3 oz) 129.5 kg (285 lb 6.4 oz) 130.4 kg (287 lb 7.7 oz)    Examination: General appearance: Adult male, lying in bed, no acute distress, interactive. HEENT: Sclera anicteric.  Lids and lashes normal.  Oropharynx is dry, dentures in place, lips normal, no oral lesions.    Skin: Skin is warm and dry, he is jaundiced.  There is diffuse bruising on the arms and legs.  The left thigh has ecchymosis, swelling, improved. Cardiac: Regular rate and rhythm, no murmurs.  4+ edema to the thighs.   Respiratory: Respiratory effort normal, lungs clear without wheezing or rales.    Abdomen: Abdomen is soft and nontender without ascites, hepatosplenomegaly appreciated today. MSK: Left thigh swelling improved. Neuro: He is oriented to person, place and time.  Extraocular movements intact, pupils equal and reactive.  Cranial nerves are normal, moves all extremities except his left leg which is limited by pain.  Speech is fluent. Psych: Sensorium is intact he is responding to questions, attention normal, affect normal, judgment and insight appear normal.    Data Reviewed: I have personally reviewed following labs and imaging studies:  CBC: Recent Labs  Lab 11/29/17 1428 11/29/17 2353 11/30/17 0511 11/30/17 1955 12/01/17 0559 12/02/17 0600  WBC 22.1*  --  15.4* 15.5* 14.6* 12.3*  NEUTROABS  --   --   --  12.3*  --   --   HGB 8.1* 8.0* 7.7* 9.7* 9.1* 9.5*  HCT 24.3* 23.8* 23.0* 27.6* 27.0* 28.1*  MCV 96.8  --  94.7 92.3 92.2 93.4  PLT 104*  --  92* 96* 100* 503*   Basic Metabolic Panel: Recent Labs  Lab 11/28/17 0507 11/29/17 0526 11/30/17 0511 12/01/17 0559  12/02/17 0600  NA 136 131* 130* 131* 134*  K 3.7 3.5 3.7 3.4* 3.4*  CL 98* 94* 93* 93* 96*  CO2 27 25 26 27 26   GLUCOSE 84 100* 84 109* 93  BUN 26* 38* 54* 61* 68*  CREATININE 1.09 1.44* 2.06* 2.36* 2.59*  CALCIUM 8.7* 8.2* 8.0* 8.4* 8.8*  MG  --   --  1.0*  --   --    GFR: Estimated Creatinine Clearance: 38.4 mL/min (A) (by C-G formula based on SCr of 2.59 mg/dL (H)). Liver Function Tests: Recent Labs  Lab 11/28/17 0507 11/29/17 0526 11/29/17 0743 11/30/17 0511 12/01/17 0559 12/02/17 0600  AST 54* 58*  --  74* 76* 95*  ALT 28 27  --  30 34 38  ALKPHOS 98 84  --  86 90 112  BILITOT 15.9* 18.0* 17.8* 18.4* 21.6* 22.9*  PROT 5.7* 5.4*  --  4.9* 5.2* 5.8*  ALBUMIN 2.5* 2.2*  --  2.1* 2.0* 2.1*   No results for input(s): LIPASE, AMYLASE in the last 168 hours. No results for input(s): AMMONIA in the last 168 hours. Coagulation Profile: Recent Labs  Lab 11/27/17 0437 11/28/17 0507 11/29/17 0526 11/30/17 0511 12/01/17 0559  INR 2.16 1.99 2.30 2.33 2.29   Cardiac Enzymes: Recent Labs  Lab 12/01/17 1643  CKTOTAL 87   BNP (last 3 results) No results for input(s): PROBNP in the last 8760 hours. HbA1C: No results for input(s): HGBA1C in the last 72 hours. CBG: No results for input(s): GLUCAP in the last 168 hours. Lipid Profile: No results for input(s): CHOL, HDL, LDLCALC, TRIG, CHOLHDL, LDLDIRECT in the last 72 hours. Thyroid Function Tests: No results for input(s): TSH, T4TOTAL, FREET4, T3FREE, THYROIDAB in the last 72 hours. Anemia Panel: No results for input(s): VITAMINB12, FOLATE, FERRITIN, TIBC, IRON, RETICCTPCT in the last 72 hours. Urine analysis:    Component Value Date/Time   COLORURINE AMBER (A) 12/01/2017 2208   APPEARANCEUR CLEAR 12/01/2017 2208   LABSPEC 1.010 12/01/2017 2208   PHURINE 6.0 12/01/2017 2208   GLUCOSEU NEGATIVE 12/01/2017 2208   HGBUR MODERATE (A) 12/01/2017 2208   BILIRUBINUR NEGATIVE 12/01/2017 2208   KETONESUR NEGATIVE  12/01/2017 2208   PROTEINUR NEGATIVE 12/01/2017 2208   UROBILINOGEN 1.0 11/30/2012 2041   NITRITE NEGATIVE 12/01/2017 2208   LEUKOCYTESUR SMALL (A) 12/01/2017 2208   Sepsis Labs: @LABRCNTIP (procalcitonin:4,lacticacidven:4)  ) Recent Results (from the past 240 hour(s))  MRSA PCR Screening     Status: None   Collection Time: 11/22/17  3:34 PM  Result Value Ref Range Status   MRSA by PCR NEGATIVE NEGATIVE Final    Comment:        The GeneXpert MRSA Assay (FDA approved for  NASAL specimens only), is one component of a comprehensive MRSA colonization surveillance program. It is not intended to diagnose MRSA infection nor to guide or monitor treatment for MRSA infections. Performed at [facility], [place] [address] [place], [place] [address]          Radiology Studies: No results found.      Scheduled Meds: . feeding supplement (PRO-STAT SUGAR FREE 64)  30 mL Oral BID  . lactulose  30 g Oral BID  . magnesium oxide  400 mg Oral Daily  . midodrine  10 mg Oral TID WC  . pantoprazole  40 mg Oral BID  . polyethylene glycol  17 g Oral BID  . rifaximin  550 mg Oral BID   Continuous Infusions: . sodium chloride    . albumin human    . octreotide  (SANDOSTATIN)    IV infusion       LOS: 11 days    Time spent: 35 minutes    Edwin Dada, MD [facility] 12/02/2017, 12:30 PM     Pager [phone] --- please page though AMION:  [email or web address] If 7PM-7AM, please contact night-coverage

## 2017-12-03 LAB — COMPREHENSIVE METABOLIC PANEL
ALK PHOS: 100 U/L (ref 38–126)
ALT: 32 U/L (ref 17–63)
AST: 89 U/L — ABNORMAL HIGH (ref 15–41)
Albumin: 2.7 g/dL — ABNORMAL LOW (ref 3.5–5.0)
Anion gap: 11 (ref 5–15)
BUN: 69 mg/dL — ABNORMAL HIGH (ref 6–20)
CALCIUM: 9.1 mg/dL (ref 8.9–10.3)
CO2: 26 mmol/L (ref 22–32)
CREATININE: 2.78 mg/dL — AB (ref 0.61–1.24)
Chloride: 97 mmol/L — ABNORMAL LOW (ref 101–111)
GFR, EST AFRICAN AMERICAN: 26 mL/min — AB (ref >60)
GFR, EST NON AFRICAN AMERICAN: 22 mL/min — AB (ref >60)
Glucose, Bld: 128 mg/dL — ABNORMAL HIGH (ref 65–99)
Potassium: 3.5 mmol/L (ref 3.5–5.1)
Sodium: 134 mmol/L — ABNORMAL LOW (ref 135–145)
Total Bilirubin: 26.9 mg/dL (ref 0.3–1.2)
Total Protein: 6 g/dL — ABNORMAL LOW (ref 6.5–8.1)

## 2017-12-03 LAB — CBC
HCT: 26.1 % — ABNORMAL LOW (ref 39.0–52.0)
Hemoglobin: 8.8 g/dL — ABNORMAL LOW (ref 13.0–17.0)
MCH: 31.3 pg (ref 26.0–34.0)
MCHC: 33.7 g/dL (ref 30.0–36.0)
MCV: 92.9 fL (ref 78.0–100.0)
PLATELETS: 108 10*3/uL — AB (ref 150–400)
RBC: 2.81 MIL/uL — AB (ref 4.22–5.81)
RDW: 20.3 % — AB (ref 11.5–15.5)
WBC: 10.7 10*3/uL — AB (ref 4.0–10.5)

## 2017-12-03 MED ORDER — FUROSEMIDE 10 MG/ML IJ SOLN
40.0000 mg | Freq: Every day | INTRAMUSCULAR | Status: DC
  Administered 2017-12-03 – 2017-12-05 (×3): 40 mg via INTRAVENOUS
  Filled 2017-12-03 (×3): qty 4

## 2017-12-03 MED ORDER — ALBUMIN HUMAN 25 % IV SOLN
25.0000 g | Freq: Two times a day (BID) | INTRAVENOUS | Status: DC
  Filled 2017-12-03 (×2): qty 100

## 2017-12-03 MED ORDER — ALBUMIN HUMAN 25 % IV SOLN
25.0000 g | Freq: Two times a day (BID) | INTRAVENOUS | Status: AC
  Administered 2017-12-03 – 2017-12-04 (×3): 25 g via INTRAVENOUS
  Filled 2017-12-03 (×3): qty 100

## 2017-12-03 NOTE — Progress Notes (Signed)
Talked to [facility] transfer center yesterday, Dr. [name] from Hepatology/Transplant and Dr. Kerby Less from [facility].  He is accepted there to [facility] bed for transplant.  They have requested to be clear that he may not be a candidate for transplant, and if so, that we would offer to take the patient back for arrangement of Hospice.    However, of note, the bed waitlist at [facility] is possibly as long as 2 weeks.

## 2017-12-03 NOTE — Progress Notes (Signed)
Spoke with Doctor Danford about patients compliance with TED hose. Ordering largest size now.

## 2017-12-03 NOTE — Progress Notes (Signed)
[facility] ROUNDING NOTE   Subjective:   Interval History:  Some nausea complaints this morning   Brief history 67 y.o. M with hx of alcoholic cirrhosis, abstinent since 2014 and HTN decompensated cirrhosis with coagulopathy, rising total bilirubin (partially related to resolution of hematoma), and renal failure. He is nonoliguric and is being currently treated as though he has hepatorenal syndrome type 2     Objective:  Vital signs in last 24 hours:  Temp:  [97.8 F (36.6 C)-98.9 F (37.2 C)] 97.8 F (36.6 C) (05/11 1103) Pulse Rate:  [88-94] 88 (05/11 1103) Resp:  [18-20] 20 (05/11 1103) BP: (107-116)/(54-72) 113/62 (05/11 1103) SpO2:  [92 %-95 %] 92 % (05/11 1103) Weight:  [282 lb 6.4 oz (128.1 kg)] 282 lb 6.4 oz (128.1 kg) (05/11 0522)  Weight change: -5 lb 1.3 oz (-2.304 kg) Filed Weights   12/01/17 0637 12/02/17 0500 12/03/17 0522  Weight: 285 lb 6.4 oz (129.5 kg) 287 lb 7.7 oz (130.4 kg) 282 lb 6.4 oz (128.1 kg)    Intake/Output: I/O last 3 completed shifts: In: 1494.6 [P.O.:970; I.V.:324.6; IV Piggyback:200] Out: 1900 [Urine:1900]   Intake/Output this shift:  Total I/O In: 120 [P.O.:120] Out: -   Alert and oriented  Deeply jaundiced  CVS- RRR no murmurs  RS- CTA no rales  ABD- ascites  EXT-  Diffuse edema    Basic Metabolic Panel: Recent Labs  Lab 11/29/17 0526 11/30/17 0511 12/01/17 0559 12/02/17 0600 12/03/17 0508  NA 131* 130* 131* 134* 134*  K 3.5 3.7 3.4* 3.4* 3.5  CL 94* 93* 93* 96* 97*  CO2 25 26 27 26 26   GLUCOSE 100* 84 109* 93 128*  BUN 38* 54* 61* 68* 69*  CREATININE 1.44* 2.06* 2.36* 2.59* 2.78*  CALCIUM 8.2* 8.0* 8.4* 8.8* 9.1  MG  --  1.0*  --   --   --     Liver Function Tests: Recent Labs  Lab 11/29/17 0526 11/29/17 0743 11/30/17 0511 12/01/17 0559 12/02/17 0600 12/03/17 0508  AST 58*  --  74* 76* 95* 89*  ALT 27  --  30 34 38 32  ALKPHOS 84  --  86 90 112 100  BILITOT 18.0* 17.8* 18.4* 21.6* 22.9*  26.9*  PROT 5.4*  --  4.9* 5.2* 5.8* 6.0*  ALBUMIN 2.2*  --  2.1* 2.0* 2.1* 2.7*   No results for input(s): LIPASE, AMYLASE in the last 168 hours. No results for input(s): AMMONIA in the last 168 hours.  CBC: Recent Labs  Lab 11/30/17 0511 11/30/17 1955 12/01/17 0559 12/02/17 0600 12/03/17 0508  WBC 15.4* 15.5* 14.6* 12.3* 10.7*  NEUTROABS  --  12.3*  --   --   --   HGB 7.7* 9.7* 9.1* 9.5* 8.8*  HCT 23.0* 27.6* 27.0* 28.1* 26.1*  MCV 94.7 92.3 92.2 93.4 92.9  PLT 92* 96* 100* 114* 108*    Cardiac Enzymes: Recent Labs  Lab 12/01/17 1643  CKTOTAL 87    BNP: Invalid input(s): POCBNP  CBG: No results for input(s): GLUCAP in the last 168 hours.  Microbiology: Results for orders placed or performed during the hospital encounter of 11/21/17  Culture, blood (routine x 2)     Status: None   Collection Time: 11/21/17  3:20 PM  Result Value Ref Range Status   Specimen Description   Final    BLOOD LEFT ANTECUBITAL Performed at [facility], [address] [place], [place] [address]    Special Requests  Final    BOTTLES DRAWN AEROBIC AND ANAEROBIC Blood Culture adequate volume Performed at [facility], [place] [place], [place] [address]    Culture   Final    NO GROWTH 5 DAYS Performed at [facility], [place] [address] [place], [place] [address]    Report Status 11/26/2017 FINAL  Final  Culture, blood (routine x 2)     Status: None   Collection Time: 11/21/17  3:40 PM  Result Value Ref Range Status   Specimen Description   Final    BLOOD LEFT HAND Performed at [facility], [address] [place], [place] [address]    Special Requests   Final    BOTTLES DRAWN AEROBIC AND ANAEROBIC Blood Culture adequate volume Performed at [facility], [address] [place], [place] [address]    Culture   Final    NO GROWTH 5 DAYS Performed at [facility], [place] [address] [place], [place] [address]    Report  Status 11/26/2017 FINAL  Final  MRSA PCR Screening     Status: None   Collection Time: 11/22/17  3:34 PM  Result Value Ref Range Status   MRSA by PCR NEGATIVE NEGATIVE Final    Comment:        The GeneXpert MRSA Assay (FDA approved for NASAL specimens only), is one component of a comprehensive MRSA colonization surveillance program. It is not intended to diagnose MRSA infection nor to guide or monitor treatment for MRSA infections. Performed at [facility], [place] [address], [place], [place] [address]     Coagulation Studies: Recent Labs    12/01/17 0559  LABPROT 25.0*  INR 2.29    Urinalysis: Recent Labs    12/01/17 2208  COLORURINE AMBER*  LABSPEC 1.010  PHURINE 6.0  GLUCOSEU NEGATIVE  HGBUR MODERATE*  BILIRUBINUR NEGATIVE  KETONESUR NEGATIVE  PROTEINUR NEGATIVE  NITRITE NEGATIVE  LEUKOCYTESUR SMALL*      Imaging: No results found.   Medications:   . sodium chloride    . octreotide  (SANDOSTATIN)    IV infusion 50 mcg/hr (12/03/17 0525)   . feeding supplement (PRO-STAT SUGAR FREE 64)  30 mL Oral BID  . lactulose  30 g Oral BID  . magnesium oxide  400 mg Oral Daily  . midodrine  10 mg Oral TID WC  . pantoprazole  40 mg Oral BID  . polyethylene glycol  17 g Oral BID  . rifaximin  550 mg Oral BID  . sodium chloride flush  10-40 mL Intracatheter Q12H   diphenhydrAMINE, iopamidol, ipratropium-albuterol, morphine injection, ondansetron (ZOFRAN) IV, oxyCODONE, sodium chloride flush, sodium chloride flush  Assessment/ Plan:  . AKI, no uremic symptoms.   differential includes : HRS, ATN, pigment nephropathy.   urine sodium < 10 consistent with hepatorenal  Being currently treated with midodrine and octreotide  2. Hyponatremia,  Urine sodium <10   Mild  Suggest water restriction 3. Decompensated ETOH cirrhosis, GI following. Octreotide and midodrine started. Awaiting evaluation for transplant  Has been abstinent for about 5 years  4. Massive hematoma  of L thigh req sig pRBC and FFP transfusions  hemoglobin now 9.5  5. Edema in lower extremities start lasix 40 mg IV  Cautious diuresis  I anticipate that creatinine will rise        LOS: 12 Desiray [place] W @TODAY @12 :53 PM

## 2017-12-03 NOTE — Progress Notes (Signed)
PROGRESS NOTE    Logan Patrick  XBM:[id number] DOB: [birth year] DOA: 11/21/2017 PCP: Logan Sacramento, MD      Brief Narrative:  Logan Patrick is a 67 y.o. M with hx of alcoholic cirrhosis, abstinent since 2014 and HTN who presented with fall, developed syncope in ER, found to have left thigh hematoma from the fall causing hypotension/hemorrhagic shock.    Initially admitted for hemorrhagic shock: -Given 8 units FFP to correct coagulopathy and 3 units PRBCs initially -Given additional 8 units FFP in first few days to try to temporarily correct INR, slow bleeding, as well as an additional 5 units of blood until Hgb stabilized by hospital day 4  -Orthopedic surgery were consulted, recommended conservative management, compression hose -After stabilization of his hematoma, it became clear that patient's liver cirrhosis was decompensated, MELD 35 -Furthermore, during that time, he began to develop new renal failure -Referral to transplant center was recommended and initiated -Nephrology were consulted   Assessment & Plan:  Left thigh hematoma Hemorrhagic shock Acute blood loss anemia Mild trauma preceding the hematoma (was at work, lifting a pallet or box, dizzy and fell, landed on left thigh).  Developed hematoma due to coagulopathy.  Due to underlying liver failure it will not reverse for any appreciable time with medical treatments or transfusion.  Leg again better today, swelling improving.  No new fever off antibiotics.  Hgb stable. -Appreciate orthopedics assistance -TED hose when able -Daily CBC  Acute kidney injury creatinin still trending up FeNA 0.3%.  UNa low.  Urine sediment bland.  Urine output less yesterday off Lasix.  He is quite fluid overloaded. -Consult nephrology, appreciate cares -Continue albumin, midodrine, octreotide -Defer diuresis to nephrology -Strict I/Os, daily weights  Alcoholic cirrhosis Thrombocytopenia Patient diagnosed with cirrhosis in 2014, quit  drinking at that time per Logan Patrick, abstinent since.  Was well compensated until this spring, had used a cold therapy OTC, developed jaundice, and at that time, consideration of transplant was raised, although no formal referral has yet been made.  Have discussed with Logan Patrick, [facility], we are hopeful to transfer to their hospital pending insurance approval.    No ascites, confusion. -Continue lactulose, rifaximin from home  Syncope on admission From [facility].  Hypomagnesemia -Continue magnesium -Check magnesium level tomorrow  Protein calorie malnutrition -Continue prrostat  Hyponatremia From cirrhosis. -Fluid restriction      DVT prophylaxis: SCDs Code Status: FULL Family Communication: None today MDM and disposition Plan: The below labs and imaging reports were reviewed and summarized above.  He was admitted with end-stage liver disease, severe decompensated liver failure and neuropathy resulting in hematoma with life-threatening bleeding.  His meld score is 35 which indicates roughly 50% 16-month mortality.  The case has been discussed with [facility], Logan Patrick on 5/8 as well as [facility] liver transplant Logan Patrick on 5/10 (and [facility] Med at [facility] who accepted patient formally, Logan Patrick on 5/10).    At [facility] we are awaiting insurance authorization.  At [facility] we are awaiting bed availability.      Consultants:   Gastroenterology  Nephrology   Procedures:   Echo 5/1 Study Conclusions  - Left ventricle: The cavity size was normal. There was mild   concentric hypertrophy. Systolic function was vigorous. The   estimated ejection fraction was in the range of 65% to 70%. Wall   motion was normal; there were no regional wall motion   abnormalities. - Aortic valve: There was very mild stenosis.  There was no   regurgitation. Peak velocity (S): 251 cm/s. Mean gradient (S): 12   mm Hg. Valve area (VTI): 2.59 cm^2. Valve area (Vmax): 2.66 cm^2.   Valve area  (Vmean): 2.88 cm^2. - Mitral valve: Transvalvular velocity was within the normal range.   There was no evidence for stenosis. There was mild regurgitation. - Left atrium: The atrium was moderately dilated. - Right ventricle: The cavity size was normal. Wall thickness was   normal. Systolic function was normal. - Atrial septum: No defect or patent foramen ovale was identified. - Pulmonary arteries: Systolic pressure was within the normal   range. PA peak pressure: 28 mm Hg (S).    Carotid US 5/1 Technically difficult due to severe respiratory interference  Final Interpretation: Technically difficult due to above mention limitations. Right Carotid: Velocities in the right ICA are consistent with a 1-39% stenosis.  Left Carotid: Velocities in the left ICA are consistent with a 1-39% stenosis.  Vertebrals: Bilateral vertebral arteries demonstrate antegrade flow. Subclavians: Normal flow hemodynamics were seen in bilateral subclavian       arteries.  *See table(s) above for measurements and observations.    LE doppler US Very large left thigh hematoma.  Left Technical Findings: Difficult to compress left femoral vein due to large thigh hematoma. Femoral vein appears patent with color flow Doppler.  Final Interpretation: Right: There is no evidence of deep vein thrombosis in the lower extremity. No cystic structure found in the popliteal fossa. Left: There is no evidence of a obvious deep vein thrombosis in the lower extremity . A very large hematoma in the left thigh  *See table(s) above for measurements and observations.  Electronically signed by Logan Patrick on 11/30/2017 at 6:00:30 PM.     Antimicrobials:   Vancomycin 5/6 >> 5/8    Subjective: The leg is still quite swollen, although the pain is improved he is able to walk with it.  He has had no new fever, abdominal pain, abdominal swelilng.  He has no ascites, confusion.  He has some new nausea today,  no vomiting.   Objective: Vitals:   12/02/17 2142 12/03/17 0522 12/03/17 0651 12/03/17 1103  BP: 110/63  (!) 107/54 113/62  Pulse: 91  94 88  Resp:    20  Temp: 97.9 F (36.6 C)  97.9 F (36.6 C) 97.8 F (36.6 C)  TempSrc: Oral  Oral Oral  SpO2: 95%  93% 92%  Weight:  128.1 kg (282 lb 6.4 oz)    Height:        Intake/Output Summary (Last 24 hours) at 12/03/2017 1526 Last data filed at 12/03/2017 1200 Gross per 24 hour  Intake 1324.58 ml  Output 900 ml  Net 424.58 ml   Filed Weights   12/01/17 0637 12/02/17 0500 12/03/17 0522  Weight: 129.5 kg (285 lb 6.4 oz) 130.4 kg (287 lb 7.7 oz) 128.1 kg (282 lb 6.4 oz)    Examination: General appearance: Jaundiced male, lying in bed, tired mostly sleeping.  No acute distress, easily arousable. HEENT: Sclera icteric, lids and lashes normal.  Oropharynx is dry, no oral lesions, no nasal deformity, discharge, or epistaxis.    Skin: Diffuse bruising on arms and legs, left thigh hematoma has a lot of ecchymosis around it, still quite swollen, some skin breakdown on it is covered.  Jaundice. Cardiac: Regular rate and rhythm, no murmurs.  Extensive edema to the thighs dependently.   Respiratory: Respiratory effort is normal, lungs clear without wheezing or rales.  Abdomen: Abdomen is soft and nontender without ascites.  He has no abdominal scars. MSK: Left thigh swelling is stable. Neuro: Is oriented to person, place, and time.  His responses are somewhat slowed.  His extraocular movements are intact.  Cranial nerves are normal.  Moves all extremities left leg which is limited by pain.  Speech fluent. Psych: Tourniquet is intact and he is responsive to questions.  His attention is good.  His judgment and insight appear normal.    Data Reviewed: I have personally reviewed following labs and imaging studies:  CBC: Recent Labs  Lab 11/30/17 0511 11/30/17 1955 12/01/17 0559 12/02/17 0600 12/03/17 0508  WBC 15.4* 15.5* 14.6* 12.3* 10.7*    NEUTROABS  --  12.3*  --   --   --   HGB 7.7* 9.7* 9.1* 9.5* 8.8*  HCT 23.0* 27.6* 27.0* 28.1* 26.1*  MCV 94.7 92.3 92.2 93.4 92.9  PLT 92* 96* 100* 114* 735*   Basic Metabolic Panel: Recent Labs  Lab 11/29/17 0526 11/30/17 0511 12/01/17 0559 12/02/17 0600 12/03/17 0508  NA 131* 130* 131* 134* 134*  K 3.5 3.7 3.4* 3.4* 3.5  CL 94* 93* 93* 96* 97*  CO2 25 26 27 26 26   GLUCOSE 100* 84 109* 93 128*  BUN 38* 54* 61* 68* 69*  CREATININE 1.44* 2.06* 2.36* 2.59* 2.78*  CALCIUM 8.2* 8.0* 8.4* 8.8* 9.1  MG  --  1.0*  --   --   --    GFR: Estimated Creatinine Clearance: 35.4 mL/min (A) (by C-G formula based on SCr of 2.78 mg/dL (H)). Liver Function Tests: Recent Labs  Lab 11/29/17 0526 11/29/17 0743 11/30/17 0511 12/01/17 0559 12/02/17 0600 12/03/17 0508  AST 58*  --  74* 76* 95* 89*  ALT 27  --  30 34 38 32  ALKPHOS 84  --  86 90 112 100  BILITOT 18.0* 17.8* 18.4* 21.6* 22.9* 26.9*  PROT 5.4*  --  4.9* 5.2* 5.8* 6.0*  ALBUMIN 2.2*  --  2.1* 2.0* 2.1* 2.7*   No results for input(s): LIPASE, AMYLASE in the last 168 hours. No results for input(s): AMMONIA in the last 168 hours. Coagulation Profile: Recent Labs  Lab 11/27/17 0437 11/28/17 0507 11/29/17 0526 11/30/17 0511 12/01/17 0559  INR 2.16 1.99 2.30 2.33 2.29   Cardiac Enzymes: Recent Labs  Lab 12/01/17 1643  CKTOTAL 87   BNP (last 3 results) No results for input(s): PROBNP in the last 8760 hours. HbA1C: No results for input(s): HGBA1C in the last 72 hours. CBG: No results for input(s): GLUCAP in the last 168 hours. Lipid Profile: No results for input(s): CHOL, HDL, LDLCALC, TRIG, CHOLHDL, LDLDIRECT in the last 72 hours. Thyroid Function Tests: No results for input(s): TSH, T4TOTAL, FREET4, T3FREE, THYROIDAB in the last 72 hours. Anemia Panel: No results for input(s): VITAMINB12, FOLATE, FERRITIN, TIBC, IRON, RETICCTPCT in the last 72 hours. Urine analysis:    Component Value Date/Time   COLORURINE  AMBER (A) 12/01/2017 2208   APPEARANCEUR CLEAR 12/01/2017 2208   LABSPEC 1.010 12/01/2017 2208   PHURINE 6.0 12/01/2017 2208   GLUCOSEU NEGATIVE 12/01/2017 2208   HGBUR MODERATE (A) 12/01/2017 2208   BILIRUBINUR NEGATIVE 12/01/2017 2208   KETONESUR NEGATIVE 12/01/2017 2208   PROTEINUR NEGATIVE 12/01/2017 2208   UROBILINOGEN 1.0 11/30/2012 2041   NITRITE NEGATIVE 12/01/2017 2208   LEUKOCYTESUR SMALL (A) 12/01/2017 2208   Sepsis Labs: @LABRCNTIP (procalcitonin:4,lacticacidven:4)  ) No results found for this or any previous visit (from the past  240 hour(s)).       Radiology Studies: No results found.      Scheduled Meds: . feeding supplement (Patrick-STAT SUGAR FREE 64)  30 mL Oral BID  . furosemide  40 mg Intravenous Daily  . lactulose  30 g Oral BID  . magnesium oxide  400 mg Oral Daily  . midodrine  10 mg Oral TID WC  . pantoprazole  40 mg Oral BID  . polyethylene glycol  17 g Oral BID  . rifaximin  550 mg Oral BID  . sodium chloride flush  10-40 mL Intracatheter Q12H   Continuous Infusions: . sodium chloride    . albumin human    . octreotide  (SANDOSTATIN)    IV infusion 50 mcg/hr (12/03/17 1200)     LOS: 12 days    Time spent: 25 minutes   Edwin Dada, MD [facility] 12/03/2017, 10:45 AM     Pager [phone] --- please page though AMION:  [email or web address] If 7PM-7AM, please contact night-coverage

## 2017-12-03 NOTE — Progress Notes (Signed)
Subjective: Patient seen and examined chart reviewed. Patient complains of nausea and generalized malaise and fatigue today. 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 Apparently he was able to walk around the hall several times yesterday but today has no desire to get up and move. He denies having any abdominal pain melena or hematochezia.   Objective: Vital signs in last 24 hours: Temp:  [97.9 F (36.6 C)-98.9 F (37.2 C)] 97.9 F (36.6 C) (05/11 0651) Pulse Rate:  [89-94] 94 (05/11 0651) Resp:  [18-20] 18 (05/10 1630) BP: (107-116)/(54-72) 107/54 (05/11 0651) SpO2:  [93 %-95 %] 93 % (05/11 0651) Weight:  [128.1 kg (282 lb 6.4 oz)] 128.1 kg (282 lb 6.4 oz) (05/11 0522) Last BM Date: 12/02/17  Intake/Output from previous day: 05/10 0701 - 05/11 0700 In: 1344.6 [P.O.:820; I.V.:324.6; IV Piggyback:200] Out: 900 [Urine:900] Intake/Output this shift: No intake/output data recorded.  General appearance: alert, cooperative, severely jaundiced, morbidly obese and slowed mentation Resp: clear to auscultation bilaterally Cardio: regular rate and rhythm, S1, S2 normal, no murmur, click, rub or gallop GI: soft, non-tender; bowel sounds normal; no masses,  no organomegaly Extremities: edema 3+ piiting edema upto the thighs  Lab Results: Recent Labs    12/01/17 0559 12/02/17 0600 12/03/17 0508  WBC 14.6* 12.3* 10.7*  HGB 9.1* 9.5* 8.8*  HCT 27.0* 28.1* 26.1*  PLT 100* 114* 108*   BMET Recent Labs    12/01/17 0559 12/02/17 0600 12/03/17 0508  NA 131* 134* 134*  K 3.4* 3.4* 3.5  CL 93* 96* 97*  CO2 27 26 26   GLUCOSE 109* 93 128*  BUN 61* 68* 69*  CREATININE 2.36* 2.59* 2.78*  CALCIUM 8.4* 8.8* 9.1   LFT Recent Labs    12/03/17 0508  PROT 6.0*  ALBUMIN 2.7*  AST 89*  ALT 32  ALKPHOS 100  BILITOT 26.9*   PT/INR Recent Labs    12/01/17 0559  LABPROT 25.0*  INR 2.29   Medications: I have reviewed the patient's current medications.  Assessment/Plan: 1)  Decompensated alcoholic cirrhosis with a INR 2.29 and a Bilirubin of 26.9 on octreotide and Midodrine awaiting transfer to [facility] for possible OLT.  2) Acute kidney injury/hyponatremia;  Urine sodium less than 10; Creatinine up to 2.78 today.  3) Massive hematoma on left thigh s/p fall.  LOS: 12 days   Onur Mori 12/03/2017, 9:30 AM

## 2017-12-03 NOTE — Progress Notes (Signed)
Thigh high Ted hose on. Patient tolerated well.

## 2017-12-04 LAB — COMPREHENSIVE METABOLIC PANEL
ALT: 29 U/L (ref 17–63)
AST: 67 U/L — ABNORMAL HIGH (ref 15–41)
Albumin: 2.6 g/dL — ABNORMAL LOW (ref 3.5–5.0)
Alkaline Phosphatase: 83 U/L (ref 38–126)
Anion gap: 13 (ref 5–15)
BUN: 70 mg/dL — ABNORMAL HIGH (ref 6–20)
CHLORIDE: 97 mmol/L — AB (ref 101–111)
CO2: 26 mmol/L (ref 22–32)
Calcium: 9 mg/dL (ref 8.9–10.3)
Creatinine, Ser: 2.78 mg/dL — ABNORMAL HIGH (ref 0.61–1.24)
GFR calc non Af Amer: 22 mL/min — ABNORMAL LOW (ref >60)
GFR, EST AFRICAN AMERICAN: 26 mL/min — AB (ref >60)
Glucose, Bld: 134 mg/dL — ABNORMAL HIGH (ref 65–99)
POTASSIUM: 3.1 mmol/L — AB (ref 3.5–5.1)
Sodium: 136 mmol/L (ref 135–145)
Total Bilirubin: 26.3 mg/dL (ref 0.3–1.2)
Total Protein: 5.4 g/dL — ABNORMAL LOW (ref 6.5–8.1)

## 2017-12-04 LAB — CBC
HEMATOCRIT: 24 % — AB (ref 39.0–52.0)
Hemoglobin: 8.2 g/dL — ABNORMAL LOW (ref 13.0–17.0)
MCH: 31.7 pg (ref 26.0–34.0)
MCHC: 34.2 g/dL (ref 30.0–36.0)
MCV: 92.7 fL (ref 78.0–100.0)
PLATELETS: 117 10*3/uL — AB (ref 150–400)
RBC: 2.59 MIL/uL — AB (ref 4.22–5.81)
RDW: 20.4 % — ABNORMAL HIGH (ref 11.5–15.5)
WBC: 10.9 10*3/uL — AB (ref 4.0–10.5)

## 2017-12-04 LAB — MAGNESIUM: Magnesium: 1.6 mg/dL — ABNORMAL LOW (ref 1.7–2.4)

## 2017-12-04 MED ORDER — POTASSIUM CHLORIDE CRYS ER 20 MEQ PO TBCR
20.0000 meq | EXTENDED_RELEASE_TABLET | Freq: Once | ORAL | Status: AC
  Administered 2017-12-04: 20 meq via ORAL
  Filled 2017-12-04: qty 1

## 2017-12-04 NOTE — Progress Notes (Signed)
PROGRESS NOTE    Logan Patrick  KKX:[id number] DOB: 1950-11-18 DOA: 11/21/2017 PCP: [name], MD      Brief Narrative:  Logan Patrick is a 67 y.o. M with hx of alcoholic cirrhosis, abstinent since 2014 and HTN who presented with fall, developed syncope in ER, found to have left thigh hematoma from the fall causing hypotension/hemorrhagic shock.    Initially admitted for hemorrhagic shock: -Given 8 units FFP to correct coagulopathy and 3 units PRBCs initially -Given additional 8 units FFP in first few days to try to temporarily correct INR, slow bleeding, as well as an additional 5 units of blood until Hgb stabilized by hospital day 4  -Orthopedic surgery were consulted, recommended conservative management, compression hose -After stabilization of his hematoma, it became clear that patient's liver cirrhosis was decompensated, MELD 35 -Furthermore, during that time, he began to develop new renal failure -Referral to transplant center was recommended and initiated -Nephrology were consulted   Assessment & Plan:  Left thigh hematoma Hemorrhagic shock Acute blood loss anemia Mild trauma preceding the hematoma (was at work, lifting a pallet or box, dizzy and fell, landed on left thigh).  Developed hematoma due to coagulopathy.  Due to underlying liver failure it will not reverse for any appreciable time with medical treatments or transfusion.  There was brief concern about infection, but he has never had fever. Leg pain fine, able to walk today.  No change in swelling.  Orthopedics asked [place] to stop the ACE wrap. -TED hose when able -CBC daily  Acute kidney injury Differential includes hepatorenal syndrome type 2, ATN, contrast nephropathy, congestive nephropathy, pigment nephropathy.  Treating HRS empirically. FeNA 0.3%.  UNa low.  Urine sediment bland.  Still extremely edematous.  Lasix yesterday and Cr stable today. -Consult nephrology, appreciate cares -Continue albumin,  midodrine, octreotide gtt -Defer diuresis to nephrology -Strict I/Os, daily weights  Alcoholic cirrhosis Thrombocytopenia Patient diagnosed with cirrhosis in 2014, quit drinking at that time per Dr. [name], abstinent since.  Was well compensated until this spring, had used a cold therapy OTC, developed jaundice, and at that time, consideration of transplant was raised, although no formal referral has yet been made.  Have discussed with Dr. [name], [facility], we are hopeful to transfer to their hospital pending insurance approval.    No new ascites or confusion. -Continue lactulose, rifaximin (was on these at home)  Syncope on admission From [facility].  Hypomagnesemia Mag level improving, still low -Continue mag  Protein calorie malnutrition -Continue prrostat  Hyponatremia From cirrhosis. -Fluid restriction in place  Hypokalemia -Cautious supplementation ordered    DVT prophylaxis: SCDs Code Status: FULL Family Communication: None today MDM and disposition Plan: The below labs and imaging reports were reviewed and summarized above.  He was admitted with end-stage liver disease, severe decompensated liver failure and neuropathy resulting in hematoma with life-threatening bleeding.  His meld score is 35 which indicates roughly 50% 28-month mortality.  The case has been discussed with [facility] transplant, Dr. [name] on 5/8 as well as [facility] liver transplant Dr. [name] on 5/10 (and [facility] at [facility] who accepted patient formally, Dr. [name] on 5/10).    At [facility] we are awaiting insurance authorization.  At [facility] we are awaiting bed availability.      Consultants:   Gastroenterology  Nephrology   Procedures:   Echo 5/1 Study Conclusions  - Left ventricle: The cavity size was normal. There was mild   concentric hypertrophy. Systolic function was  vigorous. The   estimated ejection fraction was in the range of 65% to 70%. Wall   motion was normal; there were no regional  wall motion   abnormalities. - Aortic valve: There was very mild stenosis. There was no   regurgitation. Peak velocity (S): 251 cm/s. Mean gradient (S): 12   mm Hg. Valve area (VTI): 2.59 cm^2. Valve area (Vmax): 2.66 cm^2.   Valve area (Vmean): 2.88 cm^2. - Mitral valve: Transvalvular velocity was within the normal range.   There was no evidence for stenosis. There was mild regurgitation. - Left atrium: The atrium was moderately dilated. - Right ventricle: The cavity size was normal. Wall thickness was   normal. Systolic function was normal. - Atrial septum: No defect or patent foramen ovale was identified. - Pulmonary arteries: Systolic pressure was within the normal   range. PA peak pressure: 28 mm Hg (S).    Carotid US 5/1 Technically difficult due to severe respiratory interference  Final Interpretation: Technically difficult due to above mention limitations. Right Carotid: Velocities in the right ICA are consistent with a 1-39% stenosis.  Left Carotid: Velocities in the left ICA are consistent with a 1-39% stenosis.  Vertebrals: Bilateral vertebral arteries demonstrate antegrade flow. Subclavians: Normal flow hemodynamics were seen in bilateral subclavian       arteries.  *See table(s) above for measurements and observations.    LE doppler US Very large left thigh hematoma.  Left Technical Findings: Difficult to compress left femoral vein due to large thigh hematoma. Femoral vein appears patent with color flow Doppler.  Final Interpretation: Right: There is no evidence of deep vein thrombosis in the lower extremity. No cystic structure found in the popliteal fossa. Left: There is no evidence of a obvious deep vein thrombosis in the lower extremity . A very large hematoma in the left thigh  *See table(s) above for measurements and observations.  Electronically signed by Ruta Hinds on 11/30/2017 at 6:00:30 PM.     Antimicrobials:   Vancomycin 5/6  >> 5/8    Subjective: Pain improved, swelling unchanged.  Able to walk some yesterday afternoon and sit in a chair.  No new fever, abdominal pain, swelling.  No confusion, ascites.  Nausea persists today, no vomiting.  Moved his bowels.      Objective: Vitals:   12/03/17 0651 12/03/17 1103 12/03/17 2131 12/04/17 0620  BP: (!) 107/54 113/62 (!) 108/56 109/60  Pulse: 94 88 80 82  Resp:  20 18 18   Temp: 97.9 F (36.6 C) 97.8 F (36.6 C) (!) 97.5 F (36.4 C) 97.7 F (36.5 C)  TempSrc: Oral Oral Oral Oral  SpO2: 93% 92% 94% 95%  Weight:    127 kg (280 lb)  Height:        Intake/Output Summary (Last 24 hours) at 12/04/2017 1455 Last data filed at 12/04/2017 0950 Gross per 24 hour  Intake 1415 ml  Output 600 ml  Net 815 ml   Filed Weights   12/02/17 0500 12/03/17 0522 12/04/17 0620  Weight: 130.4 kg (287 lb 7.7 oz) 128.1 kg (282 lb 6.4 oz) 127 kg (280 lb)    Examination: General appearance: Obese male, lying in bed, appears listless, jaundiced, no acute distress, easily arousable, interactive. HEENT: Obvious icterus, lids and lashes normal, oropharynx dry, no oral lesions.  No deformity of the nose, nasal discharge.    Skin: Jaundice unchanged, ecchymosis over left thigh sensitive, no change from yesterday. Cardiac: RRR, no murmurs, extensive edema to the thighs, also  in the arms, anasarca.  JVP not visible. Respiratory: Normal respiratory rate, lungs clear without rales or wheezing    Abdomen: Abdomen soft and nontender, no ascites, no abdominal scars, no hernias. MSK: Left thigh swelling, fairly tense, painful to palpation. Neuro: Oriented to person, place, time, responses somewhat slowed, no asterixis, extraocular movements intact, cranial nerves normal, moves all extremities except the left leg as a result of pain, speech fluent. Psych: Sensorium intact and responsive to questions.  Attention good, judgment and insight normal    Data Reviewed: I have personally reviewed  following labs and imaging studies:  CBC: Recent Labs  Lab 11/30/17 1955 12/01/17 0559 12/02/17 0600 12/03/17 0508 12/04/17 0330  WBC 15.5* 14.6* 12.3* 10.7* 10.9*  NEUTROABS 12.3*  --   --   --   --   HGB 9.7* 9.1* 9.5* 8.8* 8.2*  HCT 27.6* 27.0* 28.1* 26.1* 24.0*  MCV 92.3 92.2 93.4 92.9 92.7  PLT 96* 100* 114* 108* 269*   Basic Metabolic Panel: Recent Labs  Lab 11/30/17 0511 12/01/17 0559 12/02/17 0600 12/03/17 0508 12/04/17 0330  NA 130* 131* 134* 134* 136  K 3.7 3.4* 3.4* 3.5 3.1*  CL 93* 93* 96* 97* 97*  CO2 26 27 26 26 26   GLUCOSE 84 109* 93 128* 134*  BUN 54* 61* 68* 69* 70*  CREATININE 2.06* 2.36* 2.59* 2.78* 2.78*  CALCIUM 8.0* 8.4* 8.8* 9.1 9.0  MG 1.0*  --   --   --  1.6*   GFR: Estimated Creatinine Clearance: 35.2 mL/min (A) (by C-G formula based on SCr of 2.78 mg/dL (H)). Liver Function Tests: Recent Labs  Lab 11/30/17 0511 12/01/17 0559 12/02/17 0600 12/03/17 0508 12/04/17 0330  AST 74* 76* 95* 89* 67*  ALT 30 34 38 32 29  ALKPHOS 86 90 112 100 83  BILITOT 18.4* 21.6* 22.9* 26.9* 26.3*  PROT 4.9* 5.2* 5.8* 6.0* 5.4*  ALBUMIN 2.1* 2.0* 2.1* 2.7* 2.6*   No results for input(s): LIPASE, AMYLASE in the last 168 hours. No results for input(s): AMMONIA in the last 168 hours. Coagulation Profile: Recent Labs  Lab 11/28/17 0507 11/29/17 0526 11/30/17 0511 12/01/17 0559  INR 1.99 2.30 2.33 2.29   Cardiac Enzymes: Recent Labs  Lab 12/01/17 1643  CKTOTAL 87   BNP (last 3 results) No results for input(s): PROBNP in the last 8760 hours. HbA1C: No results for input(s): HGBA1C in the last 72 hours. CBG: No results for input(s): GLUCAP in the last 168 hours. Lipid Profile: No results for input(s): CHOL, HDL, LDLCALC, TRIG, CHOLHDL, LDLDIRECT in the last 72 hours. Thyroid Function Tests: No results for input(s): TSH, T4TOTAL, FREET4, T3FREE, THYROIDAB in the last 72 hours. Anemia Panel: No results for input(s): VITAMINB12, FOLATE,  FERRITIN, TIBC, IRON, RETICCTPCT in the last 72 hours. Urine analysis:    Component Value Date/Time   COLORURINE AMBER (A) 12/01/2017 2208   APPEARANCEUR CLEAR 12/01/2017 2208   LABSPEC 1.010 12/01/2017 2208   PHURINE 6.0 12/01/2017 2208   GLUCOSEU NEGATIVE 12/01/2017 2208   HGBUR MODERATE (A) 12/01/2017 2208   BILIRUBINUR NEGATIVE 12/01/2017 2208   KETONESUR NEGATIVE 12/01/2017 2208   PROTEINUR NEGATIVE 12/01/2017 2208   UROBILINOGEN 1.0 11/30/2012 2041   NITRITE NEGATIVE 12/01/2017 2208   LEUKOCYTESUR SMALL (A) 12/01/2017 2208   Sepsis Labs: @LABRCNTIP (procalcitonin:4,lacticacidven:4)  ) No results found for this or any previous visit (from the past 240 hour(s)).       Radiology Studies: No results found.  Scheduled Meds: . feeding supplement (PRO-STAT SUGAR FREE 64)  30 mL Oral BID  . furosemide  40 mg Intravenous Daily  . lactulose  30 g Oral BID  . magnesium oxide  400 mg Oral Daily  . midodrine  10 mg Oral TID WC  . pantoprazole  40 mg Oral BID  . polyethylene glycol  17 g Oral BID  . potassium chloride  20 mEq Oral Once  . rifaximin  550 mg Oral BID  . sodium chloride flush  10-40 mL Intracatheter Q12H   Continuous Infusions: . sodium chloride    . albumin human    . octreotide  (SANDOSTATIN)    IV infusion 50 mcg/hr (12/04/17 0540)     LOS: 13 days    Time spent: 25 minutes   Edwin Dada, MD [facility] 12/04/2017, 8:45 AM     Pager [phone] --- please page though AMION:  [email or web address] If 7PM-7AM, please contact night-coverage   [phone]

## 2017-12-04 NOTE — Progress Notes (Signed)
[facility] ROUNDING NOTE   Subjective:   Interval History:   Awake and alert today  No distress  Still some nausea   Brief history 67 y.o. M with hx of alcoholic cirrhosis, abstinent since 2014 and HTN decompensated cirrhosis with coagulopathy, rising total bilirubin (partially related to resolution of hematoma), and renal failure. He was admitted after falling and was found to be in hemorrhagic shock from a large thigh hematoma He is nonoliguric and is being currently treated as though he has hepatorenal syndrome type 2 with midodrine and octreotide     Objective:  Vital signs in last 24 hours:  Temp:  [97.5 F (36.4 C)-97.8 F (36.6 C)] 97.7 F (36.5 C) (05/12 0620) Pulse Rate:  [80-88] 82 (05/12 0620) Resp:  [18-20] 18 (05/12 0620) BP: (108-113)/(56-62) 109/60 (05/12 0620) SpO2:  [92 %-95 %] 95 % (05/12 0620) Weight:  [280 lb (127 kg)] 280 lb (127 kg) (05/12 0620)  Weight change: -2 lb 6.4 oz (-1.089 kg) Filed Weights   12/02/17 0500 12/03/17 0522 12/04/17 0620  Weight: 287 lb 7.7 oz (130.4 kg) 282 lb 6.4 oz (128.1 kg) 280 lb (127 kg)    Intake/Output: I/O last 3 completed shifts: In: 2540 [P.O.:1440; I.V.:900; IV Piggyback:200] Out: 1000 [Urine:1000]   Intake/Output this shift:  Total I/O In: 120 [P.O.:120] Out: -   Alert and oriented  Deeply jaundiced  CVS- RRR no murmurs  RS- CTA no rales  ABD- ascites  EXT-  Diffuse edema  Has large hematoma on left thigh   Basic Metabolic Panel: Recent Labs  Lab 11/30/17 0511 12/01/17 0559 12/02/17 0600 12/03/17 0508 12/04/17 0330  NA 130* 131* 134* 134* 136  K 3.7 3.4* 3.4* 3.5 3.1*  CL 93* 93* 96* 97* 97*  CO2 26 27 26 26 26   GLUCOSE 84 109* 93 128* 134*  BUN 54* 61* 68* 69* 70*  CREATININE 2.06* 2.36* 2.59* 2.78* 2.78*  CALCIUM 8.0* 8.4* 8.8* 9.1 9.0  MG 1.0*  --   --   --  1.6*    Liver Function Tests: Recent Labs  Lab 11/30/17 0511 12/01/17 0559 12/02/17 0600 12/03/17 0508  12/04/17 0330  AST 74* 76* 95* 89* 67*  ALT 30 34 38 32 29  ALKPHOS 86 90 112 100 83  BILITOT 18.4* 21.6* 22.9* 26.9* 26.3*  PROT 4.9* 5.2* 5.8* 6.0* 5.4*  ALBUMIN 2.1* 2.0* 2.1* 2.7* 2.6*   No results for input(s): LIPASE, AMYLASE in the last 168 hours. No results for input(s): AMMONIA in the last 168 hours.  CBC: Recent Labs  Lab 11/30/17 1955 12/01/17 0559 12/02/17 0600 12/03/17 0508 12/04/17 0330  WBC 15.5* 14.6* 12.3* 10.7* 10.9*  NEUTROABS 12.3*  --   --   --   --   HGB 9.7* 9.1* 9.5* 8.8* 8.2*  HCT 27.6* 27.0* 28.1* 26.1* 24.0*  MCV 92.3 92.2 93.4 92.9 92.7  PLT 96* 100* 114* 108* 117*    Cardiac Enzymes: Recent Labs  Lab 12/01/17 1643  CKTOTAL 87    BNP: Invalid input(s): POCBNP  CBG: No results for input(s): GLUCAP in the last 168 hours.  Microbiology: Results for orders placed or performed during the hospital encounter of 11/21/17  Culture, blood (routine x 2)     Status: None   Collection Time: 11/21/17  3:20 PM  Result Value Ref Range Status   Specimen Description   Final    BLOOD LEFT ANTECUBITAL Performed at [facility], [address]  [place], [place] [address]    Special Requests   Final    BOTTLES DRAWN AEROBIC AND ANAEROBIC Blood Culture adequate volume Performed at [facility], [address] [place], [place] [address]    Culture   Final    NO GROWTH 5 DAYS Performed at [facility], [place] [address], [place], [place] [address]    Report Status 11/26/2017 FINAL  Final  Culture, blood (routine x 2)     Status: None   Collection Time: 11/21/17  3:40 PM  Result Value Ref Range Status   Specimen Description   Final    BLOOD LEFT HAND Performed at [facility], [address] [place], [place] [address]    Special Requests   Final    BOTTLES DRAWN AEROBIC AND ANAEROBIC Blood Culture adequate volume Performed at [facility], [address] [place], [place] [address]    Culture    Final    NO GROWTH 5 DAYS Performed at [facility], [place] [address] [place], [place] [address]    Report Status 11/26/2017 FINAL  Final  MRSA PCR Screening     Status: None   Collection Time: 11/22/17  3:34 PM  Result Value Ref Range Status   MRSA by PCR NEGATIVE NEGATIVE Final    Comment:        The GeneXpert MRSA Assay (FDA approved for NASAL specimens only), is one component of a comprehensive MRSA colonization surveillance program. It is not intended to diagnose MRSA infection nor to guide or monitor treatment for MRSA infections. Performed at [facility], [place] [address] [place],  [address]     Coagulation Studies: No results for input(s): LABPROT, INR in the last 72 hours.  Urinalysis: Recent Labs    12/01/17 2208  COLORURINE AMBER*  LABSPEC 1.010  PHURINE 6.0  GLUCOSEU NEGATIVE  HGBUR MODERATE*  BILIRUBINUR NEGATIVE  KETONESUR NEGATIVE  PROTEINUR NEGATIVE  NITRITE NEGATIVE  LEUKOCYTESUR SMALL*      Imaging: No results found.   Medications:   . sodium chloride    . albumin human    . octreotide  (SANDOSTATIN)    IV infusion 50 mcg/hr (12/04/17 0540)   . feeding supplement (PRO-STAT SUGAR FREE 64)  30 mL Oral BID  . furosemide  40 mg Intravenous Daily  . lactulose  30 g Oral BID  . magnesium oxide  400 mg Oral Daily  . midodrine  10 mg Oral TID WC  . pantoprazole  40 mg Oral BID  . polyethylene glycol  17 g Oral BID  . potassium chloride  20 mEq Oral Once  . rifaximin  550 mg Oral BID  . sodium chloride flush  10-40 mL Intracatheter Q12H   diphenhydrAMINE, iopamidol, ipratropium-albuterol, morphine injection, ondansetron (ZOFRAN) IV, oxyCODONE, sodium chloride flush, sodium chloride flush  Assessment/ Plan:  . AKI, no uremic symptoms.   differential includes : HRS, ATN, pigment nephropathy.   urine sodium < 10 consistent with hepatorenal  Being currently treated with midodrine and octreotide. It is complicated by the  hemorrhagic shock picture on admission  2. Hyponatremia,  Urine sodium <10   Mild  Suggest water restriction 3. Decompensated ETOH cirrhosis, GI following. Octreotide and midodrine started. Awaiting evaluation for transplant  Has been abstinent for about 5 years  4. Massive hematoma of L thigh ( hemorrhagic shock )req sig pRBC and FFP transfusions  hemoglobin now 8.2  5. Edema in lower extremities started lasix 40 mg IV  Not much diuresis 5/11 and creatinine essentially unchanged   Will continue lasix at this point - I would give this dose another day before increasing dose  6. Hypokalemia will replete with potassium supplements ( given this morning )        LOS: 13 [place] Zent W @TODAY @9 :55 AM

## 2017-12-04 NOTE — Progress Notes (Addendum)
Subjective: Since I last evaluated the patient, there has not been much change. He however feels the nausea somewhat better today. His family is at bedside. He seems more alert and awake and interactive today. He denies having any abdominal pain or vomiting. He had 3 bowel movements yesterday and 1 today with no evidence of melena hematochezia.   Objective: Vital signs in last 24 hours: Temp:  [97.5 F (36.4 C)-97.8 F (36.6 C)] 97.7 F (36.5 C) (05/12 0620) Pulse Rate:  [80-88] 82 (05/12 0620) Resp:  [18-20] 18 (05/12 0620) BP: (108-113)/(56-62) 109/60 (05/12 0620) SpO2:  [92 %-95 %] 95 % (05/12 0620) Weight:  [127 kg (280 lb)] 127 kg (280 lb) (05/12 0620) Last BM Date: 12/03/17  Intake/Output from previous day: 05/11 0701 - 05/12 0700 In: 1735 [P.O.:960; I.V.:675; IV Piggyback:100] Out: 600 [Urine:600] Intake/Output this shift: Total I/O In: 120 [P.O.:120] Out: -   GPE: General appearance: alert, cooperative, appears older than stated age, fatigued, icteric, no distress and morbidly obese Chest: CTA S1 & S2 regular. Abdomen is soft NTNABS Extremities; Large hematoma on left thigh and 2+ edema bilterally  Lab Results: Recent Labs    12/02/17 0600 12/03/17 0508 12/04/17 0330  WBC 12.3* 10.7* 10.9*  HGB 9.5* 8.8* 8.2*  HCT 28.1* 26.1* 24.0*  PLT 114* 108* 117*   BMET Recent Labs    12/02/17 0600 12/03/17 0508 12/04/17 0330  NA 134* 134* 136  K 3.4* 3.5 3.1*  CL 96* 97* 97*  CO2 26 26 26   GLUCOSE 93 128* 134*  BUN 68* 69* 70*  CREATININE 2.59* 2.78* 2.78*  CALCIUM 8.8* 9.1 9.0   LFT Recent Labs    12/04/17 0330  PROT 5.4*  ALBUMIN 2.6*  AST 67*  ALT 29  ALKPHOS 83  BILITOT 26.3*   Studies/Results: No results found.  Medications: I have reviewed the patient's current medications.  Assessment/Plan: 1) Decompensated alcoholic cirrhosis with acute kidney injury-urine sodium is less than 10. ? Hepatorenal syndrome this picture is complicated by  hemorrhagic shock on admission. Awaiting transfer to [facility] for possible OLT. 2)  Mild hyponatremia and hypokalemia-potassium is being repleted. Water restriction is suggested.  3) Massive hematoma of left thigh requiring blood transfusions and FFP.  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  LOS: 13 days   Erabella Kuipers 12/04/2017, 9:52 AM

## 2017-12-04 NOTE — Progress Notes (Signed)
Morning medications delayed due to patient having nausea and requesting meds after lunch.   Nausea has resolved. All morning meds have now been given after lunch.

## 2017-12-05 DIAGNOSIS — R9431 Abnormal electrocardiogram [ECG] [EKG]: Secondary | ICD-10-CM | POA: Diagnosis not present

## 2017-12-05 DIAGNOSIS — J90 Pleural effusion, not elsewhere classified: Secondary | ICD-10-CM | POA: Diagnosis not present

## 2017-12-05 DIAGNOSIS — Z944 Liver transplant status: Secondary | ICD-10-CM | POA: Diagnosis not present

## 2017-12-05 DIAGNOSIS — F1011 Alcohol abuse, in remission: Secondary | ICD-10-CM | POA: Diagnosis not present

## 2017-12-05 DIAGNOSIS — K8020 Calculus of gallbladder without cholecystitis without obstruction: Secondary | ICD-10-CM | POA: Diagnosis not present

## 2017-12-05 DIAGNOSIS — I96 Gangrene, not elsewhere classified: Secondary | ICD-10-CM | POA: Diagnosis not present

## 2017-12-05 DIAGNOSIS — E8770 Fluid overload, unspecified: Secondary | ICD-10-CM | POA: Diagnosis not present

## 2017-12-05 DIAGNOSIS — E46 Unspecified protein-calorie malnutrition: Secondary | ICD-10-CM | POA: Diagnosis not present

## 2017-12-05 DIAGNOSIS — I951 Orthostatic hypotension: Secondary | ICD-10-CM | POA: Diagnosis not present

## 2017-12-05 DIAGNOSIS — D62 Acute posthemorrhagic anemia: Secondary | ICD-10-CM | POA: Diagnosis not present

## 2017-12-05 DIAGNOSIS — S8010XA Contusion of unspecified lower leg, initial encounter: Secondary | ICD-10-CM | POA: Diagnosis not present

## 2017-12-05 DIAGNOSIS — D899 Disorder involving the immune mechanism, unspecified: Secondary | ICD-10-CM | POA: Diagnosis not present

## 2017-12-05 DIAGNOSIS — R1310 Dysphagia, unspecified: Secondary | ICD-10-CM | POA: Diagnosis not present

## 2017-12-05 DIAGNOSIS — K7030 Alcoholic cirrhosis of liver without ascites: Secondary | ICD-10-CM | POA: Diagnosis not present

## 2017-12-05 DIAGNOSIS — R571 Hypovolemic shock: Secondary | ICD-10-CM | POA: Diagnosis not present

## 2017-12-05 DIAGNOSIS — N289 Disorder of kidney and ureter, unspecified: Secondary | ICD-10-CM | POA: Diagnosis not present

## 2017-12-05 DIAGNOSIS — I120 Hypertensive chronic kidney disease with stage 5 chronic kidney disease or end stage renal disease: Secondary | ICD-10-CM | POA: Diagnosis not present

## 2017-12-05 DIAGNOSIS — N170 Acute kidney failure with tubular necrosis: Secondary | ICD-10-CM | POA: Diagnosis not present

## 2017-12-05 DIAGNOSIS — N179 Acute kidney failure, unspecified: Secondary | ICD-10-CM | POA: Diagnosis not present

## 2017-12-05 DIAGNOSIS — Z79899 Other long term (current) drug therapy: Secondary | ICD-10-CM | POA: Diagnosis not present

## 2017-12-05 DIAGNOSIS — Z01818 Encounter for other preprocedural examination: Secondary | ICD-10-CM | POA: Diagnosis not present

## 2017-12-05 DIAGNOSIS — M7981 Nontraumatic hematoma of soft tissue: Secondary | ICD-10-CM | POA: Diagnosis not present

## 2017-12-05 DIAGNOSIS — K7290 Hepatic failure, unspecified without coma: Secondary | ICD-10-CM | POA: Diagnosis not present

## 2017-12-05 DIAGNOSIS — J811 Chronic pulmonary edema: Secondary | ICD-10-CM | POA: Diagnosis not present

## 2017-12-05 DIAGNOSIS — F54 Psychological and behavioral factors associated with disorders or diseases classified elsewhere: Secondary | ICD-10-CM | POA: Diagnosis not present

## 2017-12-05 DIAGNOSIS — K219 Gastro-esophageal reflux disease without esophagitis: Secondary | ICD-10-CM | POA: Diagnosis not present

## 2017-12-05 DIAGNOSIS — R17 Unspecified jaundice: Secondary | ICD-10-CM | POA: Diagnosis not present

## 2017-12-05 DIAGNOSIS — J9811 Atelectasis: Secondary | ICD-10-CM | POA: Diagnosis not present

## 2017-12-05 DIAGNOSIS — G4701 Insomnia due to medical condition: Secondary | ICD-10-CM | POA: Diagnosis not present

## 2017-12-05 DIAGNOSIS — D8989 Other specified disorders involving the immune mechanism, not elsewhere classified: Secondary | ICD-10-CM | POA: Diagnosis not present

## 2017-12-05 DIAGNOSIS — R601 Generalized edema: Secondary | ICD-10-CM | POA: Diagnosis not present

## 2017-12-05 DIAGNOSIS — R410 Disorientation, unspecified: Secondary | ICD-10-CM | POA: Diagnosis not present

## 2017-12-05 DIAGNOSIS — F1010 Alcohol abuse, uncomplicated: Secondary | ICD-10-CM | POA: Diagnosis not present

## 2017-12-05 DIAGNOSIS — Z8619 Personal history of other infectious and parasitic diseases: Secondary | ICD-10-CM | POA: Diagnosis not present

## 2017-12-05 DIAGNOSIS — I10 Essential (primary) hypertension: Secondary | ICD-10-CM | POA: Diagnosis not present

## 2017-12-05 DIAGNOSIS — R05 Cough: Secondary | ICD-10-CM | POA: Diagnosis not present

## 2017-12-05 DIAGNOSIS — K767 Hepatorenal syndrome: Secondary | ICD-10-CM | POA: Diagnosis not present

## 2017-12-05 DIAGNOSIS — Z008 Encounter for other general examination: Secondary | ICD-10-CM | POA: Diagnosis not present

## 2017-12-05 DIAGNOSIS — E1165 Type 2 diabetes mellitus with hyperglycemia: Secondary | ICD-10-CM | POA: Diagnosis not present

## 2017-12-05 DIAGNOSIS — F05 Delirium due to known physiological condition: Secondary | ICD-10-CM | POA: Diagnosis not present

## 2017-12-05 DIAGNOSIS — R269 Unspecified abnormalities of gait and mobility: Secondary | ICD-10-CM | POA: Diagnosis not present

## 2017-12-05 DIAGNOSIS — Z7682 Awaiting organ transplant status: Secondary | ICD-10-CM | POA: Diagnosis not present

## 2017-12-05 DIAGNOSIS — K559 Vascular disorder of intestine, unspecified: Secondary | ICD-10-CM | POA: Diagnosis not present

## 2017-12-05 DIAGNOSIS — D689 Coagulation defect, unspecified: Secondary | ICD-10-CM | POA: Diagnosis not present

## 2017-12-05 DIAGNOSIS — E1122 Type 2 diabetes mellitus with diabetic chronic kidney disease: Secondary | ICD-10-CM | POA: Diagnosis not present

## 2017-12-05 DIAGNOSIS — Z792 Long term (current) use of antibiotics: Secondary | ICD-10-CM | POA: Diagnosis not present

## 2017-12-05 DIAGNOSIS — I472 Ventricular tachycardia: Secondary | ICD-10-CM | POA: Diagnosis not present

## 2017-12-05 DIAGNOSIS — K922 Gastrointestinal hemorrhage, unspecified: Secondary | ICD-10-CM | POA: Diagnosis not present

## 2017-12-05 DIAGNOSIS — E872 Acidosis: Secondary | ICD-10-CM | POA: Diagnosis not present

## 2017-12-05 DIAGNOSIS — Z789 Other specified health status: Secondary | ICD-10-CM | POA: Diagnosis not present

## 2017-12-05 DIAGNOSIS — T380X5A Adverse effect of glucocorticoids and synthetic analogues, initial encounter: Secondary | ICD-10-CM | POA: Diagnosis not present

## 2017-12-05 DIAGNOSIS — K766 Portal hypertension: Secondary | ICD-10-CM | POA: Diagnosis not present

## 2017-12-05 DIAGNOSIS — M6281 Muscle weakness (generalized): Secondary | ICD-10-CM | POA: Diagnosis not present

## 2017-12-05 DIAGNOSIS — E1152 Type 2 diabetes mellitus with diabetic peripheral angiopathy with gangrene: Secondary | ICD-10-CM | POA: Diagnosis not present

## 2017-12-05 DIAGNOSIS — I129 Hypertensive chronic kidney disease with stage 1 through stage 4 chronic kidney disease, or unspecified chronic kidney disease: Secondary | ICD-10-CM | POA: Diagnosis not present

## 2017-12-05 DIAGNOSIS — Z794 Long term (current) use of insulin: Secondary | ICD-10-CM | POA: Diagnosis not present

## 2017-12-05 DIAGNOSIS — F1021 Alcohol dependence, in remission: Secondary | ICD-10-CM | POA: Diagnosis not present

## 2017-12-05 DIAGNOSIS — K7460 Unspecified cirrhosis of liver: Secondary | ICD-10-CM | POA: Diagnosis not present

## 2017-12-05 DIAGNOSIS — S7012XS Contusion of left thigh, sequela: Secondary | ICD-10-CM | POA: Diagnosis not present

## 2017-12-05 DIAGNOSIS — E891 Postprocedural hypoinsulinemia: Secondary | ICD-10-CM | POA: Diagnosis not present

## 2017-12-05 DIAGNOSIS — I4581 Long QT syndrome: Secondary | ICD-10-CM | POA: Diagnosis not present

## 2017-12-05 DIAGNOSIS — J95821 Acute postprocedural respiratory failure: Secondary | ICD-10-CM | POA: Diagnosis not present

## 2017-12-05 DIAGNOSIS — N186 End stage renal disease: Secondary | ICD-10-CM | POA: Diagnosis not present

## 2017-12-05 DIAGNOSIS — N189 Chronic kidney disease, unspecified: Secondary | ICD-10-CM | POA: Diagnosis not present

## 2017-12-05 DIAGNOSIS — R609 Edema, unspecified: Secondary | ICD-10-CM | POA: Diagnosis not present

## 2017-12-05 DIAGNOSIS — R739 Hyperglycemia, unspecified: Secondary | ICD-10-CM | POA: Diagnosis not present

## 2017-12-05 DIAGNOSIS — K5900 Constipation, unspecified: Secondary | ICD-10-CM | POA: Diagnosis not present

## 2017-12-05 DIAGNOSIS — G4700 Insomnia, unspecified: Secondary | ICD-10-CM | POA: Diagnosis not present

## 2017-12-05 DIAGNOSIS — K7031 Alcoholic cirrhosis of liver with ascites: Secondary | ICD-10-CM | POA: Diagnosis not present

## 2017-12-05 DIAGNOSIS — K7040 Alcoholic hepatic failure without coma: Secondary | ICD-10-CM | POA: Diagnosis not present

## 2017-12-05 LAB — COMPREHENSIVE METABOLIC PANEL
ALT: 31 U/L (ref 17–63)
ANION GAP: 11 (ref 5–15)
AST: 62 U/L — AB (ref 15–41)
Albumin: 2.8 g/dL — ABNORMAL LOW (ref 3.5–5.0)
Alkaline Phosphatase: 77 U/L (ref 38–126)
BILIRUBIN TOTAL: 28.1 mg/dL — AB (ref 0.3–1.2)
BUN: 69 mg/dL — AB (ref 6–20)
CHLORIDE: 102 mmol/L (ref 101–111)
CO2: 27 mmol/L (ref 22–32)
Calcium: 9.2 mg/dL (ref 8.9–10.3)
Creatinine, Ser: 2.75 mg/dL — ABNORMAL HIGH (ref 0.61–1.24)
GFR, EST AFRICAN AMERICAN: 26 mL/min — AB (ref >60)
GFR, EST NON AFRICAN AMERICAN: 22 mL/min — AB (ref >60)
Glucose, Bld: 131 mg/dL — ABNORMAL HIGH (ref 65–99)
POTASSIUM: 3.2 mmol/L — AB (ref 3.5–5.1)
Sodium: 140 mmol/L (ref 135–145)
TOTAL PROTEIN: 6.1 g/dL — AB (ref 6.5–8.1)

## 2017-12-05 LAB — CBC
HEMATOCRIT: 24.5 % — AB (ref 39.0–52.0)
Hemoglobin: 8.4 g/dL — ABNORMAL LOW (ref 13.0–17.0)
MCH: 32.4 pg (ref 26.0–34.0)
MCHC: 34.3 g/dL (ref 30.0–36.0)
MCV: 94.6 fL (ref 78.0–100.0)
Platelets: 125 10*3/uL — ABNORMAL LOW (ref 150–400)
RBC: 2.59 MIL/uL — AB (ref 4.22–5.81)
RDW: 21.1 % — AB (ref 11.5–15.5)
WBC: 11.3 10*3/uL — AB (ref 4.0–10.5)

## 2017-12-05 LAB — PROTIME-INR
INR: 3.05
Prothrombin Time: 31.3 seconds — ABNORMAL HIGH (ref 11.4–15.2)

## 2017-12-05 MED ORDER — SODIUM CHLORIDE 0.9 % IV SOLN: 50.0000 ug/h | INTRAVENOUS | Status: DC

## 2017-12-05 MED ORDER — POLYETHYLENE GLYCOL 3350 17 G PO PACK: 17.0000 g | PACK | Freq: Two times a day (BID) | ORAL | 0 refills | Status: DC

## 2017-12-05 MED ORDER — ONDANSETRON HCL 4 MG/2ML IJ SOLN: 4.0000 mg | Freq: Four times a day (QID) | INTRAMUSCULAR | 0 refills | Status: DC | PRN

## 2017-12-05 MED ORDER — LACTULOSE 10 GM/15ML PO SOLN: 30.0000 g | Freq: Two times a day (BID) | ORAL | 0 refills | Status: DC

## 2017-12-05 MED ORDER — DARBEPOETIN ALFA 150 MCG/0.3ML IJ SOSY: 150.0000 ug | PREFILLED_SYRINGE | INTRAMUSCULAR | Status: DC

## 2017-12-05 MED ORDER — POTASSIUM CHLORIDE CRYS ER 20 MEQ PO TBCR
40.0000 meq | EXTENDED_RELEASE_TABLET | Freq: Every day | ORAL | Status: DC
  Administered 2017-12-05: 40 meq via ORAL
  Filled 2017-12-05: qty 2

## 2017-12-05 MED ORDER — DARBEPOETIN ALFA 150 MCG/0.3ML IJ SOSY
150.0000 ug | PREFILLED_SYRINGE | INTRAMUSCULAR | Status: DC
  Administered 2017-12-05: 150 ug via SUBCUTANEOUS
  Filled 2017-12-05: qty 0.3

## 2017-12-05 MED ORDER — MAGNESIUM OXIDE 400 (241.3 MG) MG PO TABS: 400.0000 mg | ORAL_TABLET | Freq: Every day | ORAL | Status: DC

## 2017-12-05 MED ORDER — FUROSEMIDE 10 MG/ML IJ SOLN: 40.0000 mg | Freq: Every day | INTRAMUSCULAR | 0 refills | Status: DC

## 2017-12-05 MED ORDER — DIPHENHYDRAMINE HCL 12.5 MG/5ML PO ELIX: 12.5000 mg | ORAL_SOLUTION | Freq: Four times a day (QID) | ORAL | 0 refills | Status: DC | PRN

## 2017-12-05 MED ORDER — OXYCODONE HCL 5 MG PO TABS: 5.0000 mg | ORAL_TABLET | Freq: Four times a day (QID) | ORAL | 0 refills | Status: DC | PRN

## 2017-12-05 MED ORDER — MIDODRINE HCL 10 MG PO TABS: 10.0000 mg | ORAL_TABLET | Freq: Three times a day (TID) | ORAL | Status: DC

## 2017-12-05 MED ORDER — POTASSIUM CHLORIDE CRYS ER 20 MEQ PO TBCR: 40.0000 meq | EXTENDED_RELEASE_TABLET | Freq: Every day | ORAL | Status: DC

## 2017-12-05 MED ORDER — IPRATROPIUM-ALBUTEROL 0.5-2.5 (3) MG/3ML IN SOLN: 3.0000 mL | Freq: Four times a day (QID) | RESPIRATORY_TRACT | Status: DC | PRN

## 2017-12-05 MED ORDER — PRO-STAT SUGAR FREE PO LIQD: 30.0000 mL | Freq: Two times a day (BID) | ORAL | 0 refills | Status: DC

## 2017-12-05 NOTE — Progress Notes (Signed)
Physical Therapy Treatment Patient Details Name: Logan Patrick MRN: [id number] DOB: [birth year] Today's Date: 12/05/2017    History of Present Illness Pt is a 67 y.o. male admitted 11/21/17 after falling at work noted to have hemorrhagic shock secondary to massive left thigh hematoma secondary to coagulopathy and thrombocytopenia from advanced liver cirrhosis. Pt sufferred a syncopal vs seizure in ED that resolved with IVF resucitation. MRI shows no acute intracranial abnormality. Pt also with hepatic failure in setting of alcoholic cirrhosis. PMH includes liver failure, HTN.     PT Comments    Pt continues to progress with mobility and reports L thigh swelling is decreasing. Pt able to perform bed mobility with supervision and able to ambulate >400 feet with RW. Pt trialed ambulation without AD but became unsteady and had to grip IV pole for stability. Current recommendations remain appropriate.  Follow Up Recommendations  Home health PT;Supervision for mobility/OOB     Equipment Recommendations  Rolling walker with 5" wheels;3in1 (PT)    Recommendations for Other Services       Precautions / Restrictions Precautions Precautions: Fall Precaution Comments: L thigh hematoma    Mobility  Bed Mobility Overal bed mobility: Needs Assistance Bed Mobility: Supine to Sit     Supine to sit: HOB elevated;Supervision     General bed mobility comments: HOb 20 degrees with pt able to pivot to eOB without physical assist  Transfers Overall transfer level: Needs assistance   Transfers: Sit to/from Stand Sit to Stand: Min guard         General transfer comment: cues for hand placement to push from surface  Ambulation/Gait Ambulation/Gait assistance: Supervision Ambulation Distance (Feet): 400 Feet Assistive device: Rolling walker (2 wheeled);None Gait Pattern/deviations: Wide base of support;Decreased weight shift to left;Step-through pattern;Decreased stance time - left   Gait  velocity interpretation: <1.8 ft/sec, indicate of risk for recurrent falls General Gait Details: pt initially walked 8' to toilet then long hall, pt with wide BOS due to edema with increased gait tolerance with pt able to walk 50' with use of IV pole for support but noted hesistance and decreased stability without bil UE support   Stairs             Wheelchair Mobility    Modified Rankin (Stroke Patients Only)       Balance           Standing balance support: No upper extremity supported   Standing balance comment: Pt able to stand without UE support and march in place without LOB. However pt demonstrates instability with ambulation when not using AD                            Cognition Arousal/Alertness: Awake/alert Behavior During Therapy: WFL for tasks assessed/performed Overall Cognitive Status: Within Functional Limits for tasks assessed                                        Exercises      General Comments        Pertinent Vitals/Pain Pain Assessment: No/denies pain    Home Living                      Prior Function            PT Goals (current goals can now be found in  the care plan section) Progress towards PT goals: Progressing toward goals    Frequency    Min 2X/week      PT Plan Current plan remains appropriate;Frequency needs to be updated    Co-evaluation              AM-PAC PT "6 Clicks" Daily Activity  Outcome Measure  Difficulty turning over in bed (including adjusting bedclothes, sheets and blankets)?: A Little Difficulty moving from lying on back to sitting on the side of the bed? : A Lot Difficulty sitting down on and standing up from a chair with arms (e.g., wheelchair, bedside commode, etc,.)?: A Little Help needed moving to and from a bed to chair (including a wheelchair)?: A Little Help needed walking in hospital room?: A Little Help needed climbing 3-5 steps with a railing? :  A Little 6 Click Score: 17    End of Session Equipment Utilized During Treatment: Gait belt Activity Tolerance: Patient tolerated treatment well Patient left: in chair;with call bell/phone within reach;with family/visitor present Nurse Communication: Mobility status PT Visit Diagnosis: Other abnormalities of gait and mobility (R26.89);Unsteadiness on feet (R26.81)     Time: 9381-0175 PT Time Calculation (min) (ACUTE ONLY): 38 min  Charges:  $Gait Training: 23-37 mins $Therapeutic Activity: 8-22 mins                    G Codes:       Gabe Edson Deridder, SPT   [name] 12/05/2017, 1:28 PM

## 2017-12-05 NOTE — Progress Notes (Signed)
Subjective: Since I last evaluated the patient, he seems to be doing somewhat better clinically. He seems to be more awake alert and conversant today. He denies having any abdominal pain nausea vomiting melena or hematochezia..   Objective: Vital signs in last 24 hours: Temp:  [97.4 F (36.3 C)-97.8 F (36.6 C)] 97.8 F (36.6 C) (05/13 1846) Pulse Rate:  [79-85] 81 (05/13 1846) BP: (106-122)/(66-71) 121/69 (05/13 1846) SpO2:  [95 %-98 %] 95 % (05/13 0616) Weight:  [124.8 kg (275 lb 3.2 oz)] 124.8 kg (275 lb 3.2 oz) (05/13 0616) Last BM Date: 12/04/17  Intake/Output from previous day: 05/12 0701 - 05/13 0700 In: 975 [P.O.:600; I.V.:175; IV Piggyback:200] Out: 2400 [Urine:2400] Intake/Output this shift: No intake/output data recorded.  General appearance: alert, cooperative, fatigued, icteric and morbidly obese Resp: clear to auscultation bilaterally Cardio: regular rate and rhythm, S1, S2 normal, no murmur, click, rub or gallop GI: soft, non-tender; bowel sounds normal; no masses,  no organomegaly Extremities: edema 2+ bilaterally; large hematoma on the left thigh  Lab Results: Recent Labs    12/03/17 0508 12/04/17 0330 12/05/17 0357  WBC 10.7* 10.9* 11.3*  HGB 8.8* 8.2* 8.4*  HCT 26.1* 24.0* 24.5*  PLT 108* 117* 125*   BMET Recent Labs    12/03/17 0508 12/04/17 0330 12/05/17 0357  NA 134* 136 140  K 3.5 3.1* 3.2*  CL 97* 97* 102  CO2 26 26 27   GLUCOSE 128* 134* 131*  BUN 69* 70* 69*  CREATININE 2.78* 2.78* 2.75*  CALCIUM 9.1 9.0 9.2   LFT Recent Labs    12/05/17 0357  PROT 6.1*  ALBUMIN 2.8*  AST 62*  ALT 31  ALKPHOS 77  BILITOT 28.1*   PT/INR Recent Labs    12/05/17 0357  LABPROT 31.3*  INR 3.05     Medications: I have reviewed the patient's current medications.  Assessment/Plan: 1) Decompensated alcoholic cirrhosis with a large hematoma on the left thigh/AKI-it is encouraging to see that the patient will be transferred to [facility] for possible OLT as the patient has been abstinent from using alcohol for the last 5 years. Continue present care. 2) Anemia-multifactorial worsened by hematoma in the left thigh-hemoglobin is 8.4 g/dL today  LOS: 14 days   Logan Patrick 12/05/2017, 7:15 PM

## 2017-12-05 NOTE — Discharge Summary (Signed)
Physician Discharge Summary  Logan Patrick PZW:[id number] DOB: 10/13/1950 DOA: 11/21/2017  PCP: [name], MD  Admit date: 11/21/2017 Discharge date: 12/05/2017  Admitted From: Home  Disposition:  [facility]   Recommendations for Outpatient Follow-up:  [place]: N/A  Equipment/Devices: N/A  Discharge Condition: Stable  CODE STATUS: FULL Diet recommendation: Renal  Brief/Interim Summary: Mr. Prichard is a 67 y.o. M with hx of alcoholic cirrhosis, diagnosed in 2014, abstinent since 2014 as well as HTN who presented 14 days ago with fall, developed syncope in ER, found to have left thigh hematoma from the fall causing hypotension/hemorrhagic shock.    Initially admitted for hemorrhagic shock on 4/29: -Given 8 units FFP to correct coagulopathy and 3 units PRBCs initially --> stabilized -Given additional 8 units FFP over first hospital week to try to temporarily correct INR, slow bleeding, as well as an additional 5 units of blood; Hgb stabilized by hospital day 4  -Orthopedic surgery were consulted; given ability to stabilize patient with fluids, they recommended nonoperative management, compression hose -After stabilization of his hematoma, it became clear that patient's liver cirrhosis was decompensated, MELD 35 -Furthermore, during that time, he began to develop new renal failure -Referral to transplant center was recommended and initiated -Nephrology were consulted, started empirically on midodrine, octreotide, albumin       Acute kidney injury Cr normal on admission, 0.99 mg/dL.  Contrast load given for CT leg, after which creatinine increased to 2.34 mg/dL, then returned to 1.09 mg/dL.  Started rising again on 5/7.  Differential includes hepatorenal syndrome type 2, congestive nephropathy, ATN, contrast nephropathy, pigment nephropathy.  Treating HRS empirically. FeNA 0.3%.  UNa very low.  Urine sediment bland.  In last two days, had had very  good UOP (2.4L yesterday) with Lasix 40 IV. -Continue albumin, midodrine, octreotide gtt -Continue Lasix 40 IV daily  Alcoholic cirrhosis Thrombocytopenia Patient diagnosed with cirrhosis in 2014, quit drinking at that time per Dr. [name], abstinent since.  Hepatitis serologies back in 2014 were negative.    Was well compensated until this spring, at which time he developed jaundice after using some OTC cold therapy.  MELD score went up to ~30, referral for transplant was initiated or was about to be initiated.  Have discussed with Dr. [name], [facility], and Dr. [name] from [facility], both of whom are willing to evaluate patient for transplant in referral.   Today spoke with Transfer [facility] who identified Dr. Dossie Der from [facility] as the accepting physician, now that we have insurance authorization.  No new ascites, confusion. -Continue lactulose, rifaximin (these are home medicines)  Left thigh hematoma Hemorrhagic shock Acute blood loss anemia Mild trauma preceding the hematoma (was at work, lifting a pallet or box, dizzy and fell, landed on left thigh).  Developed hematoma due to coagulopathy.  Due to underlying liver failure it will not reverse for any appreciable time with medical treatments or transfusion.  There was brief concern about infection, and he got 2 doses Vancomycin, but he has never had fever, and this was stopped without worsening.  Again, leg pain appears to be improving.  Still able to walk, transfer, participate in self cares.   Orthopedics asked [place] to stop the ACE wrap, and so at present he has no compression directly on the hematoma.   Hypertension Not on antihypertensives anymore prior to admission.       Discharge Diagnoses:  Principal Problem:   Hemorrhagic shock ([place]) Active Problems:  Hypokalemia   Hypotension   Thrombocytopenia ([facility])   Elevated troponin   Syncope   AKI (acute kidney injury) ([place])   Thigh hematoma, left, initial encounter    Acute blood loss anemia   Pressure injury of skin   Hypomagnesemia   Severe protein-calorie malnutrition ([facility])   Alcoholic cirrhosis of liver without ascites ([place])    Discharge Instructions   Allergies as of 12/05/2017   No Known Allergies     Medication List    STOP taking these medications   albuterol-ipratropium 18-103 MCG/ACT inhaler Commonly known as:  COMBIVENT Replaced by:  ipratropium-albuterol 0.5-2.5 (3) MG/3ML Soln   B COMPLEX-B12 PO   ibuprofen 200 MG tablet Commonly known as:  ADVIL,MOTRIN   multivitamin with minerals Tabs tablet     TAKE these medications   Darbepoetin Alfa 150 MCG/0.3ML Sosy injection Commonly known as:  ARANESP Inject 0.3 mLs (150 mcg total) into the skin every Monday at 6 PM. Start taking on:  12/12/2017   diphenhydrAMINE 12.5 MG/5ML elixir Commonly known as:  BENADRYL Take 5 mLs (12.5 mg total) by mouth every 6 (six) hours as needed for itching or allergies.   feeding supplement (PRO-STAT SUGAR FREE 64) Liqd Take 30 mLs by mouth 2 (two) times daily.   furosemide 10 MG/ML injection Commonly known as:  LASIX Inject 4 mLs (40 mg total) into the vein daily. Start taking on:  12/06/2017   ipratropium-albuterol 0.5-2.5 (3) MG/3ML Soln Commonly known as:  DUONEB Inhale 3 mLs into the lungs every 6 (six) hours as needed. Replaces:  albuterol-ipratropium 18-103 MCG/ACT inhaler   lactulose 10 GM/15ML solution Commonly known as:  CHRONULAC Take 45 mLs (30 g total) by mouth 2 (two) times daily.   magnesium oxide 400 (241.3 Mg) MG tablet Commonly known as:  MAG-OX Take 1 tablet (400 mg total) by mouth daily. Start taking on:  12/06/2017   midodrine 10 MG tablet Commonly known as:  PROAMATINE Take 1 tablet (10 mg total) by mouth 3 (three) times daily with meals. Start taking on:  12/06/2017   octreotide 500 mcg in sodium chloride 0.9 % 250 mL Inject 50 mcg/hr into the vein continuous.   ondansetron 4 MG/2ML Soln injection Commonly  known as:  ZOFRAN Inject 2 mLs (4 mg total) into the vein every 6 (six) hours as needed for nausea or vomiting.   oxyCODONE 5 MG immediate release tablet Commonly known as:  Oxy IR/ROXICODONE Take 1 tablet (5 mg total) by mouth every 6 (six) hours as needed for moderate pain.   pantoprazole 40 MG tablet Commonly known as:  PROTONIX Take 40 mg by mouth 2 (two) times daily.   polyethylene glycol packet Commonly known as:  MIRALAX / GLYCOLAX Take 17 g by mouth 2 (two) times daily.   potassium chloride SA 20 MEQ tablet Commonly known as:  K-DUR,KLOR-CON Take 2 tablets (40 mEq total) by mouth daily. Start taking on:  12/06/2017   rifaximin 550 MG Tabs tablet Commonly known as:  XIFAXAN Take 550 mg by mouth 2 (two) times daily.       No Known Allergies  Consultations:  Nephrology  Gastroenterology   Procedures/Studies: Dg Chest 1 View  Result Date: 11/21/2017 CLINICAL DATA:  Near syncope with jaundice EXAM: CHEST  1 VIEW COMPARISON:  10/11/2017 FINDINGS: Possible trace left effusion. No focal consolidation. Stable cardiomediastinal silhouette with mild vascular congestion. Mild diffuse interstitial opacity compared to prior. No pneumothorax. IMPRESSION: 1. Probable tiny left effusion 2. Minimal vascular congestion.  Mild diffuse increased interstitial opacity compared to prior, possible mild edema. Electronically Signed   By: Donavan Foil M.D.   On: 11/21/2017 18:38   Ct Head Wo Contrast  Result Date: 11/21/2017 CLINICAL DATA:  Altered mental status and seizure. EXAM: CT HEAD WITHOUT CONTRAST TECHNIQUE: Contiguous axial images were obtained from the base of the skull through the vertex without intravenous contrast. COMPARISON:  CT head dated Dec 11, 2012. FINDINGS: Brain: No evidence of acute infarction, hemorrhage, hydrocephalus, extra-axial collection or mass lesion/mass effect. Stable cerebral atrophy and chronic microvascular ischemic changes. Vascular: No hyperdense vessel or  unexpected calcification. Skull: Normal. Negative for fracture or focal lesion. Sinuses/Orbits: No acute finding. Other: None. IMPRESSION: 1.  No acute intracranial abnormality. Electronically Signed   By: Titus Dubin M.D.   On: 11/21/2017 16:02   Mr Brain Wo Contrast  Result Date: 11/22/2017 CLINICAL DATA:  Altered level of consciousness. Liver failure cirrhosis EXAM: MRI HEAD WITHOUT CONTRAST TECHNIQUE: Multiplanar, multiecho pulse sequences of the brain and surrounding structures were obtained without intravenous contrast. COMPARISON:  CT head 11/21/2017 FINDINGS: Brain: Moderate atrophy. Ventricular enlargement consistent with atrophy. Negative for acute infarct. Minimal chronic changes in the white matter. Negative for hemorrhage, mass, or fluid collection Vascular: Normal arterial flow voids Skull and upper cervical spine: Negative Sinuses/Orbits: Negative Other: None IMPRESSION: Moderate atrophy.  No acute intracranial abnormality. Electronically Signed   By: Franchot Gallo M.D.   On: 11/22/2017 10:39   Ct Abdomen Pelvis W Contrast  Result Date: 11/21/2017 CLINICAL DATA:  Near syncope, jaundice, fall. History of liver failure. EXAM: CT ABDOMEN AND PELVIS WITH CONTRAST TECHNIQUE: Multidetector CT imaging of the abdomen and pelvis was performed using the standard protocol following bolus administration of intravenous contrast. CONTRAST:  123mL ISOVUE-300 IOPAMIDOL (ISOVUE-300) INJECTION 61% COMPARISON:  Right upper quadrant ultrasound dated 09/30/2017 FINDINGS: Lower chest: Trace left pleural effusion. Hepatobiliary: Cirrhosis.  No focal hepatic lesion is seen. Cholelithiasis, without associated inflammatory changes. No intrahepatic or extrahepatic ductal dilatation. Pancreas: Mild fluid/ascites along the pancreatic tail (series 2/image 30), nonspecific. No pancreatic ductal dilatation or atrophy. Spleen: Spleen is normal in size. Adrenals/Urinary Tract: Adrenal glands are within normal limits.  Kidneys are within normal limits. No hydronephrosis. Bladder is underdistended but unremarkable. Stomach/Bowel: Stomach is within normal limits. No evidence of bowel obstruction. Normal appendix (series 2/image 45). Mild sigmoid diverticulosis, without evidence of diverticulitis. Vascular/Lymphatic: No evidence of abdominal aortic aneurysm. Atherosclerotic calcifications of the abdominal aorta and branch vessels. Dominant splenorenal shunt.  Diminutive portal vein, patent. No suspicious abdominopelvic lymphadenopathy. Reproductive: Prostate is unremarkable. Other: Small volume abdominopelvic ascites, predominantly perihepatic and perisplenic. Fat/fluid in the left inguinal canal. Musculoskeletal: Visualized osseous structures are within normal limits. IMPRESSION: Mild fluid/ascites along the pancreatic tail, nonspecific. Mild acute pancreatitis is not excluded. However, this may also be related to the patient's abdominopelvic ascites. Cirrhosis. Portal vein is diminutive but patent. Dominant splenorenal shunt. Small volume abdominopelvic ascites. Cholelithiasis, without associated inflammatory changes. Trace left pleural effusion. Electronically Signed   By: Julian Hy M.D.   On: 11/21/2017 18:40   Ct Hip Left Wo Contrast  Result Date: 11/27/2017 CLINICAL DATA:  Hip pain chronic. Massive hip/thigh hematoma with worsening swelling today. EXAM: CT OF THE LEFT HIP WITHOUT CONTRAST TECHNIQUE: Multidetector CT imaging of the left hip was performed according to the standard protocol. Multiplanar CT image reconstructions were also generated. COMPARISON:  Pelvic CT and left hip radiographs 11/21/2017. FINDINGS: Bones/Joint/Cartilage Examination includes the left hemipelvis and most of  the left thigh. Portions of the right thigh are included on the coronal bone windows. No evidence of acute fracture or dislocation. There is no evidence of femoral head avascular necrosis. Mild degenerative changes are present at the  hips, sacroiliac joints and symphysis pubis. No large hip joint effusion. Ligaments Not relevant for exam/indication. Muscles and Tendons No definite focal muscular abnormality. Soft tissues There is a large hematoma anteriorly in the mid left thigh. This abuts the rectus femoris muscle and measures approximately 11.9 x 7.3 x 11.7 cm. There is a fluid-fluid level in this collection, consistent with hematocrit effect. There is diffuse edema throughout the surrounding subcutaneous fat which has increased from the prior pelvic CT. Fluid extends into the left inguinal canal as before. There is mild pelvic ascites. Scattered vascular calcifications are present. IMPRESSION: 1. Large hematoma anteriorly in the mid left thigh with associated hematocrit effect. This appears superficial to the rectus femoris muscle. Increasing edema throughout the surrounding subcutaneous fat. 2. No evidence of acute fracture or dislocation. Electronically Signed   By: Richardean Sale M.D.   On: 11/27/2017 12:47   Dg Chest Port 1 View  Result Date: 11/24/2017 CLINICAL DATA:  Respiratory failure, shortness of breath, cirrhosis, former smoker. EXAM: PORTABLE CHEST 1 VIEW COMPARISON:  Portable chest x-ray of Nov 24, 2017 FINDINGS: The lungs are mildly hypoinflated. The lung markings are coarse in the infrahilar regions. There is no alveolar infiltrate or pleural effusion. The heart is top-normal in size. The pulmonary vascularity is normal. The bony thorax exhibits no acute abnormality. IMPRESSION: Interval improvement in the appearance of the pulmonary interstitium suggests decreased interstitial edema. No alveolar pneumonia nor significant pleural effusion. Electronically Signed   By: David  Martinique M.D.   On: 11/24/2017 09:13   Dg Hip Unilat With Pelvis 2-3 Views Left  Result Date: 11/21/2017 CLINICAL DATA:  Pain following fall EXAM: DG HIP (WITH OR WITHOUT PELVIS) 2-3V LEFT COMPARISON:  None. FINDINGS: Frontal pelvis as well as frontal  and lateral left hip images were obtained. No fracture or dislocation. There is slight symmetric narrowing of both hip joints. No erosive change. IMPRESSION: No fracture or dislocation. There is slight symmetric narrowing of both hip joints. Electronically Signed   By: Lowella Grip III M.D.   On: 11/21/2017 14:59  Echocardiogram 11/23/17 Study Conclusions  - Left ventricle: The cavity size was normal. There was mild   concentric hypertrophy. Systolic function was vigorous. The   estimated ejection fraction was in the range of 65% to 70%. Wall   motion was normal; there were no regional wall motion   abnormalities. - Aortic valve: There was very mild stenosis. There was no   regurgitation. Peak velocity (S): 251 cm/s. Mean gradient (S): 12   mm Hg. Valve area (VTI): 2.59 cm^2. Valve area (Vmax): 2.66 cm^2.   Valve area (Vmean): 2.88 cm^2. - Mitral valve: Transvalvular velocity was within the normal range.   There was no evidence for stenosis. There was mild regurgitation. - Left atrium: The atrium was moderately dilated. - Right ventricle: The cavity size was normal. Wall thickness was   normal. Systolic function was normal. - Atrial septum: No defect or patent foramen ovale was identified. - Pulmonary arteries: Systolic pressure was within the normal   range. PA peak pressure: 28 mm Hg (S).   Vas [place] Lower extremity Duplex Left Technical Findings: Difficult to compress left femoral vein due to large thigh hematoma. Femoral vein appears patent with color flow Doppler.  Final Interpretation: Right: There is no evidence of deep vein thrombosis in the lower extremity. No cystic structure found in the popliteal fossa. Left: There is no evidence of a obvious deep vein thrombosis in the lower extremity . A very large hematoma in the left thigh       Subjective: Feeling sleepy, but oriented.  Also nauseated, sometimes retching.  No confusion, no fever. No abdominal  swelling.  Discharge Exam: Vitals:   12/05/17 0942 12/05/17 1846  BP: 117/69 121/69  Pulse: 81 81  Resp:    Temp:  97.8 F (36.6 C)  SpO2:     Vitals:   12/04/17 1939 12/05/17 0616 12/05/17 0942 12/05/17 1846  BP: 106/66 122/71 117/69 121/69  Pulse: 79 85 81 81  Resp:      Temp: (!) 97.4 F (36.3 C) 97.7 F (36.5 C)  97.8 F (36.6 C)  TempSrc: Oral Oral  Oral  SpO2: 98% 95%    Weight:  124.8 kg (275 lb 3.2 oz)    Height:        General: Jaundiced, sleeping but easily resouable.  Oriented, no acute distress   Cardiovascular: RRR, S1/S2 +, no rubs, no gallops Respiratory: CTA bilaterally, no wheezing, no rhonchi Abdominal: Soft, NT, ND, bowel sounds + Extremities: Massive edema, left leg bruise    The results of significant diagnostics from this hospitalization (including imaging, microbiology, ancillary and laboratory) are listed below for reference.     Microbiology: No results found for this or any previous visit (from the past 240 hour(s)).   Labs: BNP (last 3 results) No results for input(s): BNP in the last 8760 hours. Basic Metabolic Panel: Recent Labs  Lab 11/30/17 0511 12/01/17 0559 12/02/17 0600 12/03/17 0508 12/04/17 0330 12/05/17 0357  NA 130* 131* 134* 134* 136 140  K 3.7 3.4* 3.4* 3.5 3.1* 3.2*  CL 93* 93* 96* 97* 97* 102  CO2 26 27 26 26 26 27   GLUCOSE 84 109* 93 128* 134* 131*  BUN 54* 61* 68* 69* 70* 69*  CREATININE 2.06* 2.36* 2.59* 2.78* 2.78* 2.75*  CALCIUM 8.0* 8.4* 8.8* 9.1 9.0 9.2  MG 1.0*  --   --   --  1.6*  --    Liver Function Tests: Recent Labs  Lab 12/01/17 0559 12/02/17 0600 12/03/17 0508 12/04/17 0330 12/05/17 0357  AST 76* 95* 89* 67* 62*  ALT 34 38 32 29 31  ALKPHOS 90 112 100 83 77  BILITOT 21.6* 22.9* 26.9* 26.3* 28.1*  PROT 5.2* 5.8* 6.0* 5.4* 6.1*  ALBUMIN 2.0* 2.1* 2.7* 2.6* 2.8*   No results for input(s): LIPASE, AMYLASE in the last 168 hours. No results for input(s): AMMONIA in the last 168  hours. CBC: Recent Labs  Lab 11/30/17 1955 12/01/17 0559 12/02/17 0600 12/03/17 0508 12/04/17 0330 12/05/17 0357  WBC 15.5* 14.6* 12.3* 10.7* 10.9* 11.3*  NEUTROABS 12.3*  --   --   --   --   --   HGB 9.7* 9.1* 9.5* 8.8* 8.2* 8.4*  HCT 27.6* 27.0* 28.1* 26.1* 24.0* 24.5*  MCV 92.3 92.2 93.4 92.9 92.7 94.6  PLT 96* 100* 114* 108* 117* 125*   Cardiac Enzymes: Recent Labs  Lab 12/01/17 1643  CKTOTAL 87   BNP: Invalid input(s): POCBNP CBG: No results for input(s): GLUCAP in the last 168 hours. D-Dimer No results for input(s): DDIMER in the last 72 hours. Hgb A1c No results for input(s): HGBA1C in the last 72 hours. Lipid Profile No results for  input(s): CHOL, HDL, LDLCALC, TRIG, CHOLHDL, LDLDIRECT in the last 72 hours. Thyroid function studies No results for input(s): TSH, T4TOTAL, T3FREE, THYROIDAB in the last 72 hours.  Invalid input(s): FREET3 Anemia work up No results for input(s): VITAMINB12, FOLATE, FERRITIN, TIBC, IRON, RETICCTPCT in the last 72 hours. Urinalysis    Component Value Date/Time   COLORURINE AMBER (A) 12/01/2017 2208   APPEARANCEUR CLEAR 12/01/2017 2208   LABSPEC 1.010 12/01/2017 2208   PHURINE 6.0 12/01/2017 2208   GLUCOSEU NEGATIVE 12/01/2017 2208   HGBUR MODERATE (A) 12/01/2017 2208   BILIRUBINUR NEGATIVE 12/01/2017 2208   KETONESUR NEGATIVE 12/01/2017 2208   PROTEINUR NEGATIVE 12/01/2017 2208   UROBILINOGEN 1.0 11/30/2012 2041   NITRITE NEGATIVE 12/01/2017 2208   LEUKOCYTESUR SMALL (A) 12/01/2017 2208   Sepsis Labs Invalid input(s): PROCALCITONIN,  WBC,  LACTICIDVEN Microbiology No results found for this or any previous visit (from the past 240 hour(s)).   Time coordinating discharge: 45 minutes       SIGNED:   Edwin Dada, MD  [facility] 12/05/2017, 6:49 PM

## 2017-12-05 NOTE — Care Management Important Message (Signed)
Important Message  Patient Details  Name: Logan Patrick MRN: [id number] Date of Birth: March 20, 1951   Medicare Important Message Given:  Yes    [name] [name] 12/05/2017, 2:43 PM

## 2017-12-05 NOTE — Progress Notes (Signed)
PROGRESS NOTE    Logan Patrick  WNU:[id number] DOB: May 04, 1951 DOA: 11/21/2017 PCP: Logan Sacramento, MD      Brief Narrative:  Logan Patrick is a 67 y.o. M with hx of alcoholic cirrhosis, abstinent since 2014 and HTN who presented with fall, developed syncope in ER, found to have left thigh hematoma from the fall causing hypotension/hemorrhagic shock.    Initially admitted for hemorrhagic shock: -Given 8 units FFP to correct coagulopathy and 3 units PRBCs initially -Given additional 8 units FFP in first few days to try to temporarily correct INR, slow bleeding, as well as an additional 5 units of blood until Hgb stabilized by hospital day 4  -Orthopedic surgery were consulted, recommended conservative management, compression hose -After stabilization of his hematoma, it became clear that patient's liver cirrhosis was decompensated, MELD 35 -Furthermore, during that time, he began to develop new renal failure -Referral to transplant center was recommended and initiated -Nephrology were consulted   Assessment & Plan:  Left thigh hematoma Hemorrhagic shock Acute blood loss anemia Mild trauma preceding the hematoma (was at work, lifting a pallet or box, dizzy and fell, landed on left thigh).  Developed hematoma due to coagulopathy.  Due to underlying liver failure it will not reverse for any appreciable time with medical treatments or transfusion.  There was brief concern about infection, and he got 2 doses Vancomycin, but he has never had fever, and this was stopped without worsening.  Again, leg pain appears to be improving.  Still able to walk, transfer, participate in self cares.   Orthopedics asked [place] to stop the ACE wrap. -TED hose when able -Continue CBC daily -Check iron studies  Acute kidney injury Differential includes hepatorenal syndrome type 2, congestive nephropathy, ATN, contrast nephropathy, pigment nephropathy.  Treating HRS empirically. FeNA 0.3%.  UNa low.  Urine  sediment bland.  Still edematous, but he feels improving.  Cr improved again with low dose Lasix.   UOP 2.4L yesterday, net negative 1.4L. -Consult nephrology, appreciate cares -Continue albumin, midodrine, octreotide gtt -Continue Lasix 40 IV daily -Strict I/Os, daily weights  Alcoholic cirrhosis Thrombocytopenia Patient diagnosed with cirrhosis in 2014, quit drinking at that time per Logan Patrick, abstinent since.  Hepatitis serologies back in 2014 were negative.    Was well compensated until this spring, had used a cold therapy OTC, developed jaundice, and at that time, MELD score went up to ~30, referral for transplant was initiated or was about to be initiated.  Have discussed with Logan Patrick, [facility], and Logan Patrick from [facility], both of whom are willing to evaluate patient for transplant in referral.    No new ascites, confusion. -Continue lactulose, rifaximin (these are home medicines)  Syncope on admission From [facility].  Hypomagnesemia Mag level improving, still low -Continue magnesium -Check mag tomorrow  Protein calorie malnutrition -Continue prrostat  Hyponatremia From cirrhosis.  Resolved, fluid restriction in place.  Hypokalemia -Continue K    DVT prophylaxis: SCDs Code Status: FULL Family Communication: None today MDM and disposition Plan: Below labs and imaging reports were reviewed and summarized above.  He was admitted with end-stage liver disease, severe decompensated liver failure and neuropathy resulting in hematoma with life-threatening bleeding.  His meld score is 35 which indicates roughly 50% 52-month mortality.  The case has been discussed with [facility], Logan Patrick on 5/8 as well as [facility] transplant Logan Patrick on 5/10 (and [facility] at [facility] who accepted patient formally, Logan Patrick on  5/10).    He has been approved for health insurance at [facility] he is waiting in bed.  I will discuss with [facility] transfer center later today,  for acceptance to a MedSurg bed.    Consultants:   Gastroenterology  Nephrology   Procedures:   Echo 5/1 Study Conclusions  - Left ventricle: The cavity size was normal. There was mild   concentric hypertrophy. Systolic function was vigorous. The   estimated ejection fraction was in the range of 65% to 70%. Wall   motion was normal; there were no regional wall motion   abnormalities. - Aortic valve: There was very mild stenosis. There was no   regurgitation. Peak velocity (S): 251 cm/s. Mean gradient (S): 12   mm Hg. Valve area (VTI): 2.59 cm^2. Valve area (Vmax): 2.66 cm^2.   Valve area (Vmean): 2.88 cm^2. - Mitral valve: Transvalvular velocity was within the normal range.   There was no evidence for stenosis. There was mild regurgitation. - Left atrium: The atrium was moderately dilated. - Right ventricle: The cavity size was normal. Wall thickness was   normal. Systolic function was normal. - Atrial septum: No defect or patent foramen ovale was identified. - Pulmonary arteries: Systolic pressure was within the normal   range. PA peak pressure: 28 mm Hg (S).    Carotid US 5/1 Technically difficult due to severe respiratory interference  Final Interpretation: Technically difficult due to above mention limitations. Right Carotid: Velocities in the right ICA are consistent with a 1-39% stenosis.  Left Carotid: Velocities in the left ICA are consistent with a 1-39% stenosis.  Vertebrals: Bilateral vertebral arteries demonstrate antegrade flow. Subclavians: Normal flow hemodynamics were seen in bilateral subclavian       arteries.  *See table(s) above for measurements and observations.    LE doppler US Very large left thigh hematoma.  Left Technical Findings: Difficult to compress left femoral vein due to large thigh hematoma. Femoral vein appears patent with color flow Doppler.  Final Interpretation: Right: There is no evidence of deep vein  thrombosis in the lower extremity. No cystic structure found in the popliteal fossa. Left: There is no evidence of a obvious deep vein thrombosis in the lower extremity . A very large hematoma in the left thigh  *See table(s) above for measurements and observations.  Electronically signed by Ruta Hinds on 11/30/2017 at 6:00:30 PM.     Antimicrobials:   Vancomycin 5/6 >> 5/8    Subjective: Again his left leg pain is improved, although the swelling is no different in that leg.  He is still able to walk, tells me he is able to transfer with assistance.  He has had no fever, confusion, ascites.  He has nausea again, no other change.  Making a lot of urine with Lasix.      Objective: Vitals:   12/04/17 1502 12/04/17 1939 12/05/17 0616 12/05/17 0942  BP: (!) 105/56 106/66 122/71 117/69  Pulse: 83 79 85 81  Resp: 18     Temp: 97.6 F (36.4 C) (!) 97.4 F (36.3 C) 97.7 F (36.5 C)   TempSrc: Oral Oral Oral   SpO2: 100% 98% 95%   Weight:   124.8 kg (275 lb 3.2 oz)   Height:        Intake/Output Summary (Last 24 hours) at 12/05/2017 1307 Last data filed at 12/05/2017 0659 Gross per 24 hour  Intake 780 ml  Output 2400 ml  Net -1620 ml   [employer]  12/03/17 0522 12/04/17 0620 12/05/17 0616  Weight: 128.1 kg (282 lb 6.4 oz) 127 kg (280 lb) 124.8 kg (275 lb 3.2 oz)    Examination: General appearance: Obese, tired male.  Lying in bed, awake and interactive, daughter is in the room. HEENT: Icteric.  Lids and lashes normal.  Oropharynx moist, no oral lesions.  No deformity of the nose, no nasal discharge or epistaxis.    Skin: Jaundice persists.  Ecchymosis over the left thigh is unchanged.    Cardiac: RRR, no murmurs, still very impressive  Respiratory: Respiratory rate normal, lungs clear without rales or wheezing. Abdomen: Soft and nontender, no tenderness to palpation, no ascites, no distention, no scars, no hernias. MSK: Left thigh swelling is large, tense, actually  without tenderness to light palpation, and he is able to move the leg somewhat. Neuro: Oriented to person, place, and time.  Response is normal.  No asterixis.  Extraocular movements intact, cranial nerves normal. Psych: Sensorium intact and responsive to questions.  Attention good, normal judgment and insight normal.    Data Reviewed: I have personally reviewed following labs and imaging studies:  CBC: Recent Labs  Lab 11/30/17 1955 12/01/17 0559 12/02/17 0600 12/03/17 0508 12/04/17 0330 12/05/17 0357  WBC 15.5* 14.6* 12.3* 10.7* 10.9* 11.3*  NEUTROABS 12.3*  --   --   --   --   --   HGB 9.7* 9.1* 9.5* 8.8* 8.2* 8.4*  HCT 27.6* 27.0* 28.1* 26.1* 24.0* 24.5*  MCV 92.3 92.2 93.4 92.9 92.7 94.6  PLT 96* 100* 114* 108* 117* 017*   Basic Metabolic Panel: Recent Labs  Lab 11/30/17 0511 12/01/17 0559 12/02/17 0600 12/03/17 0508 12/04/17 0330 12/05/17 0357  NA 130* 131* 134* 134* 136 140  K 3.7 3.4* 3.4* 3.5 3.1* 3.2*  CL 93* 93* 96* 97* 97* 102  CO2 26 27 26 26 26 27   GLUCOSE 84 109* 93 128* 134* 131*  BUN 54* 61* 68* 69* 70* 69*  CREATININE 2.06* 2.36* 2.59* 2.78* 2.78* 2.75*  CALCIUM 8.0* 8.4* 8.8* 9.1 9.0 9.2  MG 1.0*  --   --   --  1.6*  --    GFR: Estimated Creatinine Clearance: 35.3 mL/min (A) (by C-G formula based on SCr of 2.75 mg/dL (H)). Liver Function Tests: Recent Labs  Lab 12/01/17 0559 12/02/17 0600 12/03/17 0508 12/04/17 0330 12/05/17 0357  AST 76* 95* 89* 67* 62*  ALT 34 38 32 29 31  ALKPHOS 90 112 100 83 77  BILITOT 21.6* 22.9* 26.9* 26.3* 28.1*  PROT 5.2* 5.8* 6.0* 5.4* 6.1*  ALBUMIN 2.0* 2.1* 2.7* 2.6* 2.8*   No results for input(s): LIPASE, AMYLASE in the last 168 hours. No results for input(s): AMMONIA in the last 168 hours. Coagulation Profile: Recent Labs  Lab 11/29/17 0526 11/30/17 0511 12/01/17 0559 12/05/17 0357  INR 2.30 2.33 2.29 3.05   Cardiac Enzymes: Recent Labs  Lab 12/01/17 1643  CKTOTAL 87   BNP (last 3  results) No results for input(s): PROBNP in the last 8760 hours. HbA1C: No results for input(s): HGBA1C in the last 72 hours. CBG: No results for input(s): GLUCAP in the last 168 hours. Lipid Profile: No results for input(s): CHOL, HDL, LDLCALC, TRIG, CHOLHDL, LDLDIRECT in the last 72 hours. Thyroid Function Tests: No results for input(s): TSH, T4TOTAL, FREET4, T3FREE, THYROIDAB in the last 72 hours. Anemia Panel: No results for input(s): VITAMINB12, FOLATE, FERRITIN, TIBC, IRON, RETICCTPCT in the last 72 hours. Urine analysis:    Component  Value Date/Time   COLORURINE AMBER (A) 12/01/2017 2208   APPEARANCEUR CLEAR 12/01/2017 2208   LABSPEC 1.010 12/01/2017 2208   PHURINE 6.0 12/01/2017 2208   GLUCOSEU NEGATIVE 12/01/2017 2208   HGBUR MODERATE (A) 12/01/2017 2208   BILIRUBINUR NEGATIVE 12/01/2017 [place] 12/01/2017 2208   PROTEINUR NEGATIVE 12/01/2017 2208   UROBILINOGEN 1.0 11/30/2012 2041   NITRITE NEGATIVE 12/01/2017 2208   LEUKOCYTESUR SMALL (A) 12/01/2017 2208   Sepsis Labs: @LABRCNTIP (procalcitonin:4,lacticacidven:4)  ) No results found for this or any previous visit (from the past 240 hour(s)).       Radiology Studies: No results found.      Scheduled Meds: . darbepoetin (ARANESP) injection - NON-DIALYSIS  150 mcg Subcutaneous Q Mon-1800  . feeding supplement (Patrick-STAT SUGAR FREE 64)  30 mL Oral BID  . furosemide  40 mg Intravenous Daily  . lactulose  30 g Oral BID  . magnesium oxide  400 mg Oral Daily  . midodrine  10 mg Oral TID WC  . pantoprazole  40 mg Oral BID  . polyethylene glycol  17 g Oral BID  . potassium chloride  40 mEq Oral Daily  . rifaximin  550 mg Oral BID  . sodium chloride flush  10-40 mL Intracatheter Q12H   Continuous Infusions: . sodium chloride    . octreotide  (SANDOSTATIN)    IV infusion 50 mcg/hr (12/05/17 0806)     LOS: 14 days    Time spent: 25 minutes   Edwin Dada, MD [facility] 12/05/2017, 10:15 AM    Pager [phone] --- please page though AMION:  [email or web address] Password [email or web address] If 7PM-7AM, please contact night-coverage   [phone]

## 2017-12-05 NOTE — Progress Notes (Signed)
Pt was discharged per stretcher assisted by the [facility] Flight team. Pt was stable, not in any distress. Report given to the RN.

## 2017-12-05 NOTE — Progress Notes (Signed)
[facility] ROUNDING NOTE   Subjective:   Interval History:  2400 of UOP- crt stable- weight down- only on 40 of lasix daily  Brief history 67 y.o. M with hx of alcoholic cirrhosis, abstinent since 2014 and HTN decompensated cirrhosis with coagulopathy and renal failure. He is nonoliguric and is being currently treated as though he has hepatorenal syndrome type 2 - crt was normal at the time of admission    Objective:  Vital signs in last 24 hours:  Temp:  [97.4 F (36.3 C)-97.7 F (36.5 C)] 97.7 F (36.5 C) (05/13 0616) Pulse Rate:  [79-85] 81 (05/13 0942) Resp:  [18] 18 (05/12 1502) BP: (105-122)/(56-71) 117/69 (05/13 0942) SpO2:  [95 %-100 %] 95 % (05/13 0616) Weight:  [124.8 kg (275 lb 3.2 oz)] 124.8 kg (275 lb 3.2 oz) (05/13 0616)  Weight change: -2.177 kg (-4 lb 12.8 oz) Filed Weights   12/03/17 0522 12/04/17 0620 12/05/17 0616  Weight: 128.1 kg (282 lb 6.4 oz) 127 kg (280 lb) 124.8 kg (275 lb 3.2 oz)    Intake/Output: I/O last 3 completed shifts: In: 2150 [P.O.:1200; I.V.:650; IV Piggyback:300] Out: 3000 [Urine:3000]   Intake/Output this shift:  No intake/output data recorded.  Alert and oriented  Deeply jaundiced  CVS- RRR no murmurs  RS- CTA no rales  ABD- ascites  EXT-  Diffuse edema    Basic Metabolic Panel: Recent Labs  Lab 11/30/17 0511 12/01/17 0559 12/02/17 0600 12/03/17 0508 12/04/17 0330 12/05/17 0357  NA 130* 131* 134* 134* 136 140  K 3.7 3.4* 3.4* 3.5 3.1* 3.2*  CL 93* 93* 96* 97* 97* 102  CO2 26 27 26 26 26 27   GLUCOSE 84 109* 93 128* 134* 131*  BUN 54* 61* 68* 69* 70* 69*  CREATININE 2.06* 2.36* 2.59* 2.78* 2.78* 2.75*  CALCIUM 8.0* 8.4* 8.8* 9.1 9.0 9.2  MG 1.0*  --   --   --  1.6*  --     Liver Function Tests: Recent Labs  Lab 12/01/17 0559 12/02/17 0600 12/03/17 0508 12/04/17 0330 12/05/17 0357  AST 76* 95* 89* 67* 62*  ALT 34 38 32 29 31  ALKPHOS 90 112 100 83 77  BILITOT 21.6* 22.9* 26.9* 26.3* 28.1*   PROT 5.2* 5.8* 6.0* 5.4* 6.1*  ALBUMIN 2.0* 2.1* 2.7* 2.6* 2.8*   No results for input(s): LIPASE, AMYLASE in the last 168 hours. No results for input(s): AMMONIA in the last 168 hours.  CBC: Recent Labs  Lab 11/30/17 1955 12/01/17 0559 12/02/17 0600 12/03/17 0508 12/04/17 0330 12/05/17 0357  WBC 15.5* 14.6* 12.3* 10.7* 10.9* 11.3*  NEUTROABS 12.3*  --   --   --   --   --   HGB 9.7* 9.1* 9.5* 8.8* 8.2* 8.4*  HCT 27.6* 27.0* 28.1* 26.1* 24.0* 24.5*  MCV 92.3 92.2 93.4 92.9 92.7 94.6  PLT 96* 100* 114* 108* 117* 125*    Cardiac Enzymes: Recent Labs  Lab 12/01/17 1643  CKTOTAL 87    BNP: Invalid input(s): POCBNP  CBG: No results for input(s): GLUCAP in the last 168 hours.  Microbiology: Results for orders placed or performed during the hospital encounter of 11/21/17  Culture, blood (routine x 2)     Status: None   Collection Time: 11/21/17  3:20 PM  Result Value Ref Range Status   Specimen Description   Final    BLOOD LEFT ANTECUBITAL Performed at [facility], [address], [place], [place] [address]  Special Requests   Final    BOTTLES DRAWN AEROBIC AND ANAEROBIC Blood Culture adequate volume Performed at [facility], [address] [place], [place] [address]    Culture   Final    NO GROWTH 5 DAYS Performed at [facility], [place] [address], [place], [place] [address]    Report Status 11/26/2017 FINAL  Final  Culture, blood (routine x 2)     Status: None   Collection Time: 11/21/17  3:40 PM  Result Value Ref Range Status   Specimen Description   Final    BLOOD LEFT HAND Performed at [facility], [address] [place], [place] [address]    Special Requests   Final    BOTTLES DRAWN AEROBIC AND ANAEROBIC Blood Culture adequate volume Performed at [facility], [address] [place], [place] [address]    Culture   Final    NO GROWTH 5 DAYS Performed at [facility], [place] [address],  [place], [place] [address]    Report Status 11/26/2017 FINAL  Final  MRSA PCR Screening     Status: None   Collection Time: 11/22/17  3:34 PM  Result Value Ref Range Status   MRSA by PCR NEGATIVE NEGATIVE Final    Comment:        The GeneXpert MRSA Assay (FDA approved for NASAL specimens only), is one component of a comprehensive MRSA colonization surveillance program. It is not intended to diagnose MRSA infection nor to guide or monitor treatment for MRSA infections. Performed at [facility], [place] [address] [place], [place] [address]     Coagulation Studies: Recent Labs    12/05/17 0357  LABPROT 31.3*  INR 3.05    Urinalysis: No results for input(s): COLORURINE, LABSPEC, PHURINE, GLUCOSEU, HGBUR, BILIRUBINUR, KETONESUR, PROTEINUR, UROBILINOGEN, NITRITE, LEUKOCYTESUR in the last 72 hours.  Invalid input(s): APPERANCEUR    Imaging: No results found.   Medications:   . sodium chloride    . octreotide  (SANDOSTATIN)    IV infusion 50 mcg/hr (12/05/17 0806)   . feeding supplement (PRO-STAT SUGAR FREE 64)  30 mL Oral BID  . furosemide  40 mg Intravenous Daily  . lactulose  30 g Oral BID  . magnesium oxide  400 mg Oral Daily  . midodrine  10 mg Oral TID WC  . pantoprazole  40 mg Oral BID  . polyethylene glycol  17 g Oral BID  . rifaximin  550 mg Oral BID  . sodium chloride flush  10-40 mL Intracatheter Q12H   diphenhydrAMINE, iopamidol, ipratropium-albuterol, morphine injection, ondansetron (ZOFRAN) IV, oxyCODONE, sodium chloride flush, sodium chloride flush  Assessment/ Plan:  1.  AKI, no uremic symptoms.   differential includes : HRS, ATN, pigment nephropathy.   urine sodium < 10 consistent with hepatorenal  Being currently treated with midodrine and octreotide  2. Hyponatremia,  Urine sodium <10   Mild  Suggest water restriction- now is resolved  3. Decompensated ETOH cirrhosis, GI following. Octreotide and midodrine started. Awaiting evaluation for  transplant  Has been abstinent for about 5 years  4. Massive hematoma of L thigh req sig pRBC and FFP transfusions  hemoglobin now 8.4- check iron stores and also add darbe 5. Volume- appears overloaded- on low dose lasix- good UOP- no change  6. Hypokalemia- replete      LOS: 14 Ladaysha Soutar A @TODAY @10 :45 AM

## 2017-12-05 NOTE — Progress Notes (Signed)
Occupational Therapy Treatment Patient Details Name: Logan Patrick MRN: [id number] DOB: [birth year] Today's Date: 12/05/2017    History of present illness Pt is a 67 y.o. male admitted 11/21/17 after falling at work noted to have hemorrhagic shock secondary to massive left thigh hematoma secondary to coagulopathy and thrombocytopenia from advanced liver cirrhosis. Pt sufferred a syncopal vs seizure in ED that resolved with IVF resucitation. MRI shows no acute intracranial abnormality. Pt also with hepatic failure in setting of alcoholic cirrhosis. PMH includes liver failure, HTN.    OT comments  Pt progressing towards established OT goals. Pt performing grooming at sink with set up and supervision. Pt performing peri care while standing demonstrating difficulty to reach back; discussed compensatory techniques to increase success. Continue to recommend dc home with HHOT and will continue to follow acutely. Plan to address AE for LB ADLs at next session.    Follow Up Recommendations  Home health OT;Supervision/Assistance - 24 hour    Equipment Recommendations  Tub/shower seat;3 in 1 bedside commode(WIDE)    Recommendations for Other Services Other (comment)(Palliative Medicine Consult)    Precautions / Restrictions Precautions Precautions: Fall Precaution Comments: L thigh hematoma Restrictions Weight Bearing Restrictions: No       Mobility Bed Mobility Overal bed mobility: Needs Assistance Bed Mobility: Supine to Sit     Supine to sit: HOB elevated;Supervision Sit to supine: Min assist   General bed mobility comments: HOb 20 degrees with pt able to pivot to eOB without physical assist  Transfers Overall transfer level: Needs assistance Equipment used: None Transfers: Sit to/from Stand Sit to Stand: Min guard Stand pivot transfers: Min guard       General transfer comment: cues for hand placement to push from surface    Balance Overall balance assessment: Needs  assistance Sitting-balance support: No upper extremity supported Sitting balance-Leahy Scale: Good     Standing balance support: No upper extremity supported Standing balance-Leahy Scale: Fair Standing balance comment: Pt able to stand without UE support and march in place without LOB. However pt demonstrates instability with ambulation when not using AD                           ADL either performed or assessed with clinical judgement   ADL Overall ADL's : Needs assistance/impaired     Grooming: Standing;Oral care;Wash/dry face;Set up;Supervision/safety Grooming Details (indicate cue type and reason): Pt performing oral care and washing his face at the sink with supervision for safety               Lower Body Dressing Details (indicate cue type and reason): Pt unable to reach forward or bring ankles to knees for LB dressing. Discussed AE and pt agreeable to AE education at next session     [place] and Hygiene: Sit to/from stand;Min guard Toileting - Clothing Manipulation Details (indicate cue type and reason): Pt performing peri care standing at sink (pt with stool on bottom after sitting in recliner). Pt with difficulty reaching back for peri care. Discussed bending at knees to optimize reach and success. Pt may benefit from education on toilet aide.      Functional mobility during ADLs: Min guard;Rolling walker(without RW for sink to bed) General ADL Comments: Pt performing grooming at sink, peri care, and functional mobility. Pt demonstrating improving activity tolerance and balance though still presenting with deficits. Pt agreeable to AE education at next session     Vision  Vision Assessment?: No apparent visual deficits   Perception     Praxis      Cognition Arousal/Alertness: Awake/alert Behavior During Therapy: WFL for tasks assessed/performed Overall Cognitive Status: Within Functional Limits for tasks assessed                                           Exercises     Shoulder Instructions       General Comments BLE edema and L thigh hematoma    Pertinent Vitals/ Pain       Pain Assessment: Faces Faces Pain Scale: Hurts even more Pain Location: Left thigh Pain Descriptors / Indicators: Sore;Tender Pain Intervention(s): Monitored during session;Limited activity within patient's tolerance;Repositioned  Home Living                                          Prior Functioning/Environment              Frequency  Min 2X/week        Progress Toward Goals  OT Goals(current goals can now be found in the care plan section)  Progress towards OT goals: Progressing toward goals  Acute Rehab OT Goals Patient Stated Goal: "To do all that I can" OT Goal Formulation: With patient Time For Goal Achievement: 12/13/17 Potential to Achieve Goals: Good ADL Goals Pt Will Perform Grooming: with modified independence;standing Pt Will Perform Lower Body Bathing: with min assist;with adaptive equipment;sit to/from stand Pt Will Perform Lower Body Dressing: with min assist;sit to/from stand;with adaptive equipment Pt Will Transfer to Toilet: with modified independence;ambulating;bedside commode(BSC over toilet) Pt Will Perform Toileting - Clothing Manipulation and hygiene: with min assist;sit to/from stand Pt Will Perform Tub/Shower Transfer: Shower transfer;3 in 1;rolling walker;ambulating;shower seat  Plan Discharge plan remains appropriate;Frequency remains appropriate    Co-evaluation                 AM-PAC PT "6 Clicks" Daily Activity     Outcome Measure   Help from another person eating meals?: None Help from another person taking care of personal grooming?: A Little Help from another person toileting, which includes using toliet, bedpan, or urinal?: A Little Help from another person bathing (including washing, rinsing, drying)?: A Lot Help from another  person to put on and taking off regular upper body clothing?: A Little Help from another person to put on and taking off regular lower body clothing?: A Lot 6 Click Score: 17    End of Session Equipment Utilized During Treatment: Rolling walker;Gait belt  OT Visit Diagnosis: Other abnormalities of gait and mobility (R26.89);Pain Pain - Right/Left: Left Pain - part of body: Leg   Activity Tolerance Patient tolerated treatment well   Patient Left in bed;with call bell/phone within reach   Nurse Communication Mobility status        Time: 0932-3557 OT Time Calculation (min): 27 min  Charges: OT General Charges $OT Visit: 1 Visit OT Treatments $Self Care/Home Management : 23-37 mins  [place], OTR/L Acute Rehab Pager: [phone] Office: [place] 12/05/2017, 3:10 PM

## 2017-12-12 DIAGNOSIS — R739 Hyperglycemia, unspecified: Secondary | ICD-10-CM | POA: Insufficient documentation

## 2017-12-12 DIAGNOSIS — D849 Immunodeficiency, unspecified: Secondary | ICD-10-CM | POA: Insufficient documentation

## 2017-12-12 DIAGNOSIS — Z944 Liver transplant status: Secondary | ICD-10-CM | POA: Insufficient documentation

## 2017-12-30 ENCOUNTER — Other Ambulatory Visit: Payer: Self-pay | Admitting: *Deleted

## 2017-12-30 NOTE — Patient Outreach (Signed)
High Risk HTA pateint screening call attempted. I was not able to leave a message. This gentleman has recently had a liver transplant and it is possible he may still be in the hospital or SNF..  I also called pt's daughter's number and left a message for her to return my call.  Logan Patrick. Myrtie Neither, MSN, [facility] Gerontological Nurse Practitioner [facility] Care Management [phone]

## 2018-01-06 DIAGNOSIS — S7012XS Contusion of left thigh, sequela: Secondary | ICD-10-CM | POA: Diagnosis not present

## 2018-01-06 DIAGNOSIS — Z944 Liver transplant status: Secondary | ICD-10-CM | POA: Diagnosis not present

## 2018-01-06 DIAGNOSIS — N170 Acute kidney failure with tubular necrosis: Secondary | ICD-10-CM | POA: Diagnosis not present

## 2018-01-06 DIAGNOSIS — I10 Essential (primary) hypertension: Secondary | ICD-10-CM | POA: Diagnosis not present

## 2018-01-06 DIAGNOSIS — R609 Edema, unspecified: Secondary | ICD-10-CM | POA: Diagnosis not present

## 2018-01-06 DIAGNOSIS — R739 Hyperglycemia, unspecified: Secondary | ICD-10-CM | POA: Diagnosis not present

## 2018-01-06 DIAGNOSIS — M7981 Nontraumatic hematoma of soft tissue: Secondary | ICD-10-CM | POA: Diagnosis not present

## 2018-01-06 DIAGNOSIS — K7460 Unspecified cirrhosis of liver: Secondary | ICD-10-CM | POA: Diagnosis not present

## 2018-01-06 DIAGNOSIS — D62 Acute posthemorrhagic anemia: Secondary | ICD-10-CM | POA: Diagnosis not present

## 2018-01-06 DIAGNOSIS — R05 Cough: Secondary | ICD-10-CM | POA: Diagnosis not present

## 2018-01-06 DIAGNOSIS — R410 Disorientation, unspecified: Secondary | ICD-10-CM | POA: Diagnosis not present

## 2018-01-06 DIAGNOSIS — Z792 Long term (current) use of antibiotics: Secondary | ICD-10-CM | POA: Diagnosis not present

## 2018-01-06 DIAGNOSIS — D899 Disorder involving the immune mechanism, unspecified: Secondary | ICD-10-CM | POA: Diagnosis not present

## 2018-01-06 DIAGNOSIS — T380X5A Adverse effect of glucocorticoids and synthetic analogues, initial encounter: Secondary | ICD-10-CM | POA: Diagnosis not present

## 2018-01-06 DIAGNOSIS — M6281 Muscle weakness (generalized): Secondary | ICD-10-CM | POA: Diagnosis not present

## 2018-01-06 DIAGNOSIS — R269 Unspecified abnormalities of gait and mobility: Secondary | ICD-10-CM | POA: Diagnosis not present

## 2018-01-09 DIAGNOSIS — M6281 Muscle weakness (generalized): Secondary | ICD-10-CM | POA: Diagnosis not present

## 2018-01-11 DIAGNOSIS — R739 Hyperglycemia, unspecified: Secondary | ICD-10-CM | POA: Diagnosis not present

## 2018-01-11 DIAGNOSIS — Z792 Long term (current) use of antibiotics: Secondary | ICD-10-CM | POA: Diagnosis not present

## 2018-01-11 DIAGNOSIS — I10 Essential (primary) hypertension: Secondary | ICD-10-CM | POA: Diagnosis not present

## 2018-01-11 DIAGNOSIS — M6281 Muscle weakness (generalized): Secondary | ICD-10-CM | POA: Diagnosis not present

## 2018-01-11 DIAGNOSIS — T380X5A Adverse effect of glucocorticoids and synthetic analogues, initial encounter: Secondary | ICD-10-CM | POA: Diagnosis not present

## 2018-01-11 DIAGNOSIS — D899 Disorder involving the immune mechanism, unspecified: Secondary | ICD-10-CM | POA: Diagnosis not present

## 2018-01-11 DIAGNOSIS — Z944 Liver transplant status: Secondary | ICD-10-CM | POA: Diagnosis not present

## 2018-01-11 DIAGNOSIS — S7012XS Contusion of left thigh, sequela: Secondary | ICD-10-CM | POA: Diagnosis not present

## 2018-01-12 DIAGNOSIS — L089 Local infection of the skin and subcutaneous tissue, unspecified: Secondary | ICD-10-CM | POA: Diagnosis not present

## 2018-01-12 DIAGNOSIS — Z944 Liver transplant status: Secondary | ICD-10-CM | POA: Diagnosis not present

## 2018-01-12 DIAGNOSIS — S7012XD Contusion of left thigh, subsequent encounter: Secondary | ICD-10-CM | POA: Diagnosis not present

## 2018-01-12 DIAGNOSIS — S7012XA Contusion of left thigh, initial encounter: Secondary | ICD-10-CM | POA: Diagnosis not present

## 2018-01-12 DIAGNOSIS — Z833 Family history of diabetes mellitus: Secondary | ICD-10-CM | POA: Diagnosis not present

## 2018-01-12 DIAGNOSIS — Z298 Encounter for other specified prophylactic measures: Secondary | ICD-10-CM | POA: Diagnosis not present

## 2018-01-12 DIAGNOSIS — R739 Hyperglycemia, unspecified: Secondary | ICD-10-CM | POA: Diagnosis not present

## 2018-01-12 DIAGNOSIS — Z87891 Personal history of nicotine dependence: Secondary | ICD-10-CM | POA: Diagnosis not present

## 2018-01-12 DIAGNOSIS — T380X5A Adverse effect of glucocorticoids and synthetic analogues, initial encounter: Secondary | ICD-10-CM | POA: Diagnosis not present

## 2018-01-12 DIAGNOSIS — B9689 Other specified bacterial agents as the cause of diseases classified elsewhere: Secondary | ICD-10-CM | POA: Diagnosis not present

## 2018-01-12 DIAGNOSIS — S0990XA Unspecified injury of head, initial encounter: Secondary | ICD-10-CM | POA: Diagnosis not present

## 2018-01-12 DIAGNOSIS — Z792 Long term (current) use of antibiotics: Secondary | ICD-10-CM | POA: Diagnosis not present

## 2018-01-12 DIAGNOSIS — T8189XA Other complications of procedures, not elsewhere classified, initial encounter: Secondary | ICD-10-CM | POA: Diagnosis not present

## 2018-01-12 DIAGNOSIS — D899 Disorder involving the immune mechanism, unspecified: Secondary | ICD-10-CM | POA: Diagnosis not present

## 2018-01-12 DIAGNOSIS — I10 Essential (primary) hypertension: Secondary | ICD-10-CM | POA: Diagnosis not present

## 2018-01-12 DIAGNOSIS — S7012XS Contusion of left thigh, sequela: Secondary | ICD-10-CM | POA: Diagnosis not present

## 2018-01-12 DIAGNOSIS — W19XXXA Unspecified fall, initial encounter: Secondary | ICD-10-CM | POA: Diagnosis not present

## 2018-01-12 DIAGNOSIS — K7030 Alcoholic cirrhosis of liver without ascites: Secondary | ICD-10-CM | POA: Diagnosis not present

## 2018-01-12 DIAGNOSIS — T148XXA Other injury of unspecified body region, initial encounter: Secondary | ICD-10-CM | POA: Diagnosis not present

## 2018-01-29 DIAGNOSIS — S81802A Unspecified open wound, left lower leg, initial encounter: Secondary | ICD-10-CM | POA: Diagnosis not present

## 2018-01-29 DIAGNOSIS — Z944 Liver transplant status: Secondary | ICD-10-CM | POA: Diagnosis not present

## 2018-01-29 DIAGNOSIS — K7030 Alcoholic cirrhosis of liver without ascites: Secondary | ICD-10-CM | POA: Diagnosis not present

## 2018-01-29 DIAGNOSIS — Z87891 Personal history of nicotine dependence: Secondary | ICD-10-CM | POA: Diagnosis not present

## 2018-01-29 DIAGNOSIS — I10 Essential (primary) hypertension: Secondary | ICD-10-CM | POA: Diagnosis not present

## 2018-01-29 DIAGNOSIS — F1020 Alcohol dependence, uncomplicated: Secondary | ICD-10-CM | POA: Diagnosis not present

## 2018-01-30 ENCOUNTER — Emergency Department (HOSPITAL_COMMUNITY)
Admission: EM | Admit: 2018-01-30 | Discharge: 2018-01-30 | Disposition: A | Discharge status: Home / Self Care | Payer: PPO | Attending: Emergency Medicine | Admitting: Emergency Medicine

## 2018-01-30 ENCOUNTER — Other Ambulatory Visit: Payer: Self-pay

## 2018-01-30 ENCOUNTER — Emergency Department (HOSPITAL_COMMUNITY): Payer: PPO

## 2018-01-30 ENCOUNTER — Emergency Department (HOSPITAL_BASED_OUTPATIENT_CLINIC_OR_DEPARTMENT_OTHER): Payer: PPO

## 2018-01-30 ENCOUNTER — Encounter (HOSPITAL_COMMUNITY): Payer: Self-pay | Admitting: Emergency Medicine

## 2018-01-30 DIAGNOSIS — R109 Unspecified abdominal pain: Secondary | ICD-10-CM | POA: Diagnosis not present

## 2018-01-30 DIAGNOSIS — R000 Tachycardia, unspecified: Secondary | ICD-10-CM | POA: Diagnosis not present

## 2018-01-30 DIAGNOSIS — R0602 Shortness of breath: Secondary | ICD-10-CM | POA: Insufficient documentation

## 2018-01-30 DIAGNOSIS — I824Z3 Acute embolism and thrombosis of unspecified deep veins of distal lower extremity, bilateral: Secondary | ICD-10-CM | POA: Diagnosis not present

## 2018-01-30 DIAGNOSIS — I824Z1 Acute embolism and thrombosis of unspecified deep veins of right distal lower extremity: Secondary | ICD-10-CM | POA: Diagnosis not present

## 2018-01-30 DIAGNOSIS — W19XXXA Unspecified fall, initial encounter: Secondary | ICD-10-CM | POA: Diagnosis not present

## 2018-01-30 DIAGNOSIS — I82403 Acute embolism and thrombosis of unspecified deep veins of lower extremity, bilateral: Secondary | ICD-10-CM | POA: Diagnosis not present

## 2018-01-30 DIAGNOSIS — I82431 Acute embolism and thrombosis of right popliteal vein: Secondary | ICD-10-CM | POA: Diagnosis not present

## 2018-01-30 DIAGNOSIS — Z79899 Other long term (current) drug therapy: Secondary | ICD-10-CM | POA: Insufficient documentation

## 2018-01-30 DIAGNOSIS — R7989 Other specified abnormal findings of blood chemistry: Secondary | ICD-10-CM | POA: Diagnosis not present

## 2018-01-30 DIAGNOSIS — I2690 Septic pulmonary embolism without acute cor pulmonale: Secondary | ICD-10-CM | POA: Diagnosis not present

## 2018-01-30 DIAGNOSIS — Z87891 Personal history of nicotine dependence: Secondary | ICD-10-CM | POA: Diagnosis not present

## 2018-01-30 DIAGNOSIS — I82491 Acute embolism and thrombosis of other specified deep vein of right lower extremity: Secondary | ICD-10-CM | POA: Diagnosis not present

## 2018-01-30 DIAGNOSIS — Z944 Liver transplant status: Secondary | ICD-10-CM | POA: Insufficient documentation

## 2018-01-30 DIAGNOSIS — R110 Nausea: Secondary | ICD-10-CM | POA: Diagnosis not present

## 2018-01-30 DIAGNOSIS — R55 Syncope and collapse: Secondary | ICD-10-CM

## 2018-01-30 DIAGNOSIS — I2699 Other pulmonary embolism without acute cor pulmonale: Secondary | ICD-10-CM | POA: Insufficient documentation

## 2018-01-30 DIAGNOSIS — S7012XS Contusion of left thigh, sequela: Secondary | ICD-10-CM | POA: Diagnosis not present

## 2018-01-30 DIAGNOSIS — I2609 Other pulmonary embolism with acute cor pulmonale: Secondary | ICD-10-CM | POA: Diagnosis not present

## 2018-01-30 DIAGNOSIS — D899 Disorder involving the immune mechanism, unspecified: Secondary | ICD-10-CM | POA: Diagnosis not present

## 2018-01-30 DIAGNOSIS — I82412 Acute embolism and thrombosis of left femoral vein: Secondary | ICD-10-CM | POA: Diagnosis not present

## 2018-01-30 DIAGNOSIS — I10 Essential (primary) hypertension: Secondary | ICD-10-CM | POA: Insufficient documentation

## 2018-01-30 DIAGNOSIS — T8189XA Other complications of procedures, not elsewhere classified, initial encounter: Secondary | ICD-10-CM | POA: Diagnosis not present

## 2018-01-30 DIAGNOSIS — M546 Pain in thoracic spine: Secondary | ICD-10-CM | POA: Diagnosis present

## 2018-01-30 DIAGNOSIS — S7012XD Contusion of left thigh, subsequent encounter: Secondary | ICD-10-CM | POA: Diagnosis not present

## 2018-01-30 DIAGNOSIS — R52 Pain, unspecified: Secondary | ICD-10-CM | POA: Diagnosis not present

## 2018-01-30 HISTORY — DX: Other pulmonary embolism without acute cor pulmonale: I26.99

## 2018-01-30 LAB — COMPREHENSIVE METABOLIC PANEL
ALBUMIN: 2.9 g/dL — AB (ref 3.5–5.0)
ALT: 19 U/L (ref 0–44)
AST: 18 U/L (ref 15–41)
Alkaline Phosphatase: 108 U/L (ref 38–126)
Anion gap: 13 (ref 5–15)
BUN: 19 mg/dL (ref 8–23)
CHLORIDE: 100 mmol/L (ref 98–111)
CO2: 22 mmol/L (ref 22–32)
Calcium: 9.6 mg/dL (ref 8.9–10.3)
Creatinine, Ser: 0.86 mg/dL (ref 0.61–1.24)
GFR calc Af Amer: >60 mL/min (ref >60)
GLUCOSE: 140 mg/dL — AB (ref 70–99)
POTASSIUM: 3.7 mmol/L (ref 3.5–5.1)
SODIUM: 135 mmol/L (ref 135–145)
Total Bilirubin: 2.1 mg/dL — ABNORMAL HIGH (ref 0.3–1.2)
Total Protein: 6.3 g/dL — ABNORMAL LOW (ref 6.5–8.1)

## 2018-01-30 LAB — URINALYSIS, ROUTINE W REFLEX MICROSCOPIC
Bilirubin Urine: NEGATIVE
Glucose, UA: NEGATIVE mg/dL
Hgb urine dipstick: NEGATIVE
Ketones, ur: NEGATIVE mg/dL
Leukocytes, UA: NEGATIVE
NITRITE: NEGATIVE
PH: 5 (ref 5.0–8.0)
PROTEIN: NEGATIVE mg/dL
SPECIFIC GRAVITY, URINE: 1.02 (ref 1.005–1.030)

## 2018-01-30 LAB — CBC WITH DIFFERENTIAL/PLATELET
Abs Immature Granulocytes: 0.1 10*3/uL (ref 0.0–0.1)
Basophils Absolute: 0.1 10*3/uL (ref 0.0–0.1)
Basophils Relative: 1 %
EOS PCT: 0 %
Eosinophils Absolute: 0 10*3/uL (ref 0.0–0.7)
HEMATOCRIT: 38.8 % — AB (ref 39.0–52.0)
HEMOGLOBIN: 12 g/dL — AB (ref 13.0–17.0)
IMMATURE GRANULOCYTES: 1 %
LYMPHS ABS: 0.5 10*3/uL — AB (ref 0.7–4.0)
LYMPHS PCT: 4 %
MCH: 33 pg (ref 26.0–34.0)
MCHC: 30.9 g/dL (ref 30.0–36.0)
MCV: 106.6 fL — AB (ref 78.0–100.0)
Monocytes Absolute: 0.5 10*3/uL (ref 0.1–1.0)
Monocytes Relative: 4 %
Neutro Abs: 11.1 10*3/uL — ABNORMAL HIGH (ref 1.7–7.7)
Neutrophils Relative %: 90 %
Platelets: 153 10*3/uL (ref 150–400)
RBC: 3.64 MIL/uL — ABNORMAL LOW (ref 4.22–5.81)
RDW: 17.2 % — ABNORMAL HIGH (ref 11.5–15.5)
WBC: 12.2 10*3/uL — AB (ref 4.0–10.5)

## 2018-01-30 LAB — I-STAT CG4 LACTIC ACID, ED
LACTIC ACID, VENOUS: 1.11 mmol/L (ref 0.5–1.9)
Lactic Acid, Venous: 0.99 mmol/L (ref 0.5–1.9)

## 2018-01-30 LAB — ECHOCARDIOGRAM LIMITED
HEIGHTINCHES: 70 in
WEIGHTICAEL: 3312 [oz_av]

## 2018-01-30 LAB — TROPONIN I: Troponin I: <0.03 ng/mL (ref <0.03)

## 2018-01-30 LAB — BRAIN NATRIURETIC PEPTIDE: B Natriuretic Peptide: 198.3 pg/mL — ABNORMAL HIGH (ref 0.0–100.0)

## 2018-01-30 MED ORDER — FENTANYL CITRATE (PF) 100 MCG/2ML IJ SOLN
50.0000 ug | Freq: Once | INTRAMUSCULAR | Status: AC
  Administered 2018-01-30: 50 ug via INTRAVENOUS
  Filled 2018-01-30: qty 2

## 2018-01-30 MED ORDER — HEPARIN BOLUS VIA INFUSION
5000.0000 [IU] | Freq: Once | INTRAVENOUS | Status: AC
  Administered 2018-01-30: 5000 [IU] via INTRAVENOUS
  Filled 2018-01-30: qty 5000

## 2018-01-30 MED ORDER — OXYCODONE HCL 5 MG PO TABS
5.0000 mg | ORAL_TABLET | Freq: Once | ORAL | Status: AC
  Administered 2018-01-30: 5 mg via ORAL
  Filled 2018-01-30: qty 1

## 2018-01-30 MED ORDER — HEPARIN (PORCINE) IN NACL 100-0.45 UNIT/ML-% IJ SOLN
1400.0000 [IU]/h | INTRAMUSCULAR | Status: DC
  Administered 2018-01-30: 1400 [IU]/h via INTRAVENOUS
  Filled 2018-01-30: qty 250

## 2018-01-30 MED ORDER — HYDROMORPHONE HCL 1 MG/ML IJ SOLN
1.0000 mg | Freq: Once | INTRAMUSCULAR | Status: AC
Start: 2018-01-30 — End: 2018-01-30
  Administered 2018-01-30: 1 mg via INTRAVENOUS
  Filled 2018-01-30: qty 1

## 2018-01-30 MED ORDER — SODIUM CHLORIDE 0.9 % IV BOLUS
1000.0000 mL | Freq: Once | INTRAVENOUS | Status: AC
  Administered 2018-01-30: 1000 mL via INTRAVENOUS

## 2018-01-30 MED ORDER — HYDROMORPHONE HCL 1 MG/ML IJ SOLN
0.5000 mg | Freq: Once | INTRAMUSCULAR | Status: AC
  Administered 2018-01-30: 0.5 mg via INTRAVENOUS
  Filled 2018-01-30: qty 1

## 2018-01-30 MED ORDER — IOPAMIDOL (ISOVUE-370) INJECTION 76%
INTRAVENOUS | Status: AC
  Filled 2018-01-30: qty 100

## 2018-01-30 MED ORDER — IOPAMIDOL (ISOVUE-370) INJECTION 76%
100.0000 mL | Freq: Once | INTRAVENOUS | Status: AC | PRN
  Administered 2018-01-30: 100 mL via INTRAVENOUS

## 2018-01-30 NOTE — ED Notes (Signed)
Echo at bedside

## 2018-01-30 NOTE — ED Notes (Signed)
Urine culture tube sent down with urine sample.   

## 2018-01-30 NOTE — ED Notes (Signed)
MD requesting CDs of patients CT scans and ECHO performed. CT reports unable to burn CD but will power share to [facility]. Echo states once read will burn CD.

## 2018-01-30 NOTE — Progress Notes (Signed)
ANTICOAGULATION CONSULT NOTE - Initial Consult  Pharmacy Consult for heparin Indication: pulmonary embolus  No Known Allergies  Patient Measurements: Height: 5\' 10"  (177.8 cm) Weight: 207 lb (93.9 kg) IBW/kg (Calculated) : 73 Heparin Dosing Weight: 92 kg  Vital Signs: Temp: 98.2 F (36.8 C) (07/08 0805) Temp Source: Oral (07/08 0805) BP: 104/64 (07/08 0900) Pulse Rate: 120 (07/08 0900)  Labs: Recent Labs    01/30/18 0704  HGB 12.0*  HCT 38.8*  PLT 153  CREATININE 0.86     Assessment: Patient admitted with R thoracic pleuritic pain. Patient is s/p liver transplant in May of this year. CTA shows +bilateral acute PE with R heart strain as shown by RV/LV ration of 1.1 SCr 0.8, h/h, plts ok, LFTs wnl, Tbili 2.1.  Goal of Therapy:  Heparin level 0.3-0.7 units/ml Monitor platelets by anticoagulation protocol: Yes    Plan:  -Heparin bolus 5000 units x1 then 1400 units/hr -Daily HL, CBC -Check level in 6 hours   Harvel Quale 01/30/2018,9:26 AM

## 2018-01-30 NOTE — ED Notes (Signed)
Lab contacted regarding patients CMP results. States results should be back within minutes.

## 2018-01-30 NOTE — ED Notes (Signed)
Patient taking home medications for liver transplant. MD aware.

## 2018-01-30 NOTE — ED Notes (Signed)
Main lab contacted about blood work not in process. Reported that it was not received. After a second phone call to lab they reported they actually have the blood work. Labs processing.

## 2018-01-30 NOTE — ED Notes (Signed)
[facility] called for transport to [facility] 

## 2018-01-30 NOTE — ED Notes (Signed)
Patient given home medication from family. MD aware.

## 2018-01-30 NOTE — ED Notes (Signed)
ED Provider at bedside. 

## 2018-01-30 NOTE — ED Notes (Signed)
Patient transported to CT 

## 2018-01-30 NOTE — ED Notes (Signed)
Patient transported to X-ray 

## 2018-01-30 NOTE — Progress Notes (Signed)
  Echocardiogram 2D Echocardiogram has been performed.  Logan Patrick 01/30/2018, 10:23 AM

## 2018-01-30 NOTE — ED Notes (Addendum)
Pt monitor beeping for heart rate in the 190s. Episode lasted for about 15-20 seconds and then resolved on its own. EKG shot. MD notified. Pt in NAD.

## 2018-01-30 NOTE — ED Notes (Signed)
MD notified of patients abnormal vital signs.

## 2018-01-30 NOTE — ED Provider Notes (Signed)
[facility] EMERGENCY DEPARTMENT Provider Note  CSN: [id number] Arrival date & time: 01/30/18 6226  Chief Complaint(s) Flank Pain (right)  HPI Logan Patrick is a 67 y.o. male with a history of alcoholic cirrhosis status post liver transplant in May who presents to the emergency department with 2 days of right posterior thoracic pleuritic pain that worsened last night. No other alleviating or aggravating factors. Patient endorses mild shortness of breath.  Denies any abdominal pain, nausea, vomiting, substernal chest pain.  He denies any fevers or chills.  No cough.    Patient and ex-wife report possible prior history of DVT.  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  Stable and unchanged.  Per EMS patient was noted to be tachycardic, tachypneic and hypoxic with sats of 90 to 91% on room air.  HPI  Past Medical History Past Medical History:  Diagnosis Date  . Alcoholic cirrhosis of liver ([place])   . Hypertension   . Liver failure ([place])   . Pneumonia    Patient Active Problem List   Diagnosis Date Noted  . Pressure injury of skin 12/01/2017  . Hypomagnesemia 12/01/2017  . Severe protein-calorie malnutrition ([place]) 12/01/2017  . Alcoholic cirrhosis of liver without ascites ([place]) 12/01/2017  . Acute blood loss anemia   . Hemorrhagic shock ([place])   . AKI (acute kidney injury) ([place])   . Thigh hematoma, left, initial encounter   . Hypotension 11/21/2017  . Thrombocytopenia ([place]) 11/21/2017  . Elevated troponin 11/21/2017  . Syncope 11/21/2017  . Right hand pain 12/12/2012  . Encephalopathy, hepatic ([place]) 12/10/2012  . Acute renal insufficiency 12/08/2012  . Failure to thrive 12/05/2012  . Hypokalemia 12/02/2012  . Bronchitis 11/30/2012  . Cirrhosis of liver ([place]) 11/30/2012  . Hematuria 11/30/2012   Home Medication(s) Prior to Admission medications   Medication Sig Start Date End Date Taking? Authorizing Provider  [place]  Acids-Protein Hydrolys (FEEDING SUPPLEMENT, PRO-STAT SUGAR FREE 64,) LIQD Take 30 mLs by mouth 2 (two) times daily. 12/05/17   Danford, Suann Larry, MD  Darbepoetin Alfa (ARANESP) 150 MCG/0.3ML SOSY injection Inject 0.3 mLs (150 mcg total) into the skin every Monday at 6 PM. 12/12/17   Danford, Suann Larry, MD  diphenhydrAMINE (BENADRYL) 12.5 MG/5ML elixir Take 5 mLs (12.5 mg total) by mouth every 6 (six) hours as needed for itching or allergies. 12/05/17   Danford, Suann Larry, MD  furosemide (LASIX) 10 MG/ML injection Inject 4 mLs (40 mg total) into the vein daily. 12/06/17   Danford, Suann Larry, MD  ipratropium-albuterol (DUONEB) 0.5-2.5 (3) MG/3ML SOLN Inhale 3 mLs into the lungs every 6 (six) hours as needed. 12/05/17   Danford, Suann Larry, MD  lactulose (CHRONULAC) 10 GM/15ML solution Take 45 mLs (30 g total) by mouth 2 (two) times daily. 12/05/17   Danford, Suann Larry, MD  magnesium oxide (MAG-OX) 400 (241.3 Mg) MG tablet Take 1 tablet (400 mg total) by mouth daily. 12/06/17   Danford, Suann Larry, MD  midodrine (PROAMATINE) 10 MG tablet Take 1 tablet (10 mg total) by mouth 3 (three) times daily with meals. 12/06/17   Danford, Suann Larry, MD  octreotide 500 mcg in sodium chloride 0.9 % 250 mL Inject 50 mcg/hr into the vein continuous. 12/05/17   Danford, Suann Larry, MD  ondansetron (ZOFRAN) 4 MG/2ML SOLN injection Inject 2 mLs (4 mg total) into the vein every 6 (six) hours as needed for nausea or vomiting. 12/05/17   Danford, Suann Larry, MD  oxyCODONE (OXY IR/ROXICODONE) 5 MG immediate release tablet Take 1 tablet (5 mg total) by mouth every 6 (six) hours as needed for moderate pain. 12/05/17   Danford, Suann Larry, MD  pantoprazole (PROTONIX) 40 MG tablet Take 40 mg by mouth 2 (two) times daily.    [provider]  polyethylene glycol (MIRALAX / GLYCOLAX) packet Take 17 g by mouth 2 (two) times daily. 12/05/17   Danford, Suann Larry, MD  potassium chloride SA  (K-DUR,KLOR-CON) 20 MEQ tablet Take 2 tablets (40 mEq total) by mouth daily. 12/06/17   Danford, Suann Larry, MD  rifaximin (XIFAXAN) 550 MG TABS Take 550 mg by mouth 2 (two) times daily.    [provider]                                                                                                                                    Past Surgical History Past Surgical History:  Procedure Laterality Date  . UMBILICAL HERNIA REPAIR     Family History Family History  Problem Relation Age of Onset  . Diabetes type II Mother     Social History Social History   Tobacco Use  . Smoking status: Former Smoker    Last attempt to quit: 11/30/1976    Years since quitting: 41.1  . Smokeless tobacco: Never Used  Substance Use Topics  . Alcohol use: No  . Drug use: No   Allergies Patient has no known allergies.  Review of Systems Review of Systems All other systems are reviewed and are negative for acute change except as noted in the HPI  Physical Exam Vital Signs  I have reviewed the triage vital signs BP 130/75   Pulse (!) 116   Temp 98.9 F (37.2 C) (Oral)   Resp 20   Ht 5\' 10"  (1.778 m)   Wt 93.9 kg (207 lb)   SpO2 98%   BMI 29.70 kg/m    Physical Exam  Constitutional: He is oriented to person, place, and time. He appears well-developed and well-nourished. No distress.  HENT:  Head: Normocephalic and atraumatic.  Nose: Nose normal.  Eyes: Pupils are equal, round, and reactive to light. Conjunctivae and EOM are normal. Right eye exhibits no discharge. Left eye exhibits no discharge. No scleral icterus.  Neck: Normal range of motion. Neck supple.  Cardiovascular: Normal rate and regular rhythm. Exam reveals no gallop and no friction rub.  No murmur heard. Pulmonary/Chest: Effort normal and breath sounds normal. No stridor. Tachypnea noted. No respiratory distress. He has no wheezes. He has no rhonchi. He has no rales. He exhibits no tenderness.  Short splint  during breaths  Abdominal: Soft. He exhibits no distension. There is no tenderness. There is no rigidity, no rebound and no guarding.    Musculoskeletal: He exhibits no edema or tenderness.       Legs: Neurological: He is alert and oriented to person, place, and time.  Skin: Skin is warm and dry. No rash noted. He is not diaphoretic. No erythema.  Psychiatric: He has a normal mood and affect.  Vitals reviewed.   ED Results and Treatments Labs (all labs ordered are listed, but only abnormal results are displayed) Labs Reviewed  CBC WITH DIFFERENTIAL/PLATELET - Abnormal; Notable for the following components:      Result Value   WBC 12.2 (*)    RBC 3.64 (*)    Hemoglobin 12.0 (*)    HCT 38.8 (*)    MCV 106.6 (*)    RDW 17.2 (*)    Neutro Abs 11.1 (*)    Lymphs Abs 0.5 (*)    All other components within normal limits  CULTURE, BLOOD (ROUTINE X 2)  CULTURE, BLOOD (ROUTINE X 2)  URINALYSIS, ROUTINE W REFLEX MICROSCOPIC  COMPREHENSIVE METABOLIC PANEL  I-STAT CG4 LACTIC ACID, ED                                                                                                                         EKG  EKG Interpretation  Date/Time:  Monday January 30 2018 05:30:58 EDT Ventricular Rate:  118 PR Interval:    QRS Duration: 78 QT Interval:  306 QTC Calculation: 429 R Axis:   39 Text Interpretation:  Sinus tachycardia NO STEMI Otherwise no significant change Confirmed by Addison Lank [phone]) on 01/30/2018 6:14:43 AM Also confirmed by Addison Lank [phone]), editor Hattie Perch ([id number])  on 01/30/2018 6:52:43 AM       EKG Interpretation  Date/Time:  Monday January 30 2018 07:04:00 EDT Ventricular Rate:  188 PR Interval:    QRS Duration: 95 QT Interval:  288 QTC Calculation: 510 R Axis:   31 Text Interpretation:  Supraventricular tachycardia ST depression, probably rate related Confirmed by Addison Lank [phone]) on 01/30/2018 7:16:24 AM       Radiology Dg Chest 2  View  Result Date: 01/30/2018 CLINICAL DATA:  Shortness of breath. EXAM: CHEST - 2 VIEW COMPARISON:  Radiographs 11/24/2017 FINDINGS: Lung volumes are low. Bibasilar atelectasis and possible small pleural effusions. Similar heart size and mediastinal contours allowing for differences in technique with mild cardiomegaly. No pneumothorax or confluent airspace disease. No acute osseous abnormalities. IMPRESSION: Low lung volumes with bibasilar atelectasis. Possible small pleural effusions. Electronically Signed   By: Jeb Levering M.D.   On: 01/30/2018 06:38   Pertinent labs & imaging results that were available during my care of the patient were reviewed by me and considered in my medical decision making (see chart for details).  Medications Ordered in ED Medications  sodium chloride 0.9 % bolus 1,000 mL (1,000 mLs Intravenous New Bag/Given 01/30/18 0609)  HYDROmorphone (DILAUDID) injection 1 mg (1 mg Intravenous Given 01/30/18 2703)  Procedures Procedures  (including critical care time)  Medical Decision Making / ED Course I have reviewed the nursing notes for this encounter and the patient's prior records (if available in EHR or on provided paperwork).     Right posterior thoracic pleuritic pain.  Abdomen benign.  Lungs clear to auscultation bilaterally.  Given immunosuppression and recent liver transplant, infectious work-up was initiated.  Also have a concern for possible pleural effusion AND pulmonary embolism.  Chest x-ray without evidence of pneumonia, pneumothorax.  He did reveal mild bilateral pleural effusions.  No pulmonary edema.  Patient noted to have intermittent SVT that self resolved.  Patient will require CTA to rule out pulmonary embolism once renal function is assessed.  Patient care turned over to Dr Venora Maples at [phone]. Patient case and results  discussed in detail; please see their note for further ED managment.     This chart was dictated using voice recognition software.  Despite best efforts to proofread,  errors can occur which can change the documentation meaning.  Fatima Blank, MD 01/30/18 [phone]

## 2018-01-30 NOTE — ED Provider Notes (Signed)
Acute submassive pulmonary embolism. Started on heparin. Stat echo now. Speaks in full sentences. HR 889. bp 169 systolic.   10:58 AM Case discussed with the transplant coordinator who consulted with the inpatient rounding liver transplant team at [facility] who states they would want the patient on their service at [facility].  I discussed the case with the [facility] transfer center and am arranging transfer to [facility] at this time given his recent transplant.  Patient and family been updated as the findings.  He is still having pain likely from the pleurisy associated with the pulmonary infarct.  We will continue to monitor the patient closely while here in the emergency department.  He is at risk for worsening of his condition.  We will continue to observe closely.  CRITICAL CARE Performed by: Jola Schmidt Total critical care time: 33 minutes Critical care time was exclusive of separately billable procedures and treating other patients. Critical care was necessary to treat or prevent imminent or life-threatening deterioration. Critical care was time spent personally by me on the following activities: development of treatment plan with patient and/or surrogate as well as nursing, discussions with consultants, evaluation of patient's response to treatment, examination of patient, obtaining history from patient or surrogate, ordering and performing treatments and interventions, ordering and review of laboratory studies, ordering and review of radiographic studies, pulse oximetry and re-evaluation of patient's condition.   Personally reviewed the patient's CT images which demonstrates bilateral pulmonary emboli with pulmonary infarct in the right lower lobe.   Jola Schmidt, MD 01/30/18 1059

## 2018-01-30 NOTE — ED Triage Notes (Addendum)
Per EMS, pt from home. Pt came home from liver transplant sx rehab on Friday. Pt reports since Friday has had worsening 10/10 rt flank pain. EMS gave 275mcg Fentanyl with pain down to 3/10. Ems gave 4mg  Zofran as well. Pain starts upon inspiration. Pt reports he was maxing out on Tylenol for pain control without any pain relief. EMS placed pt on [facility] d/t pt's sats dropping after medicine. Pt A&Ox4 and has been ambulating with a walker, somewhat unsteady still.  EMS VS BP 136/89, HR 120, R 20, SpO2 90-91% room air.   Side note: Pt has wound vac to upper left leg from post fall injury at work. Wound became infected and shortly after went into liver failure and got transplant sooner than expected. Wound vac last drained Sunday.   [place] ems phoned on-call [facility] MD before transport to this ER d/t surgery being done at [facility] and confirmed it was safe to give pt pain medicine.

## 2018-01-30 NOTE — ED Notes (Signed)
1 set of blood cultures sent down to lab with blood work.

## 2018-01-31 ENCOUNTER — Other Ambulatory Visit: Payer: Self-pay

## 2018-01-31 NOTE — Patient Outreach (Signed)
[facility]) Care Management  01/31/2018  Logan Patrick Aug 06, 1950 [id number]  Transition of care  Referral date: 01/31/18 Referral source:  Discharged from [facility] on 01/27/18.  Insurance: [employer] advantage  Transition of care will be completed by primary care provider office who will refer to [facility] care management if needed.   PLAN: RNCM will close patient due to patient being enrolled in an external program.   [name] RN,BSN,CCM [facility] Telephonic  [phone]

## 2018-02-01 DIAGNOSIS — I82403 Acute embolism and thrombosis of unspecified deep veins of lower extremity, bilateral: Secondary | ICD-10-CM | POA: Insufficient documentation

## 2018-02-01 HISTORY — DX: Acute embolism and thrombosis of unspecified deep veins of lower extremity, bilateral: I82.403

## 2018-02-04 LAB — CULTURE, BLOOD (ROUTINE X 2)
Culture: NO GROWTH
Culture: NO GROWTH
SPECIAL REQUESTS: ADEQUATE

## 2018-02-06 DIAGNOSIS — Z944 Liver transplant status: Secondary | ICD-10-CM | POA: Diagnosis not present

## 2018-02-08 DIAGNOSIS — S71102A Unspecified open wound, left thigh, initial encounter: Secondary | ICD-10-CM | POA: Diagnosis not present

## 2018-02-08 DIAGNOSIS — T8140XA Infection following a procedure, unspecified, initial encounter: Secondary | ICD-10-CM | POA: Diagnosis not present

## 2018-02-08 DIAGNOSIS — Y33XXXA Other specified events, undetermined intent, initial encounter: Secondary | ICD-10-CM | POA: Diagnosis not present

## 2018-02-08 DIAGNOSIS — L7632 Postprocedural hematoma of skin and subcutaneous tissue following other procedure: Secondary | ICD-10-CM | POA: Diagnosis not present

## 2018-02-08 DIAGNOSIS — R739 Hyperglycemia, unspecified: Secondary | ICD-10-CM | POA: Diagnosis not present

## 2018-02-08 DIAGNOSIS — Z87891 Personal history of nicotine dependence: Secondary | ICD-10-CM | POA: Diagnosis not present

## 2018-02-08 DIAGNOSIS — I2690 Septic pulmonary embolism without acute cor pulmonale: Secondary | ICD-10-CM | POA: Diagnosis not present

## 2018-02-08 DIAGNOSIS — T380X5A Adverse effect of glucocorticoids and synthetic analogues, initial encounter: Secondary | ICD-10-CM | POA: Diagnosis not present

## 2018-02-08 DIAGNOSIS — L02416 Cutaneous abscess of left lower limb: Secondary | ICD-10-CM | POA: Diagnosis not present

## 2018-02-08 DIAGNOSIS — D899 Disorder involving the immune mechanism, unspecified: Secondary | ICD-10-CM | POA: Diagnosis not present

## 2018-02-08 DIAGNOSIS — Z79899 Other long term (current) drug therapy: Secondary | ICD-10-CM | POA: Diagnosis not present

## 2018-02-08 DIAGNOSIS — Z7901 Long term (current) use of anticoagulants: Secondary | ICD-10-CM | POA: Diagnosis not present

## 2018-02-08 DIAGNOSIS — Z944 Liver transplant status: Secondary | ICD-10-CM | POA: Diagnosis not present

## 2018-02-08 DIAGNOSIS — Z792 Long term (current) use of antibiotics: Secondary | ICD-10-CM | POA: Diagnosis not present

## 2018-02-08 DIAGNOSIS — T8141XA Infection following a procedure, superficial incisional surgical site, initial encounter: Secondary | ICD-10-CM | POA: Diagnosis not present

## 2018-02-08 DIAGNOSIS — Z86711 Personal history of pulmonary embolism: Secondary | ICD-10-CM | POA: Diagnosis not present

## 2018-02-08 DIAGNOSIS — I824Z3 Acute embolism and thrombosis of unspecified deep veins of distal lower extremity, bilateral: Secondary | ICD-10-CM | POA: Diagnosis not present

## 2018-02-08 DIAGNOSIS — B9561 Methicillin susceptible Staphylococcus aureus infection as the cause of diseases classified elsewhere: Secondary | ICD-10-CM | POA: Diagnosis not present

## 2018-02-08 DIAGNOSIS — Z86718 Personal history of other venous thrombosis and embolism: Secondary | ICD-10-CM | POA: Diagnosis not present

## 2018-02-08 DIAGNOSIS — R918 Other nonspecific abnormal finding of lung field: Secondary | ICD-10-CM | POA: Diagnosis not present

## 2018-02-08 DIAGNOSIS — S7012XS Contusion of left thigh, sequela: Secondary | ICD-10-CM | POA: Diagnosis not present

## 2018-02-08 DIAGNOSIS — I10 Essential (primary) hypertension: Secondary | ICD-10-CM | POA: Diagnosis not present

## 2018-02-08 DIAGNOSIS — T86818 Other complications of lung transplant: Secondary | ICD-10-CM | POA: Diagnosis not present

## 2018-02-08 DIAGNOSIS — D699 Hemorrhagic condition, unspecified: Secondary | ICD-10-CM | POA: Diagnosis not present

## 2018-02-08 DIAGNOSIS — T819XXA Unspecified complication of procedure, initial encounter: Secondary | ICD-10-CM | POA: Diagnosis not present

## 2018-02-08 DIAGNOSIS — Z0489 Encounter for examination and observation for other specified reasons: Secondary | ICD-10-CM | POA: Diagnosis not present

## 2018-02-08 DIAGNOSIS — S7012XA Contusion of left thigh, initial encounter: Secondary | ICD-10-CM | POA: Diagnosis not present

## 2018-02-14 ENCOUNTER — Other Ambulatory Visit: Payer: Self-pay

## 2018-02-14 NOTE — Patient Outreach (Signed)
[facility]) Care Management  02/14/2018  Logan Patrick [birth year] [id number]     Transition of Care Referral  Referral Date: 02/14/18 Referral Source: HTA Discharge Report Date of Admission: unknown Diagnosis: "other injury of unspecified body region" Date of Discharge:02/12/18 Facility: [place]: HTA    Referral received. No outreach warranted at this time. TOC will be completed by primary care provider office who will refer to [facility] care mgmt if needed.    Plan: RN CM will close case at this time.   Enzo Montgomery, RN,BSN,CCM [place] Management Telephonic Care Management Coordinator Direct Phone: [phone] Toll Free: [phone] Fax: [phone]

## 2018-02-15 DIAGNOSIS — Z7901 Long term (current) use of anticoagulants: Secondary | ICD-10-CM | POA: Diagnosis not present

## 2018-02-15 DIAGNOSIS — S7012XS Contusion of left thigh, sequela: Secondary | ICD-10-CM | POA: Diagnosis not present

## 2018-02-15 DIAGNOSIS — Z5181 Encounter for therapeutic drug level monitoring: Secondary | ICD-10-CM | POA: Diagnosis not present

## 2018-02-15 DIAGNOSIS — I2690 Septic pulmonary embolism without acute cor pulmonale: Secondary | ICD-10-CM | POA: Diagnosis not present

## 2018-02-15 DIAGNOSIS — D899 Disorder involving the immune mechanism, unspecified: Secondary | ICD-10-CM | POA: Diagnosis not present

## 2018-02-15 DIAGNOSIS — R739 Hyperglycemia, unspecified: Secondary | ICD-10-CM | POA: Diagnosis not present

## 2018-02-15 DIAGNOSIS — Z792 Long term (current) use of antibiotics: Secondary | ICD-10-CM | POA: Diagnosis not present

## 2018-02-15 DIAGNOSIS — T380X5A Adverse effect of glucocorticoids and synthetic analogues, initial encounter: Secondary | ICD-10-CM | POA: Diagnosis not present

## 2018-02-15 DIAGNOSIS — I824Z3 Acute embolism and thrombosis of unspecified deep veins of distal lower extremity, bilateral: Secondary | ICD-10-CM | POA: Diagnosis not present

## 2018-02-15 DIAGNOSIS — I10 Essential (primary) hypertension: Secondary | ICD-10-CM | POA: Diagnosis not present

## 2018-02-15 DIAGNOSIS — Z944 Liver transplant status: Secondary | ICD-10-CM | POA: Diagnosis not present

## 2018-02-17 DIAGNOSIS — Z944 Liver transplant status: Secondary | ICD-10-CM | POA: Diagnosis not present

## 2018-02-17 DIAGNOSIS — Z792 Long term (current) use of antibiotics: Secondary | ICD-10-CM | POA: Diagnosis not present

## 2018-02-22 DIAGNOSIS — D899 Disorder involving the immune mechanism, unspecified: Secondary | ICD-10-CM | POA: Diagnosis not present

## 2018-02-22 DIAGNOSIS — Z944 Liver transplant status: Secondary | ICD-10-CM | POA: Diagnosis not present

## 2018-02-24 DIAGNOSIS — Z09 Encounter for follow-up examination after completed treatment for conditions other than malignant neoplasm: Secondary | ICD-10-CM | POA: Diagnosis not present

## 2018-02-24 DIAGNOSIS — Z944 Liver transplant status: Secondary | ICD-10-CM | POA: Diagnosis not present

## 2018-02-27 DIAGNOSIS — L0291 Cutaneous abscess, unspecified: Secondary | ICD-10-CM | POA: Diagnosis not present

## 2018-03-01 DIAGNOSIS — D899 Disorder involving the immune mechanism, unspecified: Secondary | ICD-10-CM | POA: Diagnosis not present

## 2018-03-01 DIAGNOSIS — Z944 Liver transplant status: Secondary | ICD-10-CM | POA: Diagnosis not present

## 2018-03-02 DIAGNOSIS — Z09 Encounter for follow-up examination after completed treatment for conditions other than malignant neoplasm: Secondary | ICD-10-CM | POA: Insufficient documentation

## 2018-03-08 DIAGNOSIS — D899 Disorder involving the immune mechanism, unspecified: Secondary | ICD-10-CM | POA: Diagnosis not present

## 2018-03-08 DIAGNOSIS — Z944 Liver transplant status: Secondary | ICD-10-CM | POA: Diagnosis not present

## 2018-03-15 DIAGNOSIS — Z6833 Body mass index (BMI) 33.0-33.9, adult: Secondary | ICD-10-CM | POA: Diagnosis not present

## 2018-03-15 DIAGNOSIS — T50905A Adverse effect of unspecified drugs, medicaments and biological substances, initial encounter: Secondary | ICD-10-CM | POA: Diagnosis not present

## 2018-03-15 DIAGNOSIS — I10 Essential (primary) hypertension: Secondary | ICD-10-CM | POA: Diagnosis not present

## 2018-03-15 DIAGNOSIS — E669 Obesity, unspecified: Secondary | ICD-10-CM | POA: Diagnosis not present

## 2018-03-15 DIAGNOSIS — Z86711 Personal history of pulmonary embolism: Secondary | ICD-10-CM | POA: Diagnosis not present

## 2018-03-15 DIAGNOSIS — R600 Localized edema: Secondary | ICD-10-CM | POA: Diagnosis not present

## 2018-03-15 DIAGNOSIS — Z5181 Encounter for therapeutic drug level monitoring: Secondary | ICD-10-CM | POA: Diagnosis not present

## 2018-03-15 DIAGNOSIS — Z944 Liver transplant status: Secondary | ICD-10-CM | POA: Diagnosis not present

## 2018-03-15 DIAGNOSIS — Z794 Long term (current) use of insulin: Secondary | ICD-10-CM | POA: Diagnosis not present

## 2018-03-15 DIAGNOSIS — I8393 Asymptomatic varicose veins of bilateral lower extremities: Secondary | ICD-10-CM | POA: Diagnosis not present

## 2018-03-15 DIAGNOSIS — S7012XS Contusion of left thigh, sequela: Secondary | ICD-10-CM | POA: Diagnosis not present

## 2018-03-15 DIAGNOSIS — E875 Hyperkalemia: Secondary | ICD-10-CM | POA: Diagnosis not present

## 2018-03-15 DIAGNOSIS — I83893 Varicose veins of bilateral lower extremities with other complications: Secondary | ICD-10-CM | POA: Diagnosis not present

## 2018-03-15 DIAGNOSIS — E099 Drug or chemical induced diabetes mellitus without complications: Secondary | ICD-10-CM | POA: Diagnosis not present

## 2018-03-15 DIAGNOSIS — I2690 Septic pulmonary embolism without acute cor pulmonale: Secondary | ICD-10-CM | POA: Diagnosis not present

## 2018-03-15 DIAGNOSIS — Z86718 Personal history of other venous thrombosis and embolism: Secondary | ICD-10-CM | POA: Diagnosis not present

## 2018-03-15 DIAGNOSIS — D899 Disorder involving the immune mechanism, unspecified: Secondary | ICD-10-CM | POA: Diagnosis not present

## 2018-03-15 DIAGNOSIS — Z792 Long term (current) use of antibiotics: Secondary | ICD-10-CM | POA: Diagnosis not present

## 2018-03-15 DIAGNOSIS — R197 Diarrhea, unspecified: Secondary | ICD-10-CM | POA: Diagnosis not present

## 2018-03-15 DIAGNOSIS — Z7901 Long term (current) use of anticoagulants: Secondary | ICD-10-CM | POA: Diagnosis not present

## 2018-03-15 DIAGNOSIS — I824Z3 Acute embolism and thrombosis of unspecified deep veins of distal lower extremity, bilateral: Secondary | ICD-10-CM | POA: Diagnosis not present

## 2018-03-15 DIAGNOSIS — Z4823 Encounter for aftercare following liver transplant: Secondary | ICD-10-CM | POA: Diagnosis not present

## 2018-03-17 DIAGNOSIS — Z4589 Encounter for adjustment and management of other implanted devices: Secondary | ICD-10-CM | POA: Diagnosis not present

## 2018-03-17 DIAGNOSIS — Z4682 Encounter for fitting and adjustment of non-vascular catheter: Secondary | ICD-10-CM | POA: Diagnosis not present

## 2018-03-17 DIAGNOSIS — Z7901 Long term (current) use of anticoagulants: Secondary | ICD-10-CM | POA: Diagnosis not present

## 2018-03-17 DIAGNOSIS — Z5181 Encounter for therapeutic drug level monitoring: Secondary | ICD-10-CM | POA: Diagnosis not present

## 2018-03-29 DIAGNOSIS — Z944 Liver transplant status: Secondary | ICD-10-CM | POA: Diagnosis not present

## 2018-03-29 DIAGNOSIS — D899 Disorder involving the immune mechanism, unspecified: Secondary | ICD-10-CM | POA: Diagnosis not present

## 2018-04-03 DIAGNOSIS — Z944 Liver transplant status: Secondary | ICD-10-CM | POA: Diagnosis not present

## 2018-04-03 DIAGNOSIS — D899 Disorder involving the immune mechanism, unspecified: Secondary | ICD-10-CM | POA: Diagnosis not present

## 2018-04-05 DIAGNOSIS — T380X5A Adverse effect of glucocorticoids and synthetic analogues, initial encounter: Secondary | ICD-10-CM

## 2018-04-05 DIAGNOSIS — Z23 Encounter for immunization: Secondary | ICD-10-CM | POA: Diagnosis not present

## 2018-04-05 DIAGNOSIS — E099 Drug or chemical induced diabetes mellitus without complications: Secondary | ICD-10-CM | POA: Insufficient documentation

## 2018-04-05 HISTORY — DX: Adverse effect of glucocorticoids and synthetic analogues, initial encounter: T38.0X5A

## 2018-04-13 DIAGNOSIS — Z944 Liver transplant status: Secondary | ICD-10-CM | POA: Diagnosis not present

## 2018-04-13 DIAGNOSIS — D899 Disorder involving the immune mechanism, unspecified: Secondary | ICD-10-CM | POA: Diagnosis not present

## 2018-04-27 DIAGNOSIS — Z944 Liver transplant status: Secondary | ICD-10-CM | POA: Diagnosis not present

## 2018-04-27 DIAGNOSIS — D899 Disorder involving the immune mechanism, unspecified: Secondary | ICD-10-CM | POA: Diagnosis not present

## 2018-05-03 DIAGNOSIS — E099 Drug or chemical induced diabetes mellitus without complications: Secondary | ICD-10-CM | POA: Diagnosis not present

## 2018-05-03 DIAGNOSIS — T380X5A Adverse effect of glucocorticoids and synthetic analogues, initial encounter: Secondary | ICD-10-CM | POA: Diagnosis not present

## 2018-05-18 DIAGNOSIS — Z944 Liver transplant status: Secondary | ICD-10-CM | POA: Diagnosis not present

## 2018-05-18 DIAGNOSIS — D899 Disorder involving the immune mechanism, unspecified: Secondary | ICD-10-CM | POA: Diagnosis not present

## 2018-05-25 DIAGNOSIS — I83893 Varicose veins of bilateral lower extremities with other complications: Secondary | ICD-10-CM

## 2018-05-25 HISTORY — DX: Varicose veins of bilateral lower extremities with other complications: I83.893

## 2018-06-13 DIAGNOSIS — Z86718 Personal history of other venous thrombosis and embolism: Secondary | ICD-10-CM | POA: Diagnosis not present

## 2018-06-13 DIAGNOSIS — Z944 Liver transplant status: Secondary | ICD-10-CM | POA: Diagnosis not present

## 2018-06-13 DIAGNOSIS — Z7901 Long term (current) use of anticoagulants: Secondary | ICD-10-CM | POA: Diagnosis not present

## 2018-06-13 DIAGNOSIS — Z5181 Encounter for therapeutic drug level monitoring: Secondary | ICD-10-CM | POA: Diagnosis not present

## 2018-06-16 DIAGNOSIS — D899 Disorder involving the immune mechanism, unspecified: Secondary | ICD-10-CM | POA: Diagnosis not present

## 2018-06-16 DIAGNOSIS — Z944 Liver transplant status: Secondary | ICD-10-CM | POA: Diagnosis not present

## 2018-07-02 IMAGING — US US ABDOMEN LIMITED
1 series · 14 of 25 positions shown · non-contrast
Comparison: 03/14/2017

CLINICAL DATA: Cirrhosis

EXAM:
ULTRASOUND ABDOMEN LIMITED RIGHT UPPER QUADRANT

[Series 1: us abdomen limited · 0.30mm/px · 14 of 40 slices shown]
[im 1/40]
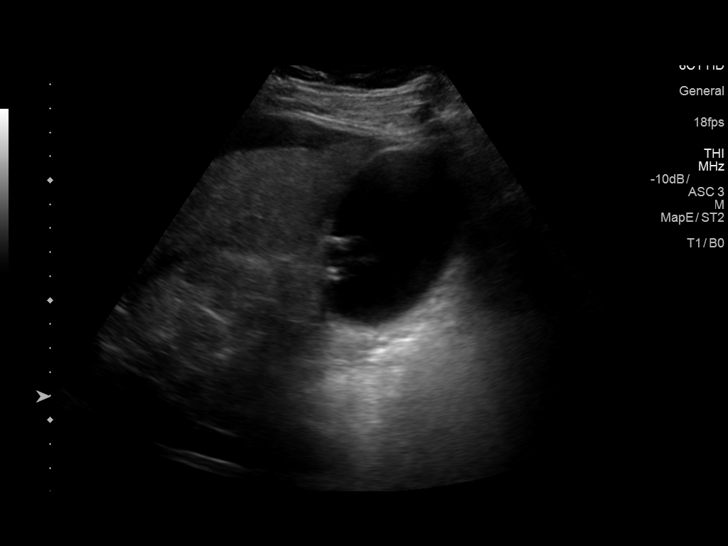
[im 4/40]
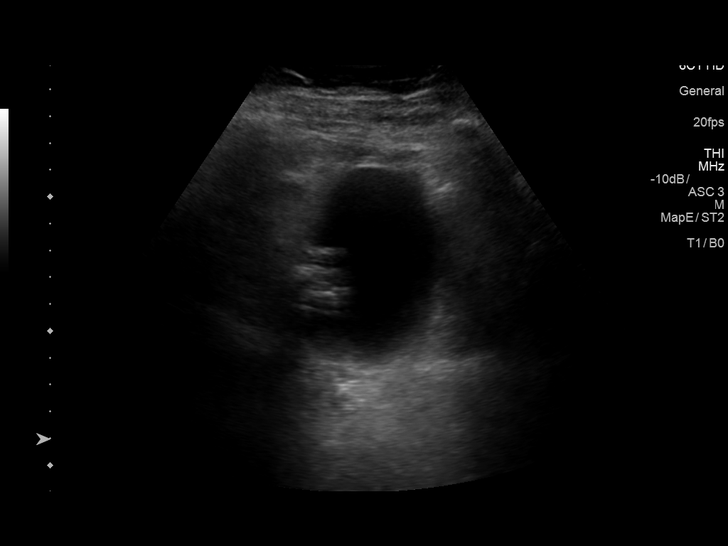
[im 7/40]
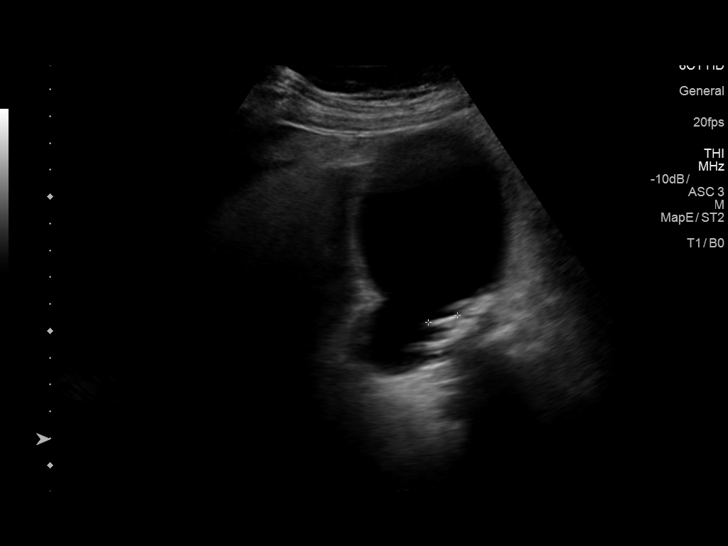
[im 10/40]
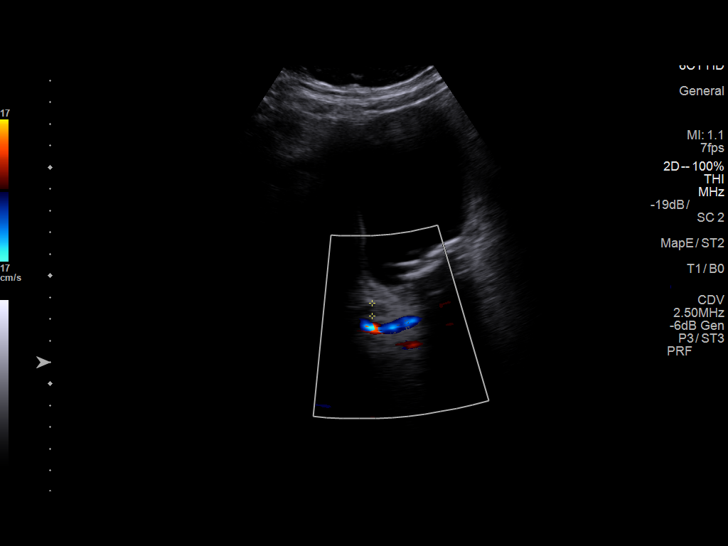
[im 14/40]
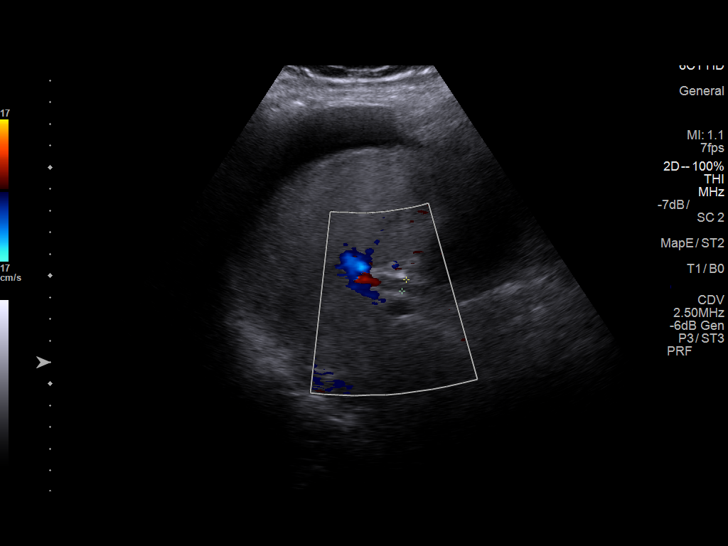
[im 15/40]
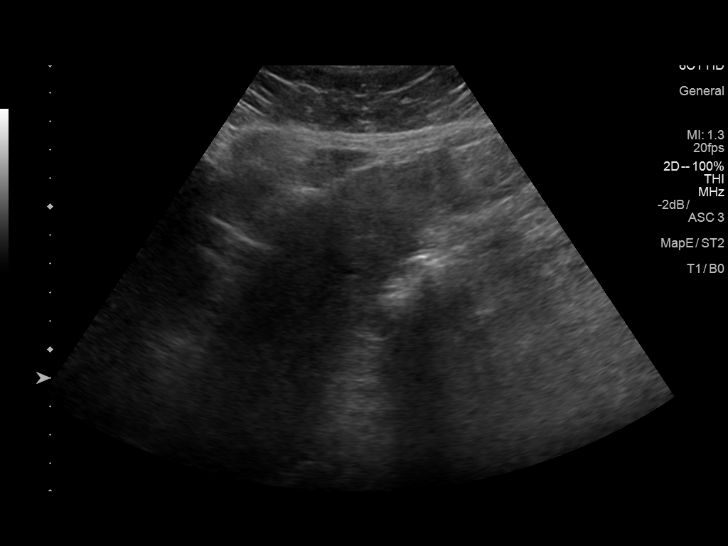
[im 18/40]
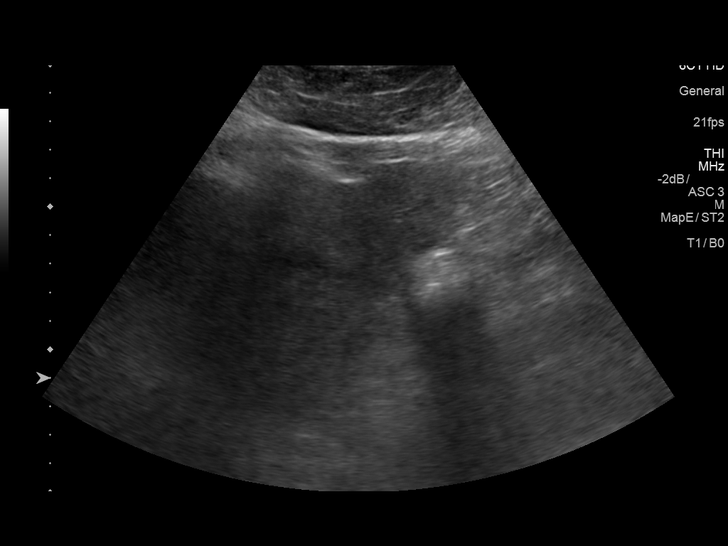
[im 22/40]
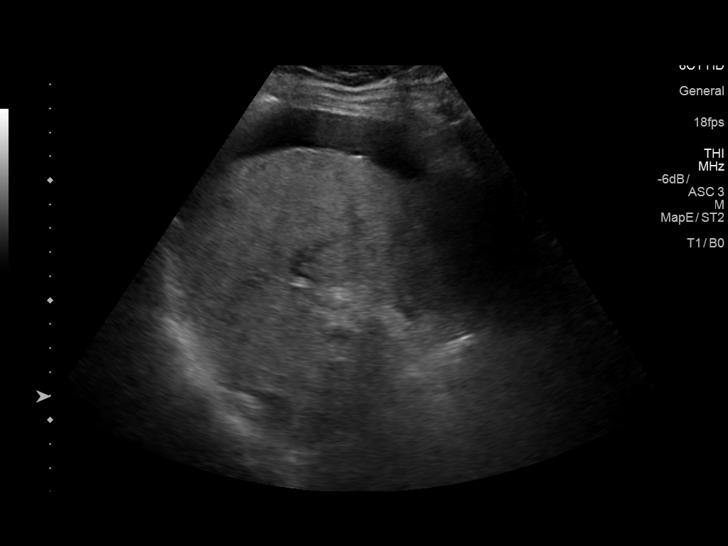
[im 25/40]
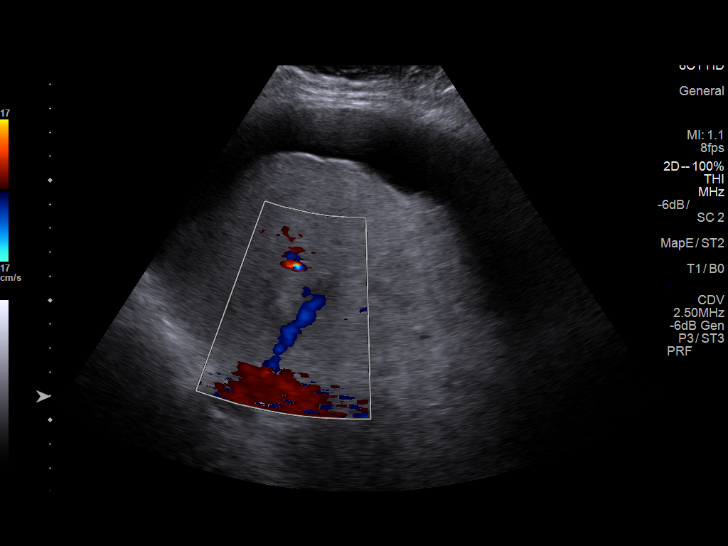
[im 27/40]
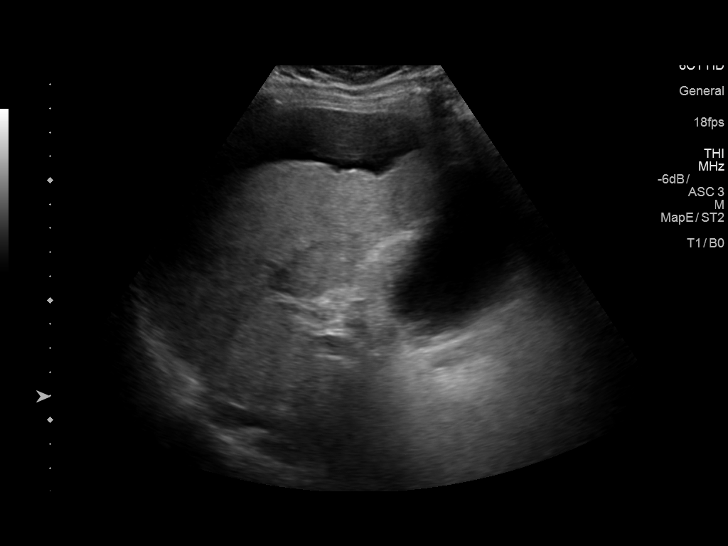
[im 30/40]
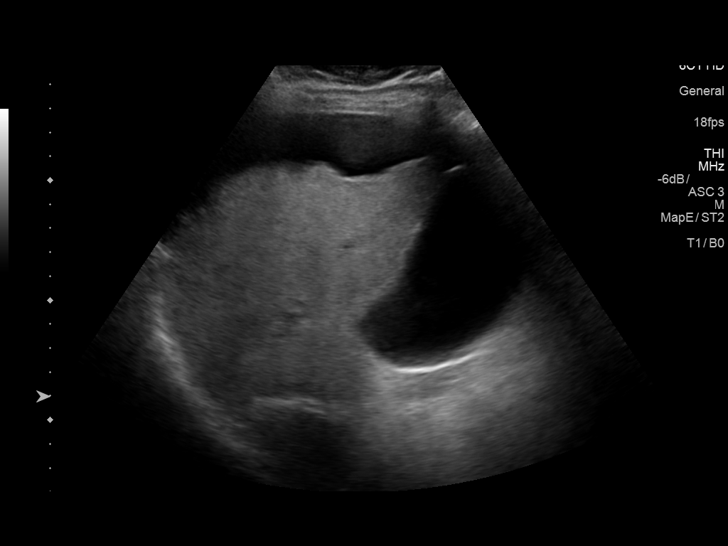
[im 33/40]
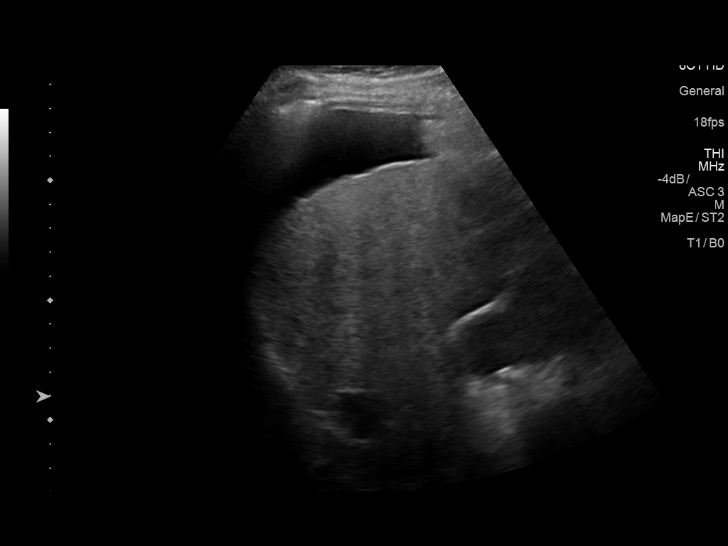
[im 36/40]
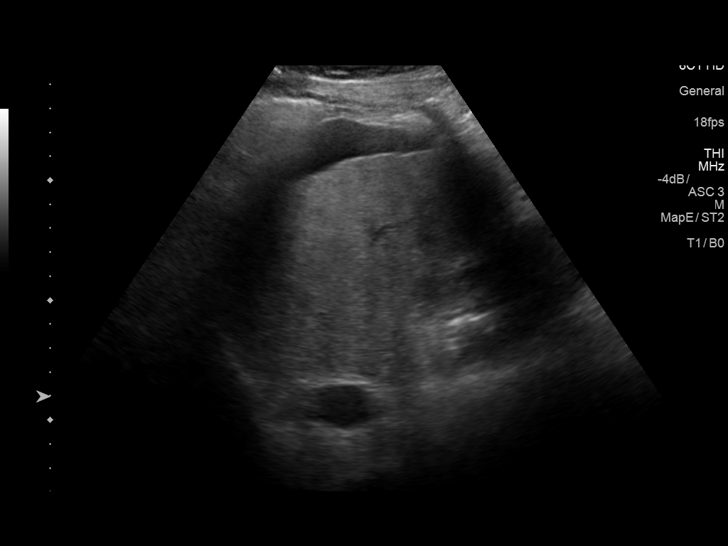
[im 40/40]
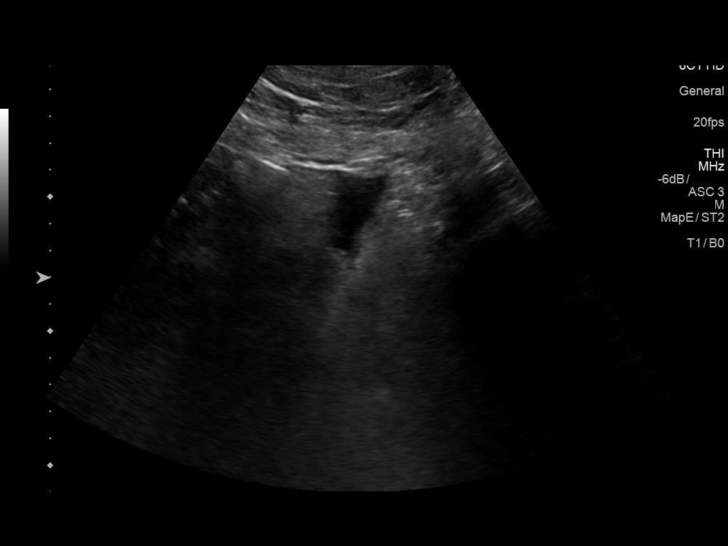

[14 of 25 positions shown; findings below may reference images not displayed]

FINDINGS: Gallbladder:

Cholelithiasis without gallbladder wall thickening or
pericholecystic fluid. Negative sonographic Murphy sign.

Common bile duct:

Diameter: 6 mm

Liver:

No focal hepatic mass. Liver is diminutive in size with a coarse
echotexture and a nodular contour consistent with cirrhosis. Small
volume perihepatic ascites. Portal vein is patent on color Doppler
imaging with normal direction of blood flow towards the liver.
IMPRESSION: 1. Cholelithiasis without sonographic evidence of acute
cholecystitis.
2. Cirrhosis.  No focal hepatic mass.
3. Small volume ascites.

## 2018-07-04 DIAGNOSIS — Z79899 Other long term (current) drug therapy: Secondary | ICD-10-CM | POA: Diagnosis not present

## 2018-07-04 DIAGNOSIS — E119 Type 2 diabetes mellitus without complications: Secondary | ICD-10-CM | POA: Diagnosis not present

## 2018-07-04 DIAGNOSIS — Z7901 Long term (current) use of anticoagulants: Secondary | ICD-10-CM | POA: Diagnosis not present

## 2018-07-04 DIAGNOSIS — R635 Abnormal weight gain: Secondary | ICD-10-CM | POA: Diagnosis not present

## 2018-07-04 DIAGNOSIS — Z944 Liver transplant status: Secondary | ICD-10-CM | POA: Diagnosis not present

## 2018-07-04 DIAGNOSIS — D899 Disorder involving the immune mechanism, unspecified: Secondary | ICD-10-CM | POA: Diagnosis not present

## 2018-07-04 DIAGNOSIS — I8393 Asymptomatic varicose veins of bilateral lower extremities: Secondary | ICD-10-CM | POA: Diagnosis not present

## 2018-07-04 DIAGNOSIS — Z794 Long term (current) use of insulin: Secondary | ICD-10-CM | POA: Diagnosis not present

## 2018-07-04 DIAGNOSIS — Z20828 Contact with and (suspected) exposure to other viral communicable diseases: Secondary | ICD-10-CM | POA: Diagnosis not present

## 2018-07-04 DIAGNOSIS — Z23 Encounter for immunization: Secondary | ICD-10-CM | POA: Diagnosis not present

## 2018-07-04 DIAGNOSIS — Z87891 Personal history of nicotine dependence: Secondary | ICD-10-CM | POA: Diagnosis not present

## 2018-07-04 DIAGNOSIS — Z4823 Encounter for aftercare following liver transplant: Secondary | ICD-10-CM | POA: Diagnosis not present

## 2018-07-04 DIAGNOSIS — Z86711 Personal history of pulmonary embolism: Secondary | ICD-10-CM | POA: Diagnosis not present

## 2018-07-04 DIAGNOSIS — Z86718 Personal history of other venous thrombosis and embolism: Secondary | ICD-10-CM | POA: Diagnosis not present

## 2018-07-05 DIAGNOSIS — T380X5A Adverse effect of glucocorticoids and synthetic analogues, initial encounter: Secondary | ICD-10-CM | POA: Diagnosis not present

## 2018-07-05 DIAGNOSIS — E0965 Drug or chemical induced diabetes mellitus with hyperglycemia: Secondary | ICD-10-CM | POA: Diagnosis not present

## 2018-07-05 DIAGNOSIS — E099 Drug or chemical induced diabetes mellitus without complications: Secondary | ICD-10-CM | POA: Diagnosis not present

## 2018-07-13 DIAGNOSIS — D899 Disorder involving the immune mechanism, unspecified: Secondary | ICD-10-CM | POA: Diagnosis not present

## 2018-07-13 DIAGNOSIS — Z944 Liver transplant status: Secondary | ICD-10-CM | POA: Diagnosis not present

## 2018-07-31 DIAGNOSIS — Z944 Liver transplant status: Secondary | ICD-10-CM | POA: Diagnosis not present

## 2018-07-31 DIAGNOSIS — D899 Disorder involving the immune mechanism, unspecified: Secondary | ICD-10-CM | POA: Diagnosis not present

## 2018-08-13 DIAGNOSIS — S6391XA Sprain of unspecified part of right wrist and hand, initial encounter: Secondary | ICD-10-CM | POA: Diagnosis not present

## 2018-08-13 DIAGNOSIS — R030 Elevated blood-pressure reading, without diagnosis of hypertension: Secondary | ICD-10-CM | POA: Diagnosis not present

## 2018-08-14 DIAGNOSIS — Z944 Liver transplant status: Secondary | ICD-10-CM | POA: Diagnosis not present

## 2018-08-14 DIAGNOSIS — D899 Disorder involving the immune mechanism, unspecified: Secondary | ICD-10-CM | POA: Diagnosis not present

## 2018-08-22 DIAGNOSIS — D899 Disorder involving the immune mechanism, unspecified: Secondary | ICD-10-CM | POA: Diagnosis not present

## 2018-08-22 DIAGNOSIS — Z944 Liver transplant status: Secondary | ICD-10-CM | POA: Diagnosis not present

## 2018-08-23 DIAGNOSIS — Z944 Liver transplant status: Secondary | ICD-10-CM | POA: Diagnosis not present

## 2018-08-29 DIAGNOSIS — K7689 Other specified diseases of liver: Secondary | ICD-10-CM | POA: Diagnosis not present

## 2018-08-29 DIAGNOSIS — R945 Abnormal results of liver function studies: Secondary | ICD-10-CM | POA: Diagnosis not present

## 2018-08-29 DIAGNOSIS — Z944 Liver transplant status: Secondary | ICD-10-CM | POA: Diagnosis not present

## 2018-08-29 DIAGNOSIS — T8649 Other complications of liver transplant: Secondary | ICD-10-CM | POA: Diagnosis not present

## 2018-09-04 DIAGNOSIS — D899 Disorder involving the immune mechanism, unspecified: Secondary | ICD-10-CM | POA: Diagnosis not present

## 2018-09-04 DIAGNOSIS — Z944 Liver transplant status: Secondary | ICD-10-CM | POA: Diagnosis not present

## 2018-09-15 DIAGNOSIS — R7989 Other specified abnormal findings of blood chemistry: Secondary | ICD-10-CM | POA: Diagnosis not present

## 2018-09-15 DIAGNOSIS — Z944 Liver transplant status: Secondary | ICD-10-CM | POA: Diagnosis not present

## 2018-09-15 DIAGNOSIS — T380X5A Adverse effect of glucocorticoids and synthetic analogues, initial encounter: Secondary | ICD-10-CM | POA: Diagnosis not present

## 2018-09-15 DIAGNOSIS — K7030 Alcoholic cirrhosis of liver without ascites: Secondary | ICD-10-CM | POA: Diagnosis not present

## 2018-09-15 DIAGNOSIS — R945 Abnormal results of liver function studies: Secondary | ICD-10-CM | POA: Diagnosis not present

## 2018-09-19 DIAGNOSIS — D899 Disorder involving the immune mechanism, unspecified: Secondary | ICD-10-CM | POA: Diagnosis not present

## 2018-09-19 DIAGNOSIS — Z944 Liver transplant status: Secondary | ICD-10-CM | POA: Diagnosis not present

## 2018-10-03 DIAGNOSIS — Z944 Liver transplant status: Secondary | ICD-10-CM | POA: Diagnosis not present

## 2018-10-03 DIAGNOSIS — Z5181 Encounter for therapeutic drug level monitoring: Secondary | ICD-10-CM | POA: Diagnosis not present

## 2018-10-03 DIAGNOSIS — Z7901 Long term (current) use of anticoagulants: Secondary | ICD-10-CM | POA: Diagnosis not present

## 2018-10-03 DIAGNOSIS — Z79899 Other long term (current) drug therapy: Secondary | ICD-10-CM | POA: Diagnosis not present

## 2018-10-03 DIAGNOSIS — R635 Abnormal weight gain: Secondary | ICD-10-CM | POA: Diagnosis not present

## 2018-10-03 DIAGNOSIS — R748 Abnormal levels of other serum enzymes: Secondary | ICD-10-CM | POA: Diagnosis not present

## 2018-10-03 DIAGNOSIS — Z86718 Personal history of other venous thrombosis and embolism: Secondary | ICD-10-CM | POA: Diagnosis not present

## 2018-10-03 DIAGNOSIS — D899 Disorder involving the immune mechanism, unspecified: Secondary | ICD-10-CM | POA: Diagnosis not present

## 2018-10-03 DIAGNOSIS — R42 Dizziness and giddiness: Secondary | ICD-10-CM | POA: Diagnosis not present

## 2018-10-03 DIAGNOSIS — Z87891 Personal history of nicotine dependence: Secondary | ICD-10-CM | POA: Diagnosis not present

## 2018-10-03 DIAGNOSIS — Z4823 Encounter for aftercare following liver transplant: Secondary | ICD-10-CM | POA: Diagnosis not present

## 2018-10-03 DIAGNOSIS — Z7952 Long term (current) use of systemic steroids: Secondary | ICD-10-CM | POA: Diagnosis not present

## 2018-10-03 DIAGNOSIS — K5900 Constipation, unspecified: Secondary | ICD-10-CM | POA: Diagnosis not present

## 2018-10-03 DIAGNOSIS — E119 Type 2 diabetes mellitus without complications: Secondary | ICD-10-CM | POA: Diagnosis not present

## 2018-10-03 DIAGNOSIS — Z86711 Personal history of pulmonary embolism: Secondary | ICD-10-CM | POA: Diagnosis not present

## 2018-10-05 DIAGNOSIS — Z5181 Encounter for therapeutic drug level monitoring: Secondary | ICD-10-CM | POA: Diagnosis not present

## 2018-10-05 DIAGNOSIS — K7030 Alcoholic cirrhosis of liver without ascites: Secondary | ICD-10-CM | POA: Diagnosis not present

## 2018-10-05 DIAGNOSIS — Z7901 Long term (current) use of anticoagulants: Secondary | ICD-10-CM | POA: Diagnosis not present

## 2018-10-05 DIAGNOSIS — Z86711 Personal history of pulmonary embolism: Secondary | ICD-10-CM | POA: Diagnosis not present

## 2018-10-05 DIAGNOSIS — Z86718 Personal history of other venous thrombosis and embolism: Secondary | ICD-10-CM | POA: Diagnosis not present

## 2018-10-05 DIAGNOSIS — Z944 Liver transplant status: Secondary | ICD-10-CM | POA: Diagnosis not present

## 2018-10-05 DIAGNOSIS — S7012XS Contusion of left thigh, sequela: Secondary | ICD-10-CM | POA: Diagnosis not present

## 2018-11-08 DIAGNOSIS — E099 Drug or chemical induced diabetes mellitus without complications: Secondary | ICD-10-CM | POA: Diagnosis not present

## 2018-11-08 DIAGNOSIS — T380X5A Adverse effect of glucocorticoids and synthetic analogues, initial encounter: Secondary | ICD-10-CM | POA: Diagnosis not present

## 2018-11-15 DIAGNOSIS — Z944 Liver transplant status: Secondary | ICD-10-CM | POA: Diagnosis not present

## 2018-11-15 DIAGNOSIS — D899 Disorder involving the immune mechanism, unspecified: Secondary | ICD-10-CM | POA: Diagnosis not present

## 2018-12-21 DIAGNOSIS — L821 Other seborrheic keratosis: Secondary | ICD-10-CM | POA: Diagnosis not present

## 2018-12-21 DIAGNOSIS — D225 Melanocytic nevi of trunk: Secondary | ICD-10-CM | POA: Diagnosis not present

## 2018-12-21 DIAGNOSIS — L814 Other melanin hyperpigmentation: Secondary | ICD-10-CM | POA: Diagnosis not present

## 2018-12-21 DIAGNOSIS — L918 Other hypertrophic disorders of the skin: Secondary | ICD-10-CM | POA: Diagnosis not present

## 2019-01-19 DIAGNOSIS — H9313 Tinnitus, bilateral: Secondary | ICD-10-CM | POA: Insufficient documentation

## 2019-01-19 DIAGNOSIS — Z0000 Encounter for general adult medical examination without abnormal findings: Secondary | ICD-10-CM | POA: Diagnosis not present

## 2019-03-01 DIAGNOSIS — D899 Disorder involving the immune mechanism, unspecified: Secondary | ICD-10-CM | POA: Diagnosis not present

## 2019-03-01 DIAGNOSIS — Z944 Liver transplant status: Secondary | ICD-10-CM | POA: Diagnosis not present

## 2019-03-01 DIAGNOSIS — Z298 Encounter for other specified prophylactic measures: Secondary | ICD-10-CM | POA: Diagnosis not present

## 2019-03-06 DIAGNOSIS — I10 Essential (primary) hypertension: Secondary | ICD-10-CM | POA: Diagnosis not present

## 2019-03-06 DIAGNOSIS — Z944 Liver transplant status: Secondary | ICD-10-CM | POA: Diagnosis not present

## 2019-03-28 DIAGNOSIS — Z298 Encounter for other specified prophylactic measures: Secondary | ICD-10-CM | POA: Diagnosis not present

## 2019-03-28 DIAGNOSIS — D899 Disorder involving the immune mechanism, unspecified: Secondary | ICD-10-CM | POA: Diagnosis not present

## 2019-03-28 DIAGNOSIS — Z944 Liver transplant status: Secondary | ICD-10-CM | POA: Diagnosis not present

## 2019-04-19 DIAGNOSIS — Z298 Encounter for other specified prophylactic measures: Secondary | ICD-10-CM | POA: Diagnosis not present

## 2019-04-19 DIAGNOSIS — D899 Disorder involving the immune mechanism, unspecified: Secondary | ICD-10-CM | POA: Diagnosis not present

## 2019-04-19 DIAGNOSIS — Z944 Liver transplant status: Secondary | ICD-10-CM | POA: Diagnosis not present

## 2019-04-23 DIAGNOSIS — Z944 Liver transplant status: Secondary | ICD-10-CM | POA: Diagnosis not present

## 2019-04-23 DIAGNOSIS — E875 Hyperkalemia: Secondary | ICD-10-CM | POA: Diagnosis not present

## 2019-04-27 DIAGNOSIS — Z7952 Long term (current) use of systemic steroids: Secondary | ICD-10-CM | POA: Diagnosis not present

## 2019-04-27 DIAGNOSIS — Z944 Liver transplant status: Secondary | ICD-10-CM | POA: Diagnosis not present

## 2019-05-10 DIAGNOSIS — Z298 Encounter for other specified prophylactic measures: Secondary | ICD-10-CM | POA: Diagnosis not present

## 2019-05-10 DIAGNOSIS — Z944 Liver transplant status: Secondary | ICD-10-CM | POA: Diagnosis not present

## 2019-05-10 DIAGNOSIS — D899 Disorder involving the immune mechanism, unspecified: Secondary | ICD-10-CM | POA: Diagnosis not present

## 2019-05-31 DIAGNOSIS — D899 Disorder involving the immune mechanism, unspecified: Secondary | ICD-10-CM | POA: Diagnosis not present

## 2019-05-31 DIAGNOSIS — Z298 Encounter for other specified prophylactic measures: Secondary | ICD-10-CM | POA: Diagnosis not present

## 2019-05-31 DIAGNOSIS — E785 Hyperlipidemia, unspecified: Secondary | ICD-10-CM | POA: Diagnosis not present

## 2019-05-31 DIAGNOSIS — Z944 Liver transplant status: Secondary | ICD-10-CM | POA: Diagnosis not present

## 2019-06-26 DIAGNOSIS — Z298 Encounter for other specified prophylactic measures: Secondary | ICD-10-CM | POA: Diagnosis not present

## 2019-06-26 DIAGNOSIS — R7989 Other specified abnormal findings of blood chemistry: Secondary | ICD-10-CM | POA: Diagnosis not present

## 2019-06-26 DIAGNOSIS — Z23 Encounter for immunization: Secondary | ICD-10-CM | POA: Diagnosis not present

## 2019-06-26 DIAGNOSIS — D225 Melanocytic nevi of trunk: Secondary | ICD-10-CM | POA: Diagnosis not present

## 2019-06-26 DIAGNOSIS — D849 Immunodeficiency, unspecified: Secondary | ICD-10-CM | POA: Diagnosis not present

## 2019-06-26 DIAGNOSIS — Z944 Liver transplant status: Secondary | ICD-10-CM | POA: Diagnosis not present

## 2019-06-26 DIAGNOSIS — L821 Other seborrheic keratosis: Secondary | ICD-10-CM | POA: Diagnosis not present

## 2019-06-26 DIAGNOSIS — L814 Other melanin hyperpigmentation: Secondary | ICD-10-CM | POA: Diagnosis not present

## 2019-07-03 DIAGNOSIS — Z298 Encounter for other specified prophylactic measures: Secondary | ICD-10-CM | POA: Diagnosis not present

## 2019-07-03 DIAGNOSIS — D849 Immunodeficiency, unspecified: Secondary | ICD-10-CM | POA: Diagnosis not present

## 2019-07-03 DIAGNOSIS — Z944 Liver transplant status: Secondary | ICD-10-CM | POA: Diagnosis not present

## 2019-07-13 DIAGNOSIS — D849 Immunodeficiency, unspecified: Secondary | ICD-10-CM | POA: Diagnosis not present

## 2019-07-13 DIAGNOSIS — Z944 Liver transplant status: Secondary | ICD-10-CM | POA: Diagnosis not present

## 2019-07-13 DIAGNOSIS — Z298 Encounter for other specified prophylactic measures: Secondary | ICD-10-CM | POA: Diagnosis not present

## 2019-08-08 DIAGNOSIS — Z944 Liver transplant status: Secondary | ICD-10-CM | POA: Diagnosis not present

## 2019-08-08 DIAGNOSIS — Z298 Encounter for other specified prophylactic measures: Secondary | ICD-10-CM | POA: Diagnosis not present

## 2019-08-08 DIAGNOSIS — D849 Immunodeficiency, unspecified: Secondary | ICD-10-CM | POA: Diagnosis not present

## 2019-08-27 DIAGNOSIS — Z298 Encounter for other specified prophylactic measures: Secondary | ICD-10-CM | POA: Diagnosis not present

## 2019-08-27 DIAGNOSIS — Z944 Liver transplant status: Secondary | ICD-10-CM | POA: Diagnosis not present

## 2019-08-27 DIAGNOSIS — D849 Immunodeficiency, unspecified: Secondary | ICD-10-CM | POA: Diagnosis not present

## 2019-09-04 DIAGNOSIS — D84821 Immunodeficiency due to drugs: Secondary | ICD-10-CM | POA: Diagnosis not present

## 2019-09-04 DIAGNOSIS — Z4823 Encounter for aftercare following liver transplant: Secondary | ICD-10-CM | POA: Diagnosis not present

## 2019-09-04 DIAGNOSIS — Z79899 Other long term (current) drug therapy: Secondary | ICD-10-CM | POA: Diagnosis not present

## 2019-09-04 DIAGNOSIS — D849 Immunodeficiency, unspecified: Secondary | ICD-10-CM | POA: Diagnosis not present

## 2019-09-04 DIAGNOSIS — I10 Essential (primary) hypertension: Secondary | ICD-10-CM | POA: Diagnosis not present

## 2019-09-04 DIAGNOSIS — Z944 Liver transplant status: Secondary | ICD-10-CM | POA: Diagnosis not present

## 2019-09-04 DIAGNOSIS — Z87891 Personal history of nicotine dependence: Secondary | ICD-10-CM | POA: Diagnosis not present

## 2019-09-04 DIAGNOSIS — Z23 Encounter for immunization: Secondary | ICD-10-CM | POA: Diagnosis not present

## 2019-10-13 IMAGING — MR MR HEAD W/O CM
9 of 11 series · 35 of 48 positions shown · non-contrast
Comparison: CT head 11/21/2017

CLINICAL DATA: Altered level of consciousness. Liver failure
cirrhosis

EXAM:
MRI HEAD WITHOUT CONTRAST
TECHNIQUE: Multiplanar, multiecho pulse sequences of the brain and surrounding
structures were obtained without intravenous contrast.

[Series 3: DWI · axial · 3.0mm · 1.09mm/px · z∈[-23,+123]mm · 9 of 100 slices shown (1 of 4)]
[im 1/100]
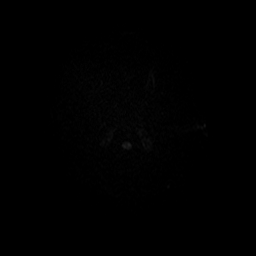
[im 13/100]
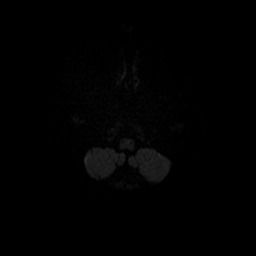
[im 25/100]
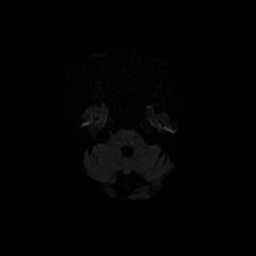
[im 38/100]
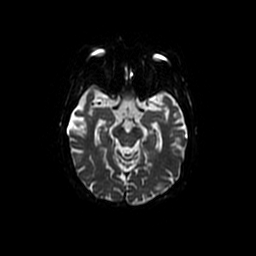
[im 50/100]
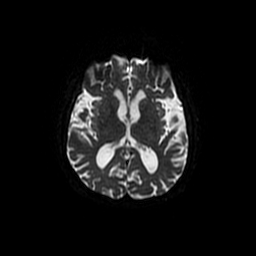
[im 62/100]
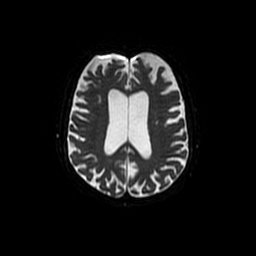
[im 75/100]
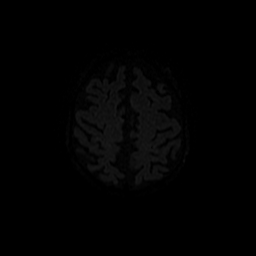
[im 87/100]
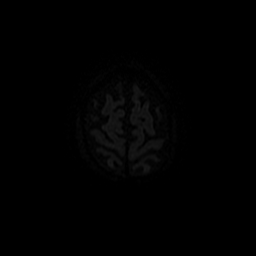
[im 100/100]
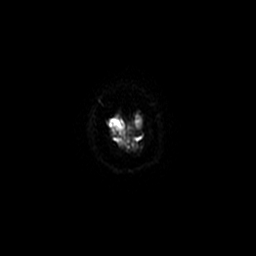

[Series 4: FLAIR · axial · 3.0mm · 0.43mm/px · z∈[-31,+125]mm · 3 of 27 slices shown]
[im 1/27]
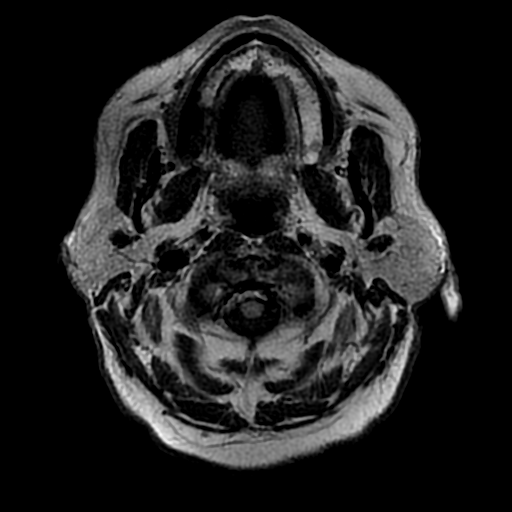
[im 14/27]
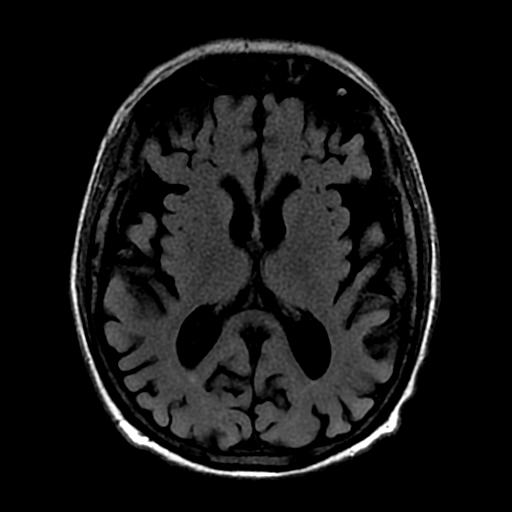
[im 27/27]
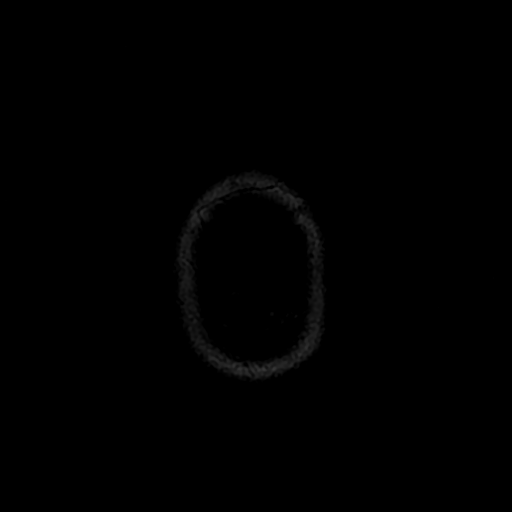

[Series 5: DWI · coronal · 5.0mm · 1.09mm/px · 6 of 68 slices shown (2 of 4)]
[im 1/68]
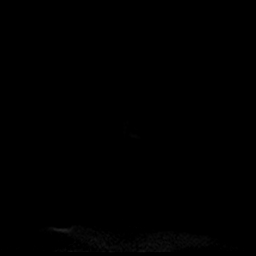
[im 14/68]
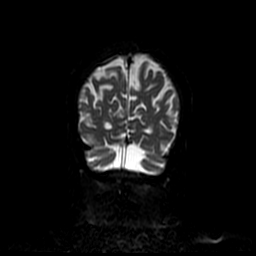
[im 27/68]
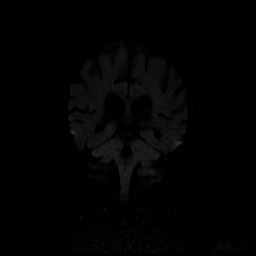
[im 41/68]
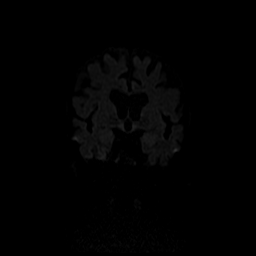
[im 54/68]
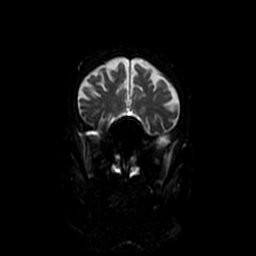
[im 68/68]
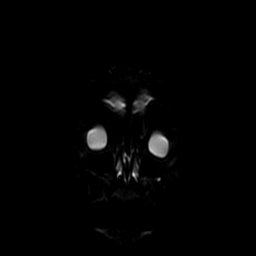

[Series 6: T1 · sagittal · 5.0mm · 0.47mm/px · 1 of 25 slices shown]
[im 1/25]
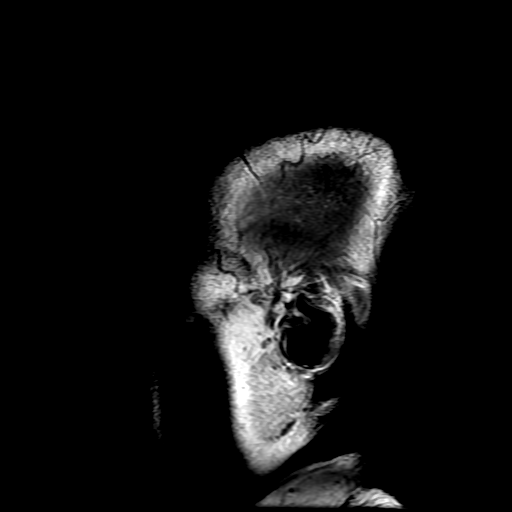

[Series 8: T2 · axial · 5.0mm · 0.43mm/px · z∈[-31,+125]mm · 3 of 27 slices shown (1 of 3)]
[im 1/27]
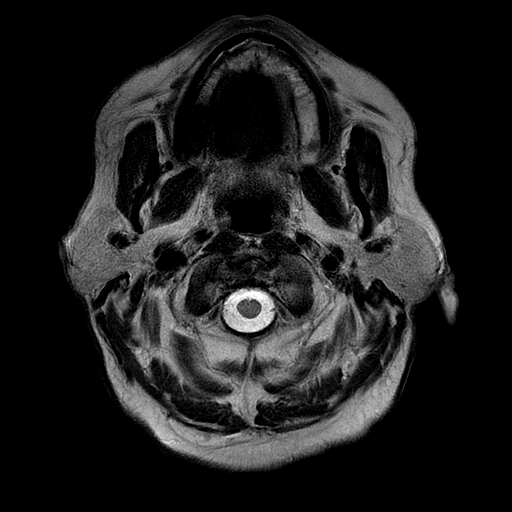
[im 14/27]
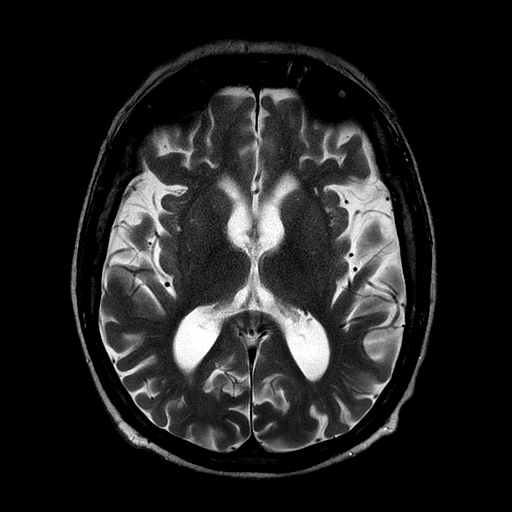
[im 27/27]
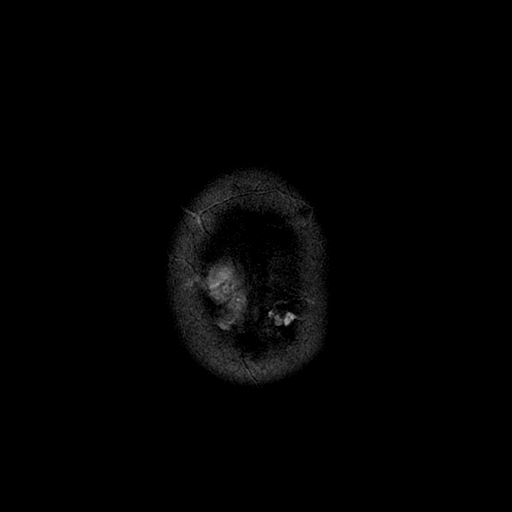

[Series 10: T2 · coronal · 5.0mm · 0.43mm/px · 3 of 29 slices shown (2 of 3)]
[im 1/29]
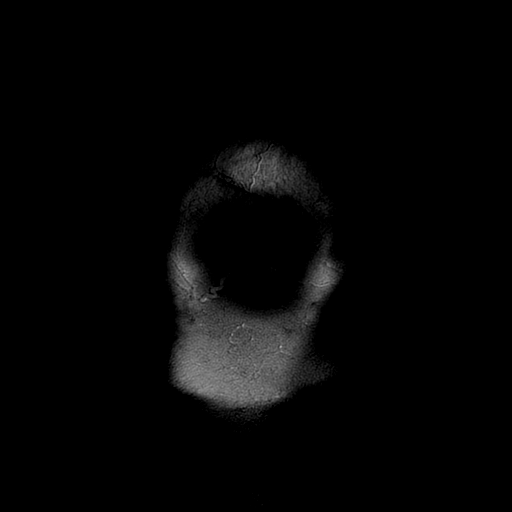
[im 15/29]
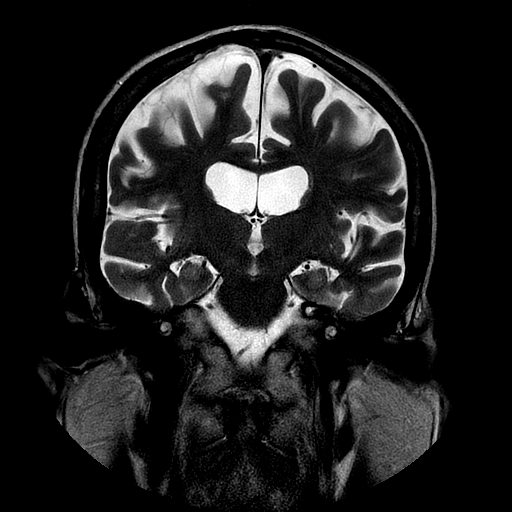
[im 29/29]
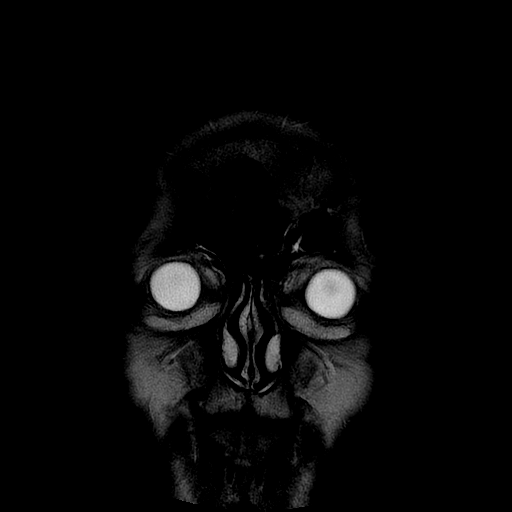

[Series 11: T2 · coronal · 3.0mm · 0.35mm/px · 2 of 24 slices shown (3 of 3)]
[im 1/24]
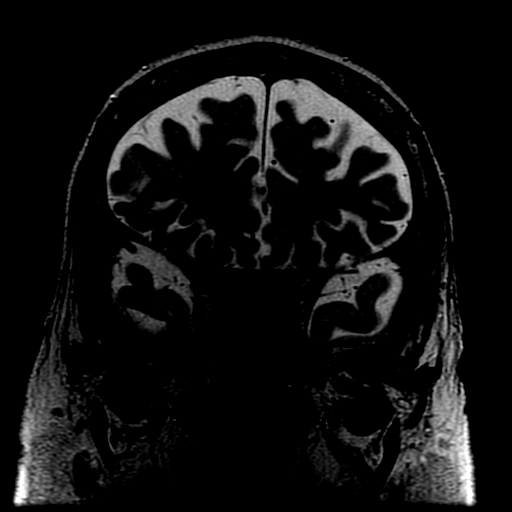
[im 24/24]
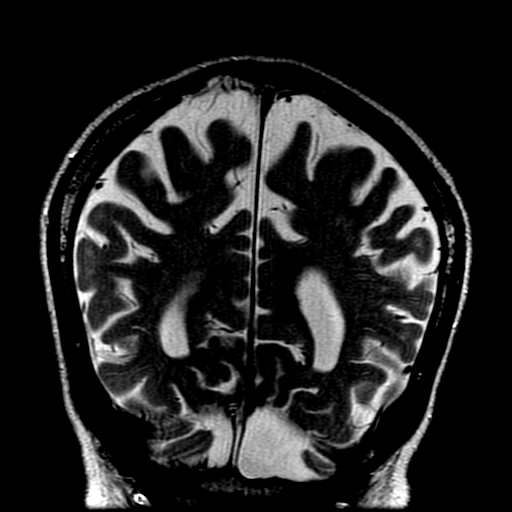

[Series 300: DWI · axial · 3.0mm · 1.09mm/px · z∈[-23,+123]mm · 5 of 50 slices shown (3 of 4)]
[im 1/50]
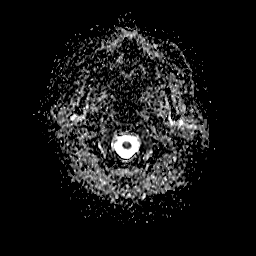
[im 13/50]
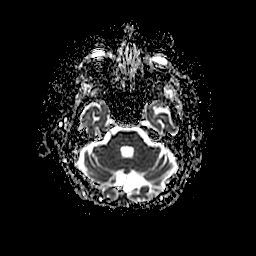
[im 25/50]
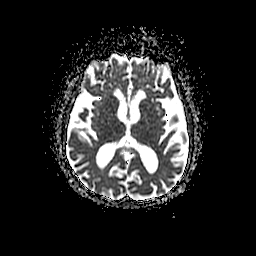
[im 37/50]
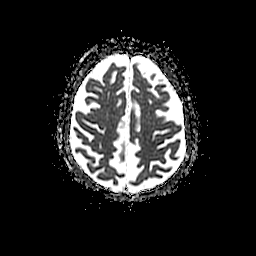
[im 50/50]
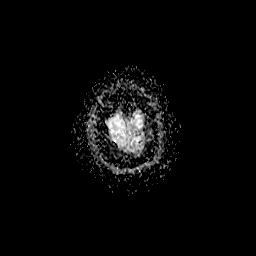

[Series 500: DWI · coronal · 5.0mm · 1.09mm/px · 3 of 34 slices shown (4 of 4)]
[im 1/34]
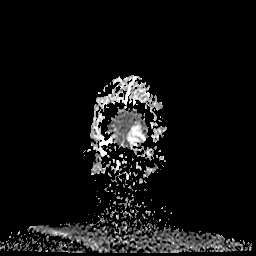
[im 17/34]
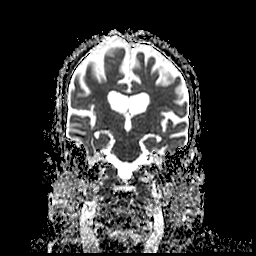
[im 34/34]
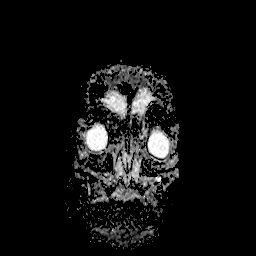

[35 of 48 positions shown; findings below may reference images not displayed]

FINDINGS: Brain: Moderate atrophy. Ventricular enlargement consistent with
atrophy. Negative for acute infarct. Minimal chronic changes in the
white matter. Negative for hemorrhage, mass, or fluid collection

Vascular: Normal arterial flow voids

Skull and upper cervical spine: Negative

Sinuses/Orbits: Negative

Other: None
IMPRESSION: Moderate atrophy.  No acute intracranial abnormality.

## 2019-12-07 DIAGNOSIS — Z944 Liver transplant status: Secondary | ICD-10-CM | POA: Diagnosis not present

## 2019-12-07 DIAGNOSIS — Z298 Encounter for other specified prophylactic measures: Secondary | ICD-10-CM | POA: Diagnosis not present

## 2019-12-07 DIAGNOSIS — D849 Immunodeficiency, unspecified: Secondary | ICD-10-CM | POA: Diagnosis not present

## 2019-12-20 DIAGNOSIS — R197 Diarrhea, unspecified: Secondary | ICD-10-CM | POA: Diagnosis not present

## 2019-12-21 DIAGNOSIS — Z298 Encounter for other specified prophylactic measures: Secondary | ICD-10-CM | POA: Diagnosis not present

## 2019-12-21 DIAGNOSIS — D849 Immunodeficiency, unspecified: Secondary | ICD-10-CM | POA: Diagnosis not present

## 2019-12-21 DIAGNOSIS — Z944 Liver transplant status: Secondary | ICD-10-CM | POA: Diagnosis not present

## 2019-12-25 DIAGNOSIS — D8489 Other immunodeficiencies: Secondary | ICD-10-CM | POA: Diagnosis not present

## 2019-12-25 DIAGNOSIS — E878 Other disorders of electrolyte and fluid balance, not elsewhere classified: Secondary | ICD-10-CM | POA: Diagnosis not present

## 2019-12-25 DIAGNOSIS — Z87891 Personal history of nicotine dependence: Secondary | ICD-10-CM | POA: Diagnosis not present

## 2019-12-25 DIAGNOSIS — Z79899 Other long term (current) drug therapy: Secondary | ICD-10-CM | POA: Diagnosis not present

## 2019-12-25 DIAGNOSIS — Z20822 Contact with and (suspected) exposure to covid-19: Secondary | ICD-10-CM | POA: Diagnosis not present

## 2019-12-25 DIAGNOSIS — K7030 Alcoholic cirrhosis of liver without ascites: Secondary | ICD-10-CM | POA: Diagnosis not present

## 2019-12-25 DIAGNOSIS — Z86718 Personal history of other venous thrombosis and embolism: Secondary | ICD-10-CM | POA: Diagnosis not present

## 2019-12-25 DIAGNOSIS — E875 Hyperkalemia: Secondary | ICD-10-CM | POA: Diagnosis not present

## 2019-12-25 DIAGNOSIS — Z792 Long term (current) use of antibiotics: Secondary | ICD-10-CM | POA: Diagnosis not present

## 2019-12-25 DIAGNOSIS — B258 Other cytomegaloviral diseases: Secondary | ICD-10-CM | POA: Diagnosis not present

## 2019-12-25 DIAGNOSIS — Z944 Liver transplant status: Secondary | ICD-10-CM | POA: Diagnosis not present

## 2019-12-25 DIAGNOSIS — B259 Cytomegaloviral disease, unspecified: Secondary | ICD-10-CM | POA: Diagnosis not present

## 2019-12-25 DIAGNOSIS — I951 Orthostatic hypotension: Secondary | ICD-10-CM | POA: Diagnosis not present

## 2019-12-25 DIAGNOSIS — Z86711 Personal history of pulmonary embolism: Secondary | ICD-10-CM | POA: Diagnosis not present

## 2019-12-25 DIAGNOSIS — R5383 Other fatigue: Secondary | ICD-10-CM | POA: Diagnosis not present

## 2019-12-25 DIAGNOSIS — D849 Immunodeficiency, unspecified: Secondary | ICD-10-CM | POA: Diagnosis not present

## 2019-12-25 DIAGNOSIS — R197 Diarrhea, unspecified: Secondary | ICD-10-CM | POA: Diagnosis not present

## 2019-12-25 DIAGNOSIS — I10 Essential (primary) hypertension: Secondary | ICD-10-CM | POA: Diagnosis not present

## 2019-12-25 DIAGNOSIS — R7989 Other specified abnormal findings of blood chemistry: Secondary | ICD-10-CM | POA: Diagnosis not present

## 2019-12-25 DIAGNOSIS — Z7901 Long term (current) use of anticoagulants: Secondary | ICD-10-CM | POA: Diagnosis not present

## 2019-12-26 DIAGNOSIS — Z86718 Personal history of other venous thrombosis and embolism: Secondary | ICD-10-CM | POA: Insufficient documentation

## 2019-12-26 DIAGNOSIS — E878 Other disorders of electrolyte and fluid balance, not elsewhere classified: Secondary | ICD-10-CM | POA: Insufficient documentation

## 2019-12-26 DIAGNOSIS — B259 Cytomegaloviral disease, unspecified: Secondary | ICD-10-CM | POA: Insufficient documentation

## 2019-12-26 DIAGNOSIS — R5383 Other fatigue: Secondary | ICD-10-CM | POA: Insufficient documentation

## 2019-12-26 DIAGNOSIS — R197 Diarrhea, unspecified: Secondary | ICD-10-CM | POA: Insufficient documentation

## 2019-12-29 DIAGNOSIS — K7030 Alcoholic cirrhosis of liver without ascites: Secondary | ICD-10-CM | POA: Diagnosis not present

## 2019-12-29 DIAGNOSIS — B259 Cytomegaloviral disease, unspecified: Secondary | ICD-10-CM | POA: Diagnosis not present

## 2019-12-31 DIAGNOSIS — Z298 Encounter for other specified prophylactic measures: Secondary | ICD-10-CM | POA: Diagnosis not present

## 2019-12-31 DIAGNOSIS — Z944 Liver transplant status: Secondary | ICD-10-CM | POA: Diagnosis not present

## 2019-12-31 DIAGNOSIS — D849 Immunodeficiency, unspecified: Secondary | ICD-10-CM | POA: Diagnosis not present

## 2020-01-02 DIAGNOSIS — K7030 Alcoholic cirrhosis of liver without ascites: Secondary | ICD-10-CM | POA: Diagnosis not present

## 2020-01-02 DIAGNOSIS — B259 Cytomegaloviral disease, unspecified: Secondary | ICD-10-CM | POA: Diagnosis not present

## 2020-01-07 DIAGNOSIS — D849 Immunodeficiency, unspecified: Secondary | ICD-10-CM | POA: Diagnosis not present

## 2020-01-07 DIAGNOSIS — Z298 Encounter for other specified prophylactic measures: Secondary | ICD-10-CM | POA: Diagnosis not present

## 2020-01-07 DIAGNOSIS — Z944 Liver transplant status: Secondary | ICD-10-CM | POA: Diagnosis not present

## 2020-01-09 DIAGNOSIS — K7030 Alcoholic cirrhosis of liver without ascites: Secondary | ICD-10-CM | POA: Diagnosis not present

## 2020-01-09 DIAGNOSIS — B259 Cytomegaloviral disease, unspecified: Secondary | ICD-10-CM | POA: Diagnosis not present

## 2020-01-14 DIAGNOSIS — R197 Diarrhea, unspecified: Secondary | ICD-10-CM | POA: Diagnosis not present

## 2020-01-14 DIAGNOSIS — R768 Other specified abnormal immunological findings in serum: Secondary | ICD-10-CM | POA: Diagnosis not present

## 2020-01-14 DIAGNOSIS — D849 Immunodeficiency, unspecified: Secondary | ICD-10-CM | POA: Diagnosis not present

## 2020-01-14 DIAGNOSIS — Z944 Liver transplant status: Secondary | ICD-10-CM | POA: Diagnosis not present

## 2020-01-16 DIAGNOSIS — K7030 Alcoholic cirrhosis of liver without ascites: Secondary | ICD-10-CM | POA: Diagnosis not present

## 2020-01-16 DIAGNOSIS — A0839 Other viral enteritis: Secondary | ICD-10-CM

## 2020-01-16 DIAGNOSIS — Z944 Liver transplant status: Secondary | ICD-10-CM | POA: Diagnosis not present

## 2020-01-16 DIAGNOSIS — B259 Cytomegaloviral disease, unspecified: Secondary | ICD-10-CM

## 2020-01-16 DIAGNOSIS — Z792 Long term (current) use of antibiotics: Secondary | ICD-10-CM | POA: Diagnosis not present

## 2020-01-16 HISTORY — DX: Other viral enteritis: A08.39

## 2020-01-21 DIAGNOSIS — D849 Immunodeficiency, unspecified: Secondary | ICD-10-CM | POA: Diagnosis not present

## 2020-01-21 DIAGNOSIS — R197 Diarrhea, unspecified: Secondary | ICD-10-CM | POA: Diagnosis not present

## 2020-01-21 DIAGNOSIS — R768 Other specified abnormal immunological findings in serum: Secondary | ICD-10-CM | POA: Diagnosis not present

## 2020-01-21 DIAGNOSIS — Z944 Liver transplant status: Secondary | ICD-10-CM | POA: Diagnosis not present

## 2020-01-23 DIAGNOSIS — K7030 Alcoholic cirrhosis of liver without ascites: Secondary | ICD-10-CM | POA: Diagnosis not present

## 2020-01-23 DIAGNOSIS — B259 Cytomegaloviral disease, unspecified: Secondary | ICD-10-CM | POA: Diagnosis not present

## 2020-01-26 DIAGNOSIS — K7030 Alcoholic cirrhosis of liver without ascites: Secondary | ICD-10-CM | POA: Diagnosis not present

## 2020-01-26 DIAGNOSIS — B259 Cytomegaloviral disease, unspecified: Secondary | ICD-10-CM | POA: Diagnosis not present

## 2020-01-29 DIAGNOSIS — R768 Other specified abnormal immunological findings in serum: Secondary | ICD-10-CM | POA: Diagnosis not present

## 2020-01-29 DIAGNOSIS — R197 Diarrhea, unspecified: Secondary | ICD-10-CM | POA: Diagnosis not present

## 2020-01-29 DIAGNOSIS — D849 Immunodeficiency, unspecified: Secondary | ICD-10-CM | POA: Diagnosis not present

## 2020-01-29 DIAGNOSIS — Z944 Liver transplant status: Secondary | ICD-10-CM | POA: Diagnosis not present

## 2020-01-30 DIAGNOSIS — K7030 Alcoholic cirrhosis of liver without ascites: Secondary | ICD-10-CM | POA: Diagnosis not present

## 2020-01-30 DIAGNOSIS — B259 Cytomegaloviral disease, unspecified: Secondary | ICD-10-CM | POA: Diagnosis not present

## 2020-02-04 DIAGNOSIS — R197 Diarrhea, unspecified: Secondary | ICD-10-CM | POA: Diagnosis not present

## 2020-02-04 DIAGNOSIS — D849 Immunodeficiency, unspecified: Secondary | ICD-10-CM | POA: Diagnosis not present

## 2020-02-04 DIAGNOSIS — R768 Other specified abnormal immunological findings in serum: Secondary | ICD-10-CM | POA: Diagnosis not present

## 2020-02-04 DIAGNOSIS — B259 Cytomegaloviral disease, unspecified: Secondary | ICD-10-CM | POA: Diagnosis not present

## 2020-02-04 DIAGNOSIS — Z944 Liver transplant status: Secondary | ICD-10-CM | POA: Diagnosis not present

## 2020-02-04 DIAGNOSIS — K7030 Alcoholic cirrhosis of liver without ascites: Secondary | ICD-10-CM | POA: Diagnosis not present

## 2020-02-11 DIAGNOSIS — R197 Diarrhea, unspecified: Secondary | ICD-10-CM | POA: Diagnosis not present

## 2020-02-11 DIAGNOSIS — D849 Immunodeficiency, unspecified: Secondary | ICD-10-CM | POA: Diagnosis not present

## 2020-02-11 DIAGNOSIS — Z944 Liver transplant status: Secondary | ICD-10-CM | POA: Diagnosis not present

## 2020-02-11 DIAGNOSIS — R768 Other specified abnormal immunological findings in serum: Secondary | ICD-10-CM | POA: Diagnosis not present

## 2020-02-18 DIAGNOSIS — Z944 Liver transplant status: Secondary | ICD-10-CM | POA: Diagnosis not present

## 2020-02-18 DIAGNOSIS — D849 Immunodeficiency, unspecified: Secondary | ICD-10-CM | POA: Diagnosis not present

## 2020-02-18 DIAGNOSIS — R197 Diarrhea, unspecified: Secondary | ICD-10-CM | POA: Diagnosis not present

## 2020-02-18 DIAGNOSIS — R768 Other specified abnormal immunological findings in serum: Secondary | ICD-10-CM | POA: Diagnosis not present

## 2020-02-25 DIAGNOSIS — Z944 Liver transplant status: Secondary | ICD-10-CM | POA: Diagnosis not present

## 2020-02-25 DIAGNOSIS — D849 Immunodeficiency, unspecified: Secondary | ICD-10-CM | POA: Diagnosis not present

## 2020-02-25 DIAGNOSIS — R768 Other specified abnormal immunological findings in serum: Secondary | ICD-10-CM | POA: Diagnosis not present

## 2020-02-25 DIAGNOSIS — R197 Diarrhea, unspecified: Secondary | ICD-10-CM | POA: Diagnosis not present

## 2020-03-04 DIAGNOSIS — D849 Immunodeficiency, unspecified: Secondary | ICD-10-CM | POA: Diagnosis not present

## 2020-03-04 DIAGNOSIS — A0839 Other viral enteritis: Secondary | ICD-10-CM | POA: Diagnosis not present

## 2020-03-04 DIAGNOSIS — B259 Cytomegaloviral disease, unspecified: Secondary | ICD-10-CM | POA: Diagnosis not present

## 2020-03-04 DIAGNOSIS — Z944 Liver transplant status: Secondary | ICD-10-CM | POA: Diagnosis not present

## 2020-03-07 DIAGNOSIS — R768 Other specified abnormal immunological findings in serum: Secondary | ICD-10-CM | POA: Diagnosis not present

## 2020-03-07 DIAGNOSIS — D849 Immunodeficiency, unspecified: Secondary | ICD-10-CM | POA: Diagnosis not present

## 2020-03-07 DIAGNOSIS — R197 Diarrhea, unspecified: Secondary | ICD-10-CM | POA: Diagnosis not present

## 2020-03-07 DIAGNOSIS — Z944 Liver transplant status: Secondary | ICD-10-CM | POA: Diagnosis not present

## 2020-04-08 DIAGNOSIS — Z944 Liver transplant status: Secondary | ICD-10-CM | POA: Diagnosis not present

## 2020-04-08 DIAGNOSIS — R768 Other specified abnormal immunological findings in serum: Secondary | ICD-10-CM | POA: Diagnosis not present

## 2020-04-08 DIAGNOSIS — R197 Diarrhea, unspecified: Secondary | ICD-10-CM | POA: Diagnosis not present

## 2020-04-08 DIAGNOSIS — D849 Immunodeficiency, unspecified: Secondary | ICD-10-CM | POA: Diagnosis not present

## 2020-04-28 DIAGNOSIS — Z944 Liver transplant status: Secondary | ICD-10-CM | POA: Diagnosis not present

## 2020-04-28 DIAGNOSIS — D849 Immunodeficiency, unspecified: Secondary | ICD-10-CM | POA: Diagnosis not present

## 2020-04-28 DIAGNOSIS — R197 Diarrhea, unspecified: Secondary | ICD-10-CM | POA: Diagnosis not present

## 2020-04-28 DIAGNOSIS — R768 Other specified abnormal immunological findings in serum: Secondary | ICD-10-CM | POA: Diagnosis not present

## 2020-05-07 DIAGNOSIS — Z09 Encounter for follow-up examination after completed treatment for conditions other than malignant neoplasm: Secondary | ICD-10-CM | POA: Diagnosis not present

## 2020-05-07 DIAGNOSIS — D84821 Immunodeficiency due to drugs: Secondary | ICD-10-CM | POA: Diagnosis not present

## 2020-05-07 DIAGNOSIS — D849 Immunodeficiency, unspecified: Secondary | ICD-10-CM | POA: Diagnosis not present

## 2020-05-07 DIAGNOSIS — I10 Essential (primary) hypertension: Secondary | ICD-10-CM | POA: Diagnosis not present

## 2020-05-07 DIAGNOSIS — Z944 Liver transplant status: Secondary | ICD-10-CM | POA: Diagnosis not present

## 2020-05-07 DIAGNOSIS — Z86711 Personal history of pulmonary embolism: Secondary | ICD-10-CM | POA: Diagnosis not present

## 2020-05-07 DIAGNOSIS — Z86718 Personal history of other venous thrombosis and embolism: Secondary | ICD-10-CM | POA: Diagnosis not present

## 2020-05-07 DIAGNOSIS — Z79899 Other long term (current) drug therapy: Secondary | ICD-10-CM | POA: Diagnosis not present

## 2020-05-07 DIAGNOSIS — Z8619 Personal history of other infectious and parasitic diseases: Secondary | ICD-10-CM | POA: Diagnosis not present

## 2020-05-07 DIAGNOSIS — Z87891 Personal history of nicotine dependence: Secondary | ICD-10-CM | POA: Diagnosis not present

## 2020-05-07 DIAGNOSIS — Z4823 Encounter for aftercare following liver transplant: Secondary | ICD-10-CM | POA: Diagnosis not present

## 2020-05-07 DIAGNOSIS — Z792 Long term (current) use of antibiotics: Secondary | ICD-10-CM | POA: Diagnosis not present

## 2020-05-07 DIAGNOSIS — B259 Cytomegaloviral disease, unspecified: Secondary | ICD-10-CM | POA: Diagnosis not present

## 2020-05-07 DIAGNOSIS — A0839 Other viral enteritis: Secondary | ICD-10-CM | POA: Diagnosis not present

## 2020-05-26 DIAGNOSIS — D849 Immunodeficiency, unspecified: Secondary | ICD-10-CM | POA: Diagnosis not present

## 2020-05-26 DIAGNOSIS — R768 Other specified abnormal immunological findings in serum: Secondary | ICD-10-CM | POA: Diagnosis not present

## 2020-05-26 DIAGNOSIS — R197 Diarrhea, unspecified: Secondary | ICD-10-CM | POA: Diagnosis not present

## 2020-05-26 DIAGNOSIS — Z944 Liver transplant status: Secondary | ICD-10-CM | POA: Diagnosis not present

## 2020-06-09 DIAGNOSIS — R197 Diarrhea, unspecified: Secondary | ICD-10-CM | POA: Diagnosis not present

## 2020-06-09 DIAGNOSIS — D849 Immunodeficiency, unspecified: Secondary | ICD-10-CM | POA: Diagnosis not present

## 2020-06-09 DIAGNOSIS — R768 Other specified abnormal immunological findings in serum: Secondary | ICD-10-CM | POA: Diagnosis not present

## 2020-06-09 DIAGNOSIS — Z944 Liver transplant status: Secondary | ICD-10-CM | POA: Diagnosis not present

## 2020-06-24 DIAGNOSIS — Z0000 Encounter for general adult medical examination without abnormal findings: Secondary | ICD-10-CM | POA: Diagnosis not present

## 2020-06-24 DIAGNOSIS — Z125 Encounter for screening for malignant neoplasm of prostate: Secondary | ICD-10-CM | POA: Diagnosis not present

## 2020-06-24 DIAGNOSIS — I10 Essential (primary) hypertension: Secondary | ICD-10-CM | POA: Diagnosis not present

## 2020-06-24 DIAGNOSIS — R739 Hyperglycemia, unspecified: Secondary | ICD-10-CM | POA: Diagnosis not present

## 2020-06-24 DIAGNOSIS — A0839 Other viral enteritis: Secondary | ICD-10-CM | POA: Diagnosis not present

## 2020-06-24 DIAGNOSIS — I4949 Other premature depolarization: Secondary | ICD-10-CM | POA: Diagnosis not present

## 2020-06-24 DIAGNOSIS — E8801 Alpha-1-antitrypsin deficiency: Secondary | ICD-10-CM

## 2020-06-24 DIAGNOSIS — D696 Thrombocytopenia, unspecified: Secondary | ICD-10-CM | POA: Diagnosis not present

## 2020-06-24 DIAGNOSIS — B259 Cytomegaloviral disease, unspecified: Secondary | ICD-10-CM | POA: Diagnosis not present

## 2020-06-24 DIAGNOSIS — Z944 Liver transplant status: Secondary | ICD-10-CM | POA: Diagnosis not present

## 2020-06-24 HISTORY — DX: Alpha-1-antitrypsin deficiency: E88.01

## 2020-06-26 DIAGNOSIS — R001 Bradycardia, unspecified: Secondary | ICD-10-CM | POA: Diagnosis not present

## 2020-06-26 DIAGNOSIS — I440 Atrioventricular block, first degree: Secondary | ICD-10-CM | POA: Diagnosis not present

## 2020-07-07 DIAGNOSIS — Z944 Liver transplant status: Secondary | ICD-10-CM | POA: Diagnosis not present

## 2020-07-07 DIAGNOSIS — R197 Diarrhea, unspecified: Secondary | ICD-10-CM | POA: Diagnosis not present

## 2020-07-07 DIAGNOSIS — R768 Other specified abnormal immunological findings in serum: Secondary | ICD-10-CM | POA: Diagnosis not present

## 2020-07-07 DIAGNOSIS — D849 Immunodeficiency, unspecified: Secondary | ICD-10-CM | POA: Diagnosis not present

## 2020-07-21 DIAGNOSIS — Z944 Liver transplant status: Secondary | ICD-10-CM | POA: Diagnosis not present

## 2020-07-21 DIAGNOSIS — R768 Other specified abnormal immunological findings in serum: Secondary | ICD-10-CM | POA: Diagnosis not present

## 2020-07-21 DIAGNOSIS — R197 Diarrhea, unspecified: Secondary | ICD-10-CM | POA: Diagnosis not present

## 2020-07-21 DIAGNOSIS — D849 Immunodeficiency, unspecified: Secondary | ICD-10-CM | POA: Diagnosis not present

## 2020-09-10 DIAGNOSIS — A0839 Other viral enteritis: Secondary | ICD-10-CM | POA: Diagnosis not present

## 2020-09-10 DIAGNOSIS — I10 Essential (primary) hypertension: Secondary | ICD-10-CM | POA: Diagnosis not present

## 2020-09-10 DIAGNOSIS — Z944 Liver transplant status: Secondary | ICD-10-CM | POA: Diagnosis not present

## 2020-09-10 DIAGNOSIS — D849 Immunodeficiency, unspecified: Secondary | ICD-10-CM | POA: Diagnosis not present

## 2020-09-10 DIAGNOSIS — B259 Cytomegaloviral disease, unspecified: Secondary | ICD-10-CM | POA: Diagnosis not present

## 2020-09-10 DIAGNOSIS — Z792 Long term (current) use of antibiotics: Secondary | ICD-10-CM | POA: Diagnosis not present

## 2020-09-30 DIAGNOSIS — D849 Immunodeficiency, unspecified: Secondary | ICD-10-CM | POA: Diagnosis not present

## 2020-09-30 DIAGNOSIS — B259 Cytomegaloviral disease, unspecified: Secondary | ICD-10-CM | POA: Diagnosis not present

## 2020-09-30 DIAGNOSIS — Z944 Liver transplant status: Secondary | ICD-10-CM | POA: Diagnosis not present

## 2020-11-17 ENCOUNTER — Other Ambulatory Visit (HOSPITAL_COMMUNITY): Payer: Self-pay

## 2020-12-05 DIAGNOSIS — R197 Diarrhea, unspecified: Secondary | ICD-10-CM | POA: Diagnosis not present

## 2020-12-05 DIAGNOSIS — Z944 Liver transplant status: Secondary | ICD-10-CM | POA: Diagnosis not present

## 2020-12-05 DIAGNOSIS — D849 Immunodeficiency, unspecified: Secondary | ICD-10-CM | POA: Diagnosis not present

## 2020-12-05 DIAGNOSIS — R768 Other specified abnormal immunological findings in serum: Secondary | ICD-10-CM | POA: Diagnosis not present

## 2020-12-17 DIAGNOSIS — Z944 Liver transplant status: Secondary | ICD-10-CM | POA: Diagnosis not present

## 2020-12-17 DIAGNOSIS — Z1211 Encounter for screening for malignant neoplasm of colon: Secondary | ICD-10-CM | POA: Diagnosis not present

## 2020-12-17 DIAGNOSIS — I10 Essential (primary) hypertension: Secondary | ICD-10-CM | POA: Diagnosis not present

## 2021-01-05 DIAGNOSIS — Z1211 Encounter for screening for malignant neoplasm of colon: Secondary | ICD-10-CM | POA: Diagnosis not present

## 2021-01-07 DIAGNOSIS — Z944 Liver transplant status: Secondary | ICD-10-CM | POA: Diagnosis not present

## 2021-01-07 DIAGNOSIS — D849 Immunodeficiency, unspecified: Secondary | ICD-10-CM | POA: Diagnosis not present

## 2021-01-07 DIAGNOSIS — Z792 Long term (current) use of antibiotics: Secondary | ICD-10-CM | POA: Diagnosis not present

## 2021-01-07 DIAGNOSIS — B259 Cytomegaloviral disease, unspecified: Secondary | ICD-10-CM | POA: Diagnosis not present

## 2021-01-07 DIAGNOSIS — A0839 Other viral enteritis: Secondary | ICD-10-CM | POA: Diagnosis not present

## 2021-02-02 DIAGNOSIS — B259 Cytomegaloviral disease, unspecified: Secondary | ICD-10-CM | POA: Diagnosis not present

## 2021-02-02 DIAGNOSIS — D849 Immunodeficiency, unspecified: Secondary | ICD-10-CM | POA: Diagnosis not present

## 2021-02-02 DIAGNOSIS — Z944 Liver transplant status: Secondary | ICD-10-CM | POA: Diagnosis not present

## 2021-03-23 DIAGNOSIS — Z944 Liver transplant status: Secondary | ICD-10-CM | POA: Diagnosis not present

## 2021-03-23 DIAGNOSIS — D849 Immunodeficiency, unspecified: Secondary | ICD-10-CM | POA: Diagnosis not present

## 2021-03-23 DIAGNOSIS — B259 Cytomegaloviral disease, unspecified: Secondary | ICD-10-CM | POA: Diagnosis not present

## 2021-04-07 DIAGNOSIS — R197 Diarrhea, unspecified: Secondary | ICD-10-CM | POA: Diagnosis not present

## 2021-04-07 DIAGNOSIS — Z4823 Encounter for aftercare following liver transplant: Secondary | ICD-10-CM | POA: Diagnosis not present

## 2021-04-07 DIAGNOSIS — Z79899 Other long term (current) drug therapy: Secondary | ICD-10-CM | POA: Diagnosis not present

## 2021-04-07 DIAGNOSIS — Z23 Encounter for immunization: Secondary | ICD-10-CM | POA: Diagnosis not present

## 2021-04-07 DIAGNOSIS — Z87891 Personal history of nicotine dependence: Secondary | ICD-10-CM | POA: Diagnosis not present

## 2021-04-07 DIAGNOSIS — R030 Elevated blood-pressure reading, without diagnosis of hypertension: Secondary | ICD-10-CM | POA: Diagnosis not present

## 2021-04-07 DIAGNOSIS — D84821 Immunodeficiency due to drugs: Secondary | ICD-10-CM | POA: Diagnosis not present

## 2021-04-20 DIAGNOSIS — B259 Cytomegaloviral disease, unspecified: Secondary | ICD-10-CM | POA: Diagnosis not present

## 2021-04-20 DIAGNOSIS — Z944 Liver transplant status: Secondary | ICD-10-CM | POA: Diagnosis not present

## 2021-05-25 DIAGNOSIS — D849 Immunodeficiency, unspecified: Secondary | ICD-10-CM | POA: Diagnosis not present

## 2021-05-25 DIAGNOSIS — Z944 Liver transplant status: Secondary | ICD-10-CM | POA: Diagnosis not present

## 2021-05-25 DIAGNOSIS — B259 Cytomegaloviral disease, unspecified: Secondary | ICD-10-CM | POA: Diagnosis not present

## 2021-06-22 DIAGNOSIS — D849 Immunodeficiency, unspecified: Secondary | ICD-10-CM | POA: Diagnosis not present

## 2021-06-22 DIAGNOSIS — Z944 Liver transplant status: Secondary | ICD-10-CM | POA: Diagnosis not present

## 2021-06-25 DIAGNOSIS — N5089 Other specified disorders of the male genital organs: Secondary | ICD-10-CM | POA: Diagnosis not present

## 2021-06-25 DIAGNOSIS — Z125 Encounter for screening for malignant neoplasm of prostate: Secondary | ICD-10-CM | POA: Diagnosis not present

## 2021-06-25 DIAGNOSIS — I10 Essential (primary) hypertension: Secondary | ICD-10-CM | POA: Diagnosis not present

## 2021-06-25 DIAGNOSIS — Z0000 Encounter for general adult medical examination without abnormal findings: Secondary | ICD-10-CM | POA: Diagnosis not present

## 2021-06-25 DIAGNOSIS — Z79899 Other long term (current) drug therapy: Secondary | ICD-10-CM | POA: Diagnosis not present

## 2021-06-25 DIAGNOSIS — Z1211 Encounter for screening for malignant neoplasm of colon: Secondary | ICD-10-CM | POA: Diagnosis not present

## 2021-06-25 DIAGNOSIS — Z13220 Encounter for screening for lipoid disorders: Secondary | ICD-10-CM | POA: Diagnosis not present

## 2021-06-25 DIAGNOSIS — I8393 Asymptomatic varicose veins of bilateral lower extremities: Secondary | ICD-10-CM | POA: Diagnosis not present

## 2021-06-25 DIAGNOSIS — R946 Abnormal results of thyroid function studies: Secondary | ICD-10-CM | POA: Diagnosis not present

## 2021-06-25 DIAGNOSIS — Z944 Liver transplant status: Secondary | ICD-10-CM | POA: Diagnosis not present

## 2021-06-25 DIAGNOSIS — E875 Hyperkalemia: Secondary | ICD-10-CM | POA: Diagnosis not present

## 2021-07-10 DIAGNOSIS — I861 Scrotal varices: Secondary | ICD-10-CM | POA: Diagnosis not present

## 2021-07-10 DIAGNOSIS — N430 Encysted hydrocele: Secondary | ICD-10-CM | POA: Diagnosis not present

## 2022-09-11 ENCOUNTER — Encounter (HOSPITAL_BASED_OUTPATIENT_CLINIC_OR_DEPARTMENT_OTHER): Payer: Self-pay | Admitting: Emergency Medicine

## 2022-09-11 ENCOUNTER — Other Ambulatory Visit: Payer: Self-pay

## 2022-09-11 ENCOUNTER — Emergency Department (HOSPITAL_BASED_OUTPATIENT_CLINIC_OR_DEPARTMENT_OTHER)
Admission: EM | Admit: 2022-09-11 | Discharge: 2022-09-11 | Disposition: A | Discharge status: Home / Self Care | Payer: PPO | Attending: Emergency Medicine | Admitting: Emergency Medicine

## 2022-09-11 ENCOUNTER — Emergency Department (HOSPITAL_BASED_OUTPATIENT_CLINIC_OR_DEPARTMENT_OTHER): Payer: PPO

## 2022-09-11 DIAGNOSIS — Z1152 Encounter for screening for COVID-19: Secondary | ICD-10-CM | POA: Diagnosis not present

## 2022-09-11 DIAGNOSIS — I4891 Unspecified atrial fibrillation: Secondary | ICD-10-CM | POA: Diagnosis not present

## 2022-09-11 DIAGNOSIS — R0602 Shortness of breath: Secondary | ICD-10-CM | POA: Insufficient documentation

## 2022-09-11 DIAGNOSIS — R059 Cough, unspecified: Secondary | ICD-10-CM | POA: Diagnosis present

## 2022-09-11 DIAGNOSIS — J210 Acute bronchiolitis due to respiratory syncytial virus: Secondary | ICD-10-CM | POA: Diagnosis not present

## 2022-09-11 LAB — CBC WITH DIFFERENTIAL/PLATELET
Abs Immature Granulocytes: 0.02 10*3/uL (ref 0.00–0.07)
Basophils Absolute: 0 10*3/uL (ref 0.0–0.1)
Basophils Relative: 0 %
Eosinophils Absolute: 0.1 10*3/uL (ref 0.0–0.5)
Eosinophils Relative: 2 %
HCT: 50 % (ref 39.0–52.0)
Hemoglobin: 16.6 g/dL (ref 13.0–17.0)
Immature Granulocytes: 0 %
Lymphocytes Relative: 20 %
Lymphs Abs: 1.4 10*3/uL (ref 0.7–4.0)
MCH: 33.7 pg (ref 26.0–34.0)
MCHC: 33.2 g/dL (ref 30.0–36.0)
MCV: 101.4 fL — ABNORMAL HIGH (ref 80.0–100.0)
Monocytes Absolute: 1 10*3/uL (ref 0.1–1.0)
Monocytes Relative: 14 %
Neutro Abs: 4.4 10*3/uL (ref 1.7–7.7)
Neutrophils Relative %: 64 %
Platelets: 148 10*3/uL — ABNORMAL LOW (ref 150–400)
RBC: 4.93 MIL/uL (ref 4.22–5.81)
RDW: 13.1 % (ref 11.5–15.5)
WBC: 7 10*3/uL (ref 4.0–10.5)
nRBC: 0 % (ref 0.0–0.2)

## 2022-09-11 LAB — COMPREHENSIVE METABOLIC PANEL
ALT: 31 U/L (ref 0–44)
AST: 56 U/L — ABNORMAL HIGH (ref 15–41)
Albumin: 3.9 g/dL (ref 3.5–5.0)
Alkaline Phosphatase: 82 U/L (ref 38–126)
Anion gap: 14 (ref 5–15)
BUN: 35 mg/dL — ABNORMAL HIGH (ref 8–23)
CO2: 19 mmol/L — ABNORMAL LOW (ref 22–32)
Calcium: 9.2 mg/dL (ref 8.9–10.3)
Chloride: 97 mmol/L — ABNORMAL LOW (ref 98–111)
Creatinine, Ser: 1.71 mg/dL — ABNORMAL HIGH (ref 0.61–1.24)
GFR, Estimated: 42 mL/min — ABNORMAL LOW (ref >60)
Glucose, Bld: 109 mg/dL — ABNORMAL HIGH (ref 70–99)
Potassium: 4 mmol/L (ref 3.5–5.1)
Sodium: 130 mmol/L — ABNORMAL LOW (ref 135–145)
Total Bilirubin: 2 mg/dL — ABNORMAL HIGH (ref 0.3–1.2)
Total Protein: 8.5 g/dL — ABNORMAL HIGH (ref 6.5–8.1)

## 2022-09-11 LAB — RESP PANEL BY RT-PCR (RSV, FLU A&B, COVID)  RVPGX2
Influenza A by PCR: NEGATIVE
Influenza B by PCR: NEGATIVE
Resp Syncytial Virus by PCR: POSITIVE — AB
SARS Coronavirus 2 by RT PCR: NEGATIVE

## 2022-09-11 MED ORDER — METOPROLOL SUCCINATE ER 25 MG PO TB24: 25.0000 mg | ORAL_TABLET | Freq: Every day | ORAL | 0 refills | Status: DC

## 2022-09-11 MED ORDER — BENZONATATE 100 MG PO CAPS: 100.0000 mg | ORAL_CAPSULE | Freq: Three times a day (TID) | ORAL | 0 refills | Status: DC

## 2022-09-11 MED ORDER — ALBUTEROL SULFATE HFA 108 (90 BASE) MCG/ACT IN AERS: 2.0000 | INHALATION_SPRAY | RESPIRATORY_TRACT | Status: DC | PRN

## 2022-09-11 MED ORDER — RIVAROXABAN 15 MG PO TABS: 15.0000 mg | ORAL_TABLET | Freq: Every day | ORAL | 0 refills | Status: DC

## 2022-09-11 MED ORDER — ONDANSETRON 4 MG PO TBDP: ORAL_TABLET | ORAL | 0 refills | Status: DC

## 2022-09-11 MED ORDER — IPRATROPIUM-ALBUTEROL 0.5-2.5 (3) MG/3ML IN SOLN
3.0000 mL | Freq: Once | RESPIRATORY_TRACT | Status: AC
  Administered 2022-09-11: 3 mL via RESPIRATORY_TRACT

## 2022-09-11 MED ORDER — SODIUM CHLORIDE 0.9 % IV BOLUS
1000.0000 mL | Freq: Once | INTRAVENOUS | Status: AC
  Administered 2022-09-11: 1000 mL via INTRAVENOUS

## 2022-09-11 MED ORDER — IPRATROPIUM-ALBUTEROL 0.5-2.5 (3) MG/3ML IN SOLN
RESPIRATORY_TRACT | Status: AC
  Filled 2022-09-11: qty 3

## 2022-09-11 NOTE — Discharge Instructions (Signed)
Take tylenol 2 pills 4 times a day.  Drink plenty of fluids.  Return for worsening shortness of breath, headache, confusion. Follow up with your family doctor.   You also have new onset atrial fibrillation.  Please take the medications as prescribed.  Please follow-up with cardiology office.

## 2022-09-11 NOTE — ED Triage Notes (Signed)
Shortness of breath , pt unable to speak full sentence.  Cough and congestion x 1 week .  Tested for resp panels , all negative .

## 2022-09-11 NOTE — ED Provider Notes (Signed)
[facility] Provider Note   CSN: [id number] Arrival date & time: 09/11/22  1517     History  Chief Complaint  Patient presents with   Shortness of Breath    Logan Patrick is a 72 y.o. male.  72 yo M with a chief complaint of cough and congestion.  This been going on for about a week.  Not sure of any specific sick contacts.  No history of asthma no history of COPD and no fevers.  He has been able to eat and drink.  Has been taking Mucinex but without improvement.   Shortness of Breath      Home Medications Prior to Admission medications   Medication Sig Start Date End Date Taking? Authorizing Provider  benzonatate (TESSALON) 100 MG capsule Take 1 capsule (100 mg total) by mouth every 8 (eight) hours. 09/11/22  Yes Deno Etienne, DO  metoprolol succinate (TOPROL-XL) 25 MG 24 hr tablet Take 1 tablet (25 mg total) by mouth daily. 09/11/22 10/11/22 Yes Deno Etienne, DO  ondansetron (ZOFRAN-ODT) 4 MG disintegrating tablet 63m ODT q4 hours prn nausea/vomit 09/11/22  Yes FDeno Etienne DO  Rivaroxaban (XARELTO) 15 MG TABS tablet Take 1 tablet (15 mg total) by mouth daily with supper. 09/11/22  Yes FDeno Etienne DO  acetaminophen (TYLENOL) 325 MG tablet Take by mouth every 6 (six) hours as needed for mild pain or moderate pain.    [provider]  Amino Acids-Protein Hydrolys (FEEDING SUPPLEMENT, PRO-STAT SUGAR FREE 64,) LIQD Take 30 mLs by mouth 2 (two) times daily. Patient not taking: Reported on 01/30/2018 12/05/17   DEdwin Dada MD  calcium citrate-vitamin D (CITRACAL+D) 315-200 MG-UNIT tablet Take 2 tablets by mouth 2 (two) times daily. Lunch and dinner 01/05/18   [provider]  clotrimazole (MYCELEX) 10 MG troche Take 10 mg by mouth 2 (two) times daily. 12/23/17   [provider]  Darbepoetin Alfa (ARANESP) 150 MCG/0.3ML SOSY injection Inject 0.3 mLs (150 mcg total) into the skin every Monday at 6 PM. Patient not  taking: Reported on 01/30/2018 12/12/17   DEdwin Dada MD  diphenhydrAMINE (BENADRYL) 12.5 MG/5ML elixir Take 5 mLs (12.5 mg total) by mouth every 6 (six) hours as needed for itching or allergies. Patient not taking: Reported on 01/30/2018 12/05/17   DEdwin Dada MD  furosemide (LASIX) 10 MG/ML injection Inject 4 mLs (40 mg total) into the vein daily. Patient not taking: Reported on 01/30/2018 12/06/17   DEdwin Dada MD  insulin aspart (NOVOLOG) 100 UNIT/ML injection Inject 10 Units into the skin daily. With Breakfast    [provider]  ipratropium-albuterol (DUONEB) 0.5-2.5 (3) MG/3ML SOLN Inhale 3 mLs into the lungs every 6 (six) hours as needed. Patient not taking: Reported on 01/30/2018 12/05/17   DEdwin Dada MD  lactulose (CHRONULAC) 10 GM/15ML solution Take 45 mLs (30 g total) by mouth 2 (two) times daily. Patient not taking: Reported on 01/30/2018 12/05/17   DEdwin Dada MD  magnesium oxide (MAG-OX) 400 (241.3 Mg) MG tablet Take 1 tablet (400 mg total) by mouth daily. Patient taking differently: Take 400 mg by mouth 2 (two) times daily.  12/06/17   Danford, CSuann Larry MD  Melatonin 3 MG TBDP Take 6 mg by mouth at bedtime. 01/24/18   [provider]  midodrine (PROAMATINE) 10 MG tablet Take 1 tablet (10 mg total) by mouth 3 (three) times daily with meals. Patient not taking: Reported on  01/30/2018 12/06/17   Danford, Suann Larry, MD  mirtazapine (REMERON) 7.5 MG tablet Take 7.5 mg by mouth at bedtime. 01/05/18 01/05/19  [provider]  mycophenolate (CELLCEPT) 250 MG capsule Take 750 mg by mouth every 12 (twelve) hours. 0900 and 2100 01/27/18   [provider]  octreotide 500 mcg in sodium chloride 0.9 % 250 mL Inject 50 mcg/hr into the vein continuous. Patient not taking: Reported on 01/30/2018 12/05/17   Edwin Dada, MD  ondansetron [facility]) 4 MG/2ML SOLN injection Inject 2 mLs (4 mg total) into the vein  every 6 (six) hours as needed for nausea or vomiting. Patient not taking: Reported on 01/30/2018 12/05/17   Edwin Dada, MD  oxyCODONE (OXY IR/ROXICODONE) 5 MG immediate release tablet Take 1 tablet (5 mg total) by mouth every 6 (six) hours as needed for moderate pain. Patient not taking: Reported on 01/30/2018 12/05/17   Edwin Dada, MD  pantoprazole (PROTONIX) 20 MG tablet Take 20 mg by mouth daily.     [provider]  polyethylene glycol (MIRALAX / GLYCOLAX) packet Take 17 g by mouth 2 (two) times daily. Patient not taking: Reported on 01/30/2018 12/05/17   Edwin Dada, MD  potassium chloride SA (K-DUR,KLOR-CON) 20 MEQ tablet Take 2 tablets (40 mEq total) by mouth daily. Patient not taking: Reported on 01/30/2018 12/06/17   Edwin Dada, MD  predniSONE (DELTASONE) 10 MG tablet Take 10 mg by mouth daily with breakfast.    [provider]  sulfamethoxazole-trimethoprim (BACTRIM DS,SEPTRA DS) 800-160 MG tablet Take 1 tablet by mouth every Monday, Wednesday, and Friday. 02/15/18   [provider]  tacrolimus (PROGRAF) 1 MG capsule Take 1 mg by mouth every 12 (twelve) hours. 01/06/18   [provider]  valGANciclovir (VALCYTE) 450 MG tablet Take 900 mg by mouth every evening. 12/23/17   [provider]      Allergies    Patient has no known allergies.    Review of Systems   Review of Systems  Respiratory:  Positive for shortness of breath.     Physical Exam Updated Vital Signs BP (!) 145/96   Pulse 93   Temp 97.6 F (36.4 C) (Oral)   Resp 17   Wt 106.6 kg   SpO2 95%   BMI 33.72 kg/m  Physical Exam Vitals and nursing note reviewed.  Constitutional:      Appearance: He is well-developed.  HENT:     Head: Normocephalic and atraumatic.  Eyes:     Pupils: Pupils are equal, round, and reactive to light.  Neck:     Vascular: No JVD.  Cardiovascular:     Rate and Rhythm: Normal rate and regular rhythm.      Heart sounds: No murmur heard.    No friction rub. No gallop.  Pulmonary:     Effort: No respiratory distress.     Breath sounds: No wheezing.     Comments: Diffuse rhonchi versus transmitted upper airway noises.  Prolonged expiratory effort. Abdominal:     General: There is no distension.     Tenderness: There is no abdominal tenderness. There is no guarding or rebound.  Musculoskeletal:        General: Normal range of motion.     Cervical back: Normal range of motion and neck supple.  Skin:    Coloration: Skin is not pale.     Findings: No rash.  Neurological:     Mental Status: He is alert and oriented  to person, place, and time.  Psychiatric:        Behavior: Behavior normal.     ED Results / Procedures / Treatments   Labs (all labs ordered are listed, but only abnormal results are displayed) Labs Reviewed  RESP PANEL BY RT-PCR (RSV, FLU A&B, COVID)  RVPGX2 - Abnormal; Notable for the following components:      Result Value   Resp Syncytial Virus by PCR POSITIVE (*)    All other components within normal limits  CBC WITH DIFFERENTIAL/PLATELET - Abnormal; Notable for the following components:   MCV 101.4 (*)    Platelets 148 (*)    All other components within normal limits  COMPREHENSIVE METABOLIC PANEL - Abnormal; Notable for the following components:   Sodium 130 (*)    Chloride 97 (*)    CO2 19 (*)    Glucose, Bld 109 (*)    BUN 35 (*)    Creatinine, Ser 1.71 (*)    Total Protein 8.5 (*)    AST 56 (*)    Total Bilirubin 2.0 (*)    GFR, Estimated 42 (*)    All other components within normal limits    EKG EKG Interpretation  Date/Time:  Saturday September 11 2022 16:10:51 EST Ventricular Rate:  145 PR Interval:    QRS Duration: 84 QT Interval:  312 QTC Calculation: 485 R Axis:   67 Text Interpretation: Atrial flutter with predominant 2:1 AV block Borderline prolonged QT interval Otherwise no significant change Confirmed by Deno Etienne [phone]) on 09/11/2022  4:13:24 PM  Radiology DG Chest 2 View  Result Date: 09/11/2022 CLINICAL DATA:  72 year old male with history of shortness of breath. Cough and congestion for 1 week. EXAM: CHEST - 2 VIEW COMPARISON:  Chest x-ray 01/30/2018. FINDINGS: Lung volumes are normal. No consolidative airspace disease. No pleural effusions. No pneumothorax. No pulmonary nodule or mass noted. Pulmonary vasculature and the cardiomediastinal silhouette are within normal limits. IMPRESSION: No radiographic evidence of acute cardiopulmonary disease. Electronically Signed   By: Vinnie Langton M.D.   On: 09/11/2022 15:56    Procedures .Critical Care  Performed by: Deno Etienne, DO Authorized by: Deno Etienne, DO   Critical care provider statement:    Critical care time (minutes):  35   Critical care time was exclusive of:  Separately billable procedures and treating other patients   Critical care was time spent personally by me on the following activities:  Development of treatment plan with patient or surrogate, discussions with consultants, evaluation of patient's response to treatment, examination of patient, ordering and review of laboratory studies, ordering and review of radiographic studies, ordering and performing treatments and interventions, pulse oximetry, re-evaluation of patient's condition and review of old charts   Care discussed with: admitting provider       Medications Ordered in ED Medications  ipratropium-albuterol (DUONEB) 0.5-2.5 (3) MG/3ML nebulizer solution 3 mL (3 mLs Nebulization Not Given 09/11/22 1550)  sodium chloride 0.9 % bolus 1,000 mL (0 mLs Intravenous Stopped 09/11/22 1711)    ED Course/ Medical Decision Making/ A&P                             Medical Decision Making Amount and/or Complexity of Data Reviewed Labs: ordered. Radiology: ordered.  Risk Prescription drug management.   72 yo M with a chief complaints of difficulty breathing.  He tells me that he is caught a cold.  Has  had it for  about a week.  Has been trying to take Mucinex but without much improvement at home.  He does have some prolonged expiratory effort will trial a DuoNeb here.  Chest x-ray to assess for pneumonia.  Tachycardic on arrival will assess with blood work.  Bolus of IV fluids.  Reassess.  Patient does have a history of pulmonary embolism and thinks this feels completely different.  Patient was found to have RSV, also was newly found to be in atrial fibrillation with RVR.  He had significant improvement of his rate with IV fluids.  He is feeling a bit better on repeat assessment.  Chest x-ray independently interpreted by me without focal infiltrate or pneumothorax.  No significant anemia.  He does have an AKI when I compared his labs to his labs done at [facility] about 4 months ago or so.  I discussed the results with him.  I offered admission.  Patient is declining at this time.  Tells me he feels well to go home.  Will start him on metoprolol.  Have him push fluids.  Started on Xarelto.  Encouraged him to follow-up quickly with A-fib clinic.  Also put a referral into cardiology.  5:16 PM:  I have discussed the diagnosis/risks/treatment options with the patient.  Evaluation and diagnostic testing in the emergency department does not suggest an emergent condition requiring admission or immediate intervention beyond what has been performed at this time.  They will follow up with PCP, Cards. We also discussed returning to the ED immediately if new or worsening sx occur. We discussed the sx which are most concerning (e.g., sudden worsening pain, fever, inability to tolerate by mouth) that necessitate immediate return. Medications administered to the patient during their visit and any new prescriptions provided to the patient are listed below.  Medications given during this visit Medications  ipratropium-albuterol (DUONEB) 0.5-2.5 (3) MG/3ML nebulizer solution 3 mL (3 mLs Nebulization Not Given 09/11/22 1550)   sodium chloride 0.9 % bolus 1,000 mL (0 mLs Intravenous Stopped 09/11/22 1711)     The patient appears reasonably screen and/or stabilized for discharge and I doubt any other medical condition or other [facility] requiring further screening, evaluation, or treatment in the ED at this time prior to discharge.          Final Clinical Impression(s) / ED Diagnoses Final diagnoses:  RSV (acute bronchiolitis due to respiratory syncytial virus)  Atrial fibrillation with rapid ventricular response ([place])    Rx / DC Orders ED Discharge Orders          Ordered    metoprolol succinate (TOPROL-XL) 25 MG 24 hr tablet  Daily        09/11/22 1706    Rivaroxaban (XARELTO) 15 MG TABS tablet  Daily with supper        09/11/22 1706    Ambulatory referral to Cardiology       Comments: If you have not heard from the Cardiology office within the next 72 hours please call [phone].   09/11/22 1706    benzonatate (TESSALON) 100 MG capsule  Every 8 hours        09/11/22 1706    ondansetron (ZOFRAN-ODT) 4 MG disintegrating tablet        09/11/22 1706              Deno Etienne, DO 09/11/22 1716

## 2022-09-11 NOTE — ED Notes (Signed)
Pt ambulatory from imaging to pt room, steady gait.   RT at bedside.

## 2022-10-21 ENCOUNTER — Ambulatory Visit: Payer: PPO | Attending: Cardiology

## 2022-10-21 ENCOUNTER — Ambulatory Visit: Payer: PPO | Attending: Cardiology | Admitting: Cardiology

## 2022-10-21 ENCOUNTER — Encounter: Payer: Self-pay | Admitting: Cardiology

## 2022-10-21 VITALS — BP 128/84 | HR 72 | Ht 70.0 in | Wt 237.0 lb

## 2022-10-21 DIAGNOSIS — Z86711 Personal history of pulmonary embolism: Secondary | ICD-10-CM | POA: Diagnosis not present

## 2022-10-21 DIAGNOSIS — I4892 Unspecified atrial flutter: Secondary | ICD-10-CM

## 2022-10-21 DIAGNOSIS — R0609 Other forms of dyspnea: Secondary | ICD-10-CM

## 2022-10-21 DIAGNOSIS — I471 Supraventricular tachycardia, unspecified: Secondary | ICD-10-CM

## 2022-10-21 DIAGNOSIS — E8801 Alpha-1-antitrypsin deficiency: Secondary | ICD-10-CM

## 2022-10-21 DIAGNOSIS — Z944 Liver transplant status: Secondary | ICD-10-CM

## 2022-10-21 MED ORDER — RIVAROXABAN 15 MG PO TABS: 15.0000 mg | ORAL_TABLET | Freq: Every day | ORAL | 3 refills | Status: DC

## 2022-10-21 NOTE — Progress Notes (Signed)
Cardiology Consultation:    Date:  10/21/2022   ID:  Logan Patrick, DOB [birth year], MRN [id number]  PCP:  [name], MD  Cardiologist:  Jenne Campus, MD   Referring MD: Deno Etienne, DO   No chief complaint on file. I have atrial flutter  History of Present Illness:    Logan Patrick is a 72 y.o. male who is being seen today for the evaluation of atrial flutter at the request of Deno Etienne, DO.  Past medical history significant for alcoholic cirrhosis of the liver, alpha 1 antitrypsin deficiency, status post liver transplant 5 years ago, COPD, hypertension, history of DVT and PE recently about a month ago he showed up in the emergency room feeling sick he was find to have upper respite tract infection, he was also noted to be tachycardic with narrow complex careful analysis of EKG revealed presence of atrial flutter.  He never had atrial flutter before.  He was put on small dose of beta-blocker as well as Xarelto 15 mg and he was sent home since that time he recovered from his upper respiratory tract infection and overall doing very well.  Denies have any chest pain tightness squeezing pressure burning chest, he is in normal sinus rhythm today  Past Medical History:  Diagnosis Date   Acute deep vein thrombosis (DVT) of both lower extremities ([place]) 02/01/2018   Last Assessment & Plan: Formatting of this note might be different from the original. 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 Patient had been on metoprolol 12.5 mg PO Q12H, but this was discontinued in the setting of persistent dizziness/orthostatic hypotension. - Continue to monitor BP and symptoms of dizziness/hypertension/hypotension in outpatient setting   Hypertension    Hypotension 11/21/2017   Liver failure ([place])    Pneumonia    Pulmonary embolism ([place]) 01/30/2018   Last Assessment & Plan: Formatting of this note might be different from the original. Pt recently admitted 7/8-7/12/19 with an acute PE and bilat DVTs. 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   b complex vitamins capsule Take 1 capsule by mouth daily.   calcium citrate-vitamin D (CITRACAL+D) 315-200 MG-UNIT tablet Take 2 tablets by mouth 2 (two) times daily. Lunch and dinner   metoprolol succinate (TOPROL-XL) 25  MG 24 hr tablet Take 1 tablet (25 mg total) by mouth daily. (Patient taking differently: Take 12.5 mg by mouth 2 (two) times daily.)   mycophenolate (CELLCEPT) 250 MG capsule Take 750 mg by mouth every 12 (twelve) hours. 0900 and 2100   patiromer (VELTASSA) 8.4 g packet Take 8.4 g by mouth at bedtime.   Specialty Vitamins Products (MG PLUS PROTEIN) 133 MG TABS Take 133 mg by mouth 2 (two) times daily.   tacrolimus (PROGRAF) 1 MG capsule Take 1 mg by mouth every 12 (twelve) hours.      Allergies:   Patient has no known allergies.   Social History   Socioeconomic History   Marital status: Divorced    Spouse name: Not on file   Number of children: Not on file   Years of education: Not on file   Highest education level: Not on file  Occupational History   Not on file  Tobacco Use   Smoking status: Former    Packs/day: 0.00    Years: 0.00    Additional pack years: 0.00    Total pack years: 0.00    Types: Cigarettes    Quit date: 11/30/1976    Years since quitting: 45.9   Smokeless tobacco: Never  Substance and Sexual Activity   Alcohol use: No   Drug use: No   Sexual activity: Not Currently    Birth control/protection: None  Other Topics Concern   Not on file  Social History Narrative   Not on file   Social Determinants of Health   Financial Resource Strain: Not on file  Food Insecurity: Not on file  Transportation Needs: Not on file  Physical Activity: Not on file  Stress: Not on file  Social Connections: Not on file     Family History: The patient's family history includes CVA (age of onset: 82) in his father; Diabetes type II (age of onset: 60) in his mother; Heart disease in his mother. ROS:   Please see the history of present illness.    All 14 point review of systems negative except as described per history of present illness.  EKGs/Labs/Other Studies Reviewed:    The following studies were reviewed today: I did review did review blood test from the hospital  EKG:  EKG is  ordered today.  The ekg ordered today demonstrates normal sinus rhythm first-degree AV block normal QRS complex duration morphology  Recent Labs: 09/11/2022: ALT 31; BUN 35; Creatinine, Ser 1.71; Hemoglobin 16.6; Platelets 148; Potassium 4.0; Sodium 130  Recent Lipid Panel No results found for: "CHOL", "TRIG", "HDL", "CHOLHDL", "VLDL", "LDLCALC", "LDLDIRECT"  Physical Exam:    VS:  BP 128/84 (BP Location: Left Arm, Patient Position: Sitting)   Pulse 72   Ht 5'  10" (1.778 m)   Wt 237 lb (107.5 kg)   SpO2 97%   BMI 34.01 kg/m     Wt Readings from Last 3 Encounters:  10/21/22 237 lb (107.5 kg)  09/11/22 235 lb (106.6 kg)  01/30/18 207 lb (93.9 kg)     GEN:  Well nourished, well developed in no acute distress HEENT: Normal NECK: No JVD; No carotid bruits LYMPHATICS: No lymphadenopathy CARDIAC: RRR, no murmurs, no rubs, no gallops RESPIRATORY:  Clear to auscultation without rales, wheezing or rhonchi  ABDOMEN: Soft, non-tender, non-distended MUSCULOSKELETAL:  No edema; No deformity  SKIN: Warm and dry NEUROLOGIC:  Alert and oriented x 3 PSYCHIATRIC:  Normal affect   ASSESSMENT:    1. Paroxysmal atrial flutter (  [place])   2. Supraventricular tachycardia   3. History of pulmonary embolism   4. Alpha-1-antitrypsin deficiency ([place])   5. S/P liver transplant ([place])    PLAN:    In order of problems listed above:  Paroxysmal atrial flutter, this is first documented episode of atrial flutter.  His CHADS2 Vascor equals 2 on top of that he does have history of DVT PE but did look like it was provoked after his liver transplant.  He is anticoagulated Xarelto 15 have no difficulty doing it.  Dose is reduced because of lower GFR.  I will extend the prescription for him for this medication.  He was also given metoprolol while in the hospital and I will continue with that medication.  I will ask him to have Zio patch for 2 weeks to see if he had any recurrences of this arrhythmia.  I will also schedule him to have an echocardiogram to assess left ventricular ejection fraction and more reportedly size of the atria's. History of supraventricular tachycardia hopefully this will be suppressed with beta-blocker. History of pulmonary emboli as well as DVT, he will be anticoagulated for now for his paroxysmal atrial flutter.  However that DVT PE look like it was provoked. Status post liver transplant followed by transplant team from [facility].   Medication  Adjustments/Labs and Tests Ordered: Current medicines are reviewed at length with the patient today.  Concerns regarding medicines are outlined above.  No orders of the defined types were placed in this encounter.  No orders of the defined types were placed in this encounter.   Signed, Park Liter, MD, [facility]. 10/21/2022 3:10 PM    [facility]

## 2022-10-21 NOTE — Patient Instructions (Signed)
Medication Instructions:  Your physician recommends that you continue on your current medications as directed. Please refer to the Current Medication list given to you today.  *If you need a refill on your cardiac medications before your next appointment, please call your pharmacy*   Lab Work: None Ordered If you have labs (blood work) drawn today and your tests are completely normal, you will receive your results only by: MyChart Message (if you have MyChart) OR A paper copy in the mail If you have any lab test that is abnormal or we need to change your treatment, we will call you to review the results.   Testing/Procedures: Your physician has requested that you have an echocardiogram. Echocardiography is a painless test that uses sound waves to create images of your heart. It provides your doctor with information about the size and shape of your heart and how well your heart's chambers and valves are working. This procedure takes approximately one hour. There are no restrictions for this procedure. Please do NOT wear cologne, perfume, aftershave, or lotions (deodorant is allowed). Please arrive 15 minutes prior to your appointment time.   WHY IS MY DOCTOR PRESCRIBING ZIO? The Zio system is proven and trusted by physicians to detect and diagnose irregular heart rhythms -- and has been prescribed to hundreds of thousands of patients.  The FDA has cleared the Zio system to monitor for many different kinds of irregular heart rhythms. In a study, physicians were able to reach a diagnosis 90% of the time with the Zio system1.  You can wear the Zio monitor -- a small, discreet, comfortable patch -- during your normal day-to-day activity, including while you sleep, shower, and exercise, while it records every single heartbeat for analysis.  1Barrett, P., et al. Comparison of 24 Hour Holter Monitoring Versus 14 Day Novel Adhesive Patch Electrocardiographic Monitoring. American Journal of Medicine,  2014.  ZIO VS. HOLTER MONITORING The Zio monitor can be comfortably worn for up to 14 days. Holter monitors can be worn for 24 to 48 hours, limiting the time to record any irregular heart rhythms you may have. Zio is able to capture data for the 51% of patients who have their first symptom-triggered arrhythmia after 48 hours.1  LIVE WITHOUT RESTRICTIONS The Zio ambulatory cardiac monitor is a small, unobtrusive, and water-resistant patch--you might even forget you're wearing it. The Zio monitor records and stores every beat of your heart, whether you're sleeping, working out, or showering.      Follow-Up: At [facility], you and your health needs are our priority.  As part of our continuing mission to provide you with exceptional heart care, we have created designated Provider Care Teams.  These Care Teams include your primary Cardiologist (physician) and Advanced Practice Providers (APPs -  Physician Assistants and Nurse Practitioners) who all work together to provide you with the care you need, when you need it.  We recommend signing up for the patient portal called "MyChart".  Sign up information is provided on this After Visit Summary.  MyChart is used to connect with patients for Virtual Visits (Telemedicine).  Patients are able to view lab/test results, encounter notes, upcoming appointments, etc.  Non-urgent messages can be sent to your provider as well.   To learn more about what you can do with MyChart, go to [email or web address].com.    Your next appointment:   3 month(s)  The format for your next appointment:   In Person  Provider:   Robert Krasowski, MD      Other Instructions NA  

## 2022-11-11 ENCOUNTER — Ambulatory Visit (HOSPITAL_BASED_OUTPATIENT_CLINIC_OR_DEPARTMENT_OTHER)
Admission: RE | Admit: 2022-11-11 | Discharge: 2022-11-11 | Disposition: A | Discharge status: Home / Self Care | Payer: PPO | Source: Ambulatory Visit | Attending: Cardiology | Admitting: Cardiology

## 2022-11-11 DIAGNOSIS — R0609 Other forms of dyspnea: Secondary | ICD-10-CM | POA: Diagnosis not present

## 2022-11-11 LAB — ECHOCARDIOGRAM COMPLETE
AR max vel: 2.69 cm2
AV Area VTI: 2.75 cm2
AV Area mean vel: 2.59 cm2
AV Mean grad: 7.4 mmHg
AV Peak grad: 12.6 mmHg
Ao pk vel: 1.78 m/s
Area-P 1/2: 3.68 cm2
P 1/2 time: 447 msec
S' Lateral: 2.9 cm

## 2022-11-18 ENCOUNTER — Telehealth: Payer: Self-pay

## 2022-11-18 NOTE — Telephone Encounter (Signed)
Left message on My Chart with normal results per Dr. Krasowski's note. Routed to PCP. 

## 2022-11-19 ENCOUNTER — Telehealth: Payer: Self-pay

## 2022-11-19 NOTE — Telephone Encounter (Signed)
-----   Message from Georgeanna Lea, MD sent at 11/17/2022  8:15 PM EDT ----- Echocardiogram showed normal left ventricle ejection fraction, mildly enlarged left atrium, overall looks good

## 2022-11-19 NOTE — Telephone Encounter (Signed)
Patient notified of results.

## 2022-11-26 ENCOUNTER — Telehealth: Payer: Self-pay

## 2022-11-26 NOTE — Telephone Encounter (Signed)
Patient notified of results.

## 2022-11-26 NOTE — Telephone Encounter (Signed)
-----   Message from Georgeanna Lea, MD sent at 11/25/2022  8:30 AM EDT ----- Monitor showed 1 run of ventricular tachycardia, however left ventricle ejection fraction is preserved based on echo so we do not continue present management

## 2023-10-12 ENCOUNTER — Other Ambulatory Visit: Payer: Self-pay | Admitting: Cardiology

## 2023-10-12 DIAGNOSIS — I4892 Unspecified atrial flutter: Secondary | ICD-10-CM

## 2023-10-13 NOTE — Telephone Encounter (Signed)
 Prescription refill request for Xarelto received.  Indication: Aflutter/DVT/PE  Last office visit: 10/21/22 Logan Patrick)  Weight: 107.5kg Age: 73 Scr: 1.29 (08/04/23)  CrCl: 78.65ml/min  Per office note on 10/21/22: He is anticoagulated Xarelto 15 have no difficulty doing it. Dose is reduced because of lower GFR.   Refill sent.

## 2024-04-04 ENCOUNTER — Other Ambulatory Visit: Payer: Self-pay | Admitting: Cardiology

## 2024-04-04 DIAGNOSIS — I4892 Unspecified atrial flutter: Secondary | ICD-10-CM

## 2024-04-04 NOTE — Telephone Encounter (Signed)
 Prescription refill request for Xarelto  received.  Indication:afib Last office visit:needs appt Weight:107.5  kg Age:73 Scr:na CrCl:na  Prescription refilled

## 2024-05-02 ENCOUNTER — Other Ambulatory Visit: Payer: Self-pay | Admitting: Cardiology

## 2024-05-02 DIAGNOSIS — I4892 Unspecified atrial flutter: Secondary | ICD-10-CM

## 2024-05-10 ENCOUNTER — Encounter: Payer: Self-pay | Admitting: Cardiology

## 2024-05-10 ENCOUNTER — Ambulatory Visit: Attending: Cardiology | Admitting: Cardiology

## 2024-05-10 VITALS — BP 130/70 | HR 92 | Ht 70.0 in | Wt 234.0 lb

## 2024-05-10 DIAGNOSIS — I4892 Unspecified atrial flutter: Secondary | ICD-10-CM | POA: Diagnosis not present

## 2024-05-10 DIAGNOSIS — Z944 Liver transplant status: Secondary | ICD-10-CM | POA: Diagnosis not present

## 2024-05-10 NOTE — Patient Instructions (Signed)

## 2024-05-10 NOTE — Progress Notes (Signed)
 Cardiology Office Note:    Date:  05/10/2024   ID:  Logan Patrick, DOB 10-17-1950, MRN [id number]  PCP:  Tanda Prentice VEAR, MD  Cardiologist:  Lamar Fitch, MD    Referring MD: Tanda Prentice VEAR, MD   No chief complaint on file. Doing well  History of Present Illness:    Logan Patrick is a 73 y.o. male past medical history significant for atrial flutter, alcoholic cirrhosis of the liver with alpha-1 antitrypsin deficiency, status post leaving 12.6 years ago, COPD, hypertension, history of DVT and PE, he was referred to me initially at the beginning of the year because of episode of atrial flutter it happened when he was sick overall with some common cold.  He was noted to have tachycardia EKG showed narrow complex tachycardia atrial flutter.  He is being already taking anticoagulation which we will continue.  Since that time no more recurrences of this arrhythmia.  He said he felt clearly when he had atrial flutter before none since the time that he had first episode.  We did put a monitor on him shows some runs of supraventricular tachycardia but nothing striking, echocardiogram showed preserved ejection fraction mildly enlarged 8 years.  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  Past Medical History:  Diagnosis Date   Acute deep vein thrombosis (DVT) of both lower extremities (HCC) 02/01/2018   Last Assessment & Plan: Formatting of this note might be different from the original. - h/o PE (7/8), BL LE DVT (7/9) - IVC placed 7/10 by IR - On Lovenox 100mg  Q12H at home - On admission - lovenox held, hep gtt started (7/17/-7/19) - Hep level at 0.42 yesterday - subtherapeutic - Lovenox increased to 80mg  BID   Acute renal insufficiency 12/08/2012   Alcoholic cirrhosis of liver (HCC)    Alcoholic cirrhosis of liver without ascites (HCC) 12/01/2017   Alpha-1-antitrypsin deficiency (HCC) 06/24/2020   Bronchitis 11/30/2012   Cirrhosis of  liver (HCC) 11/30/2012   CMV colitis (HCC) 01/16/2020   Encephalopathy, hepatic (HCC) 12/10/2012   Essential hypertension 12/11/2015   Last Assessment & Plan: Formatting of this note might be different from the original. Patient had been on metoprolol  12.5 mg PO Q12H, but this was discontinued in the setting of persistent dizziness/orthostatic hypotension. - Continue to monitor BP and symptoms of dizziness/hypertension/hypotension in outpatient setting   Hypertension    Hypotension 11/21/2017   Liver failure (HCC)    Pneumonia    Pulmonary embolism (HCC) 01/30/2018   Last Assessment & Plan: Formatting of this note might be different from the original. Pt recently admitted 7/8-7/12/19 with an acute PE and bilat DVTs. 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   b complex vitamins capsule Take 1 capsule by mouth daily.   calcium citrate-vitamin D (CITRACAL+D) 315-200 MG-UNIT tablet Take 2 tablets by mouth 2 (two) times daily. Lunch and dinner  metoprolol  succinate (TOPROL -XL) 25 MG 24 hr tablet Take 12.5 mg by mouth 2 (two) times daily.   mycophenolate (CELLCEPT) 250 MG capsule Take 750 mg by mouth every 12 (twelve) hours. 0900 and 2100   patiromer (VELTASSA) 8.4 g packet Take 8.4 g by mouth at bedtime.   Rivaroxaban  (XARELTO ) 15 MG TABS tablet Take 1 tablet (15 mg total) by mouth daily with supper.   Specialty Vitamins Products (MG PLUS PROTEIN) 133 MG TABS Take 133 mg by mouth 2  (two) times daily.   tacrolimus  (PROGRAF ) 0.5 MG capsule Take 0.5 mg by mouth as directed. 1 tablet in the am and 2 tabs at night   [DISCONTINUED] tacrolimus  (PROGRAF ) 1 MG capsule Take 1 mg by mouth every 12 (twelve) hours.     Allergies:   Patient has no known allergies.   Social History   Socioeconomic History   Marital status: Divorced    Spouse name: Not on file   Number of children: Not on file   Years of education: Not on file   Highest education level: Not on file  Occupational History   Not on file  Tobacco Use   Smoking status: Former    Current packs/day: 0.00    Types: Cigarettes    Quit date: 11/30/1976    Years since quitting: 47.4   Smokeless tobacco: Never  Substance and Sexual Activity   Alcohol use: No   Drug use: No   Sexual activity: Not Currently    Birth control/protection: None  Other Topics Concern   Not on file  Social History Narrative   Not on file   Social Drivers of Health   Financial Resource Strain: Low Risk  (06/17/2020)   Received from [facility] visits prior to 09/25/2022.   Overall Financial Resource Strain (CARDIA)    Difficulty of Paying Living Expenses: Not hard at all  Food Insecurity: Low Risk  (07/05/2023)   Received from [facility]   Hunger Vital Sign    Within the past 12 months, the food you bought just didn't last and you didn't have money to get more. : Never true    Within the past 12 months, you worried that your food would run out before you got money to buy more: Never true  Transportation Needs: No Transportation Needs (07/05/2023)   Received from [employer]    In the past 12 months, has lack of reliable transportation kept you from medical appointments, meetings, work or from getting things needed for daily living? : No  Physical Activity: Unknown (06/17/2020)   Received from [facility] visits prior to 09/25/2022.   Exercise Vital Sign    On average,  how many days per week do you engage in moderate to strenuous exercise (like a brisk walk)?: 7 days    Minutes of Exercise per Session: Not on file  Stress: No Stress Concern Present (06/17/2020)   Received from [facility] visits prior to 09/25/2022.   [employer] of Occupational Health - Occupational Stress Questionnaire    Feeling of Stress : Not at all  Social Connections: Unknown (12/07/2021)   Received from [employer]   Social Network    Social Network: Not on file     Family History: The patient's family history includes CVA (age of onset: 18) in his father; Diabetes type II (age of onset: 8) in his mother; Heart disease in his mother. ROS:  Please see the history of present illness.    All 14 point review of systems negative except as described per history of present illness  EKGs/Labs/Other Studies Reviewed:    EKG Interpretation Date/Time:  Thursday May 10 2024 09:34:02 EDT Ventricular Rate:  92 PR Interval:  232 QRS Duration:  80 QT Interval:  354 QTC Calculation: 437 R Axis:   59  Text Interpretation: Sinus rhythm with 1st degree A-V block When compared with ECG of 11-Sep-2022 16:10, PREVIOUS ECG IS PRESENT Confirmed by Bernie Charleston [phone]) on 05/10/2024 9:48:40 AM    Recent Labs: No results found for requested labs within last 365 days.  Recent Lipid Panel No results found for: CHOL, TRIG, HDL, CHOLHDL, VLDL, LDLCALC, LDLDIRECT  Physical Exam:    VS:  BP 130/70   Pulse 92   Ht 5' 10 (1.778 m)   Wt 234 lb (106.1 kg)   SpO2 96%   BMI 33.58 kg/m     Wt Readings from Last 3 Encounters:  05/10/24 234 lb (106.1 kg)  10/21/22 237 lb (107.5 kg)  09/11/22 235 lb (106.6 kg)     GEN:  Well nourished, well developed in no acute distress HEENT: Normal NECK: No JVD; No carotid bruits LYMPHATICS: No lymphadenopathy CARDIAC: RRR, no murmurs, no rubs, no gallops RESPIRATORY:  Clear to auscultation without  rales, wheezing or rhonchi  ABDOMEN: Soft, non-tender, non-distended MUSCULOSKELETAL:  No edema; No deformity  SKIN: Warm and dry LOWER EXTREMITIES: no swelling NEUROLOGIC:  Alert and oriented x 3 PSYCHIATRIC:  Normal affect   ASSESSMENT:    1. Paroxysmal atrial flutter (HCC)   2. S/P liver transplant (HCC)    PLAN:    In order of problems listed above:  Paroxysmal aflutter denies having any continue anticoagulation. Status post liver transplant follow-up by [facility]. History of deep venous thrombosis, PE anticoagulated which I will continue.   Medication Adjustments/Labs and Tests Ordered: Current medicines are reviewed at length with the patient today.  Concerns regarding medicines are outlined above.  Orders Placed This Encounter  Procedures   EKG 12-Lead   Medication changes: No orders of the defined types were placed in this encounter.   Signed, [place] DOROTHA Bernie, MD, [facility] 05/10/2024 10:00 AM    [facility]
